# Patient Record
Sex: Male | Born: 1935 | Race: White | Hispanic: No | Marital: Married | Smoking: Never smoker
Health system: Southern US, Community
[De-identification: ages and names within clinical notes are randomized; demographics above are authoritative.]

## PROBLEM LIST (undated history)

## (undated) DIAGNOSIS — R413 Other amnesia: Secondary | ICD-10-CM

## (undated) DIAGNOSIS — F32A Depression, unspecified: Secondary | ICD-10-CM

## (undated) DIAGNOSIS — D696 Thrombocytopenia, unspecified: Secondary | ICD-10-CM

## (undated) DIAGNOSIS — E785 Hyperlipidemia, unspecified: Secondary | ICD-10-CM

## (undated) DIAGNOSIS — F0390 Unspecified dementia without behavioral disturbance: Secondary | ICD-10-CM

## (undated) DIAGNOSIS — F329 Major depressive disorder, single episode, unspecified: Secondary | ICD-10-CM

## (undated) DIAGNOSIS — Z741 Need for assistance with personal care: Secondary | ICD-10-CM

## (undated) DIAGNOSIS — E039 Hypothyroidism, unspecified: Secondary | ICD-10-CM

## (undated) HISTORY — PX: RETINAL DETACHMENT SURGERY: SHX105

## (undated) HISTORY — PX: TONSILLECTOMY: SUR1361

## (undated) HISTORY — PX: CHOLECYSTECTOMY: SHX55

---

## 2004-06-26 ENCOUNTER — Ambulatory Visit (HOSPITAL_COMMUNITY): Admission: RE | Admit: 2004-06-26 | Discharge: 2004-06-26 | Payer: Self-pay | Admitting: Family Medicine

## 2004-12-25 ENCOUNTER — Emergency Department (HOSPITAL_COMMUNITY): Admission: EM | Admit: 2004-12-25 | Discharge: 2004-12-25 | Payer: Self-pay | Admitting: Emergency Medicine

## 2009-04-03 ENCOUNTER — Encounter (INDEPENDENT_AMBULATORY_CARE_PROVIDER_SITE_OTHER): Payer: Self-pay | Admitting: *Deleted

## 2009-04-29 ENCOUNTER — Ambulatory Visit: Payer: Self-pay | Admitting: Gastroenterology

## 2009-04-29 DIAGNOSIS — K921 Melena: Secondary | ICD-10-CM

## 2009-05-27 ENCOUNTER — Ambulatory Visit: Payer: Self-pay | Admitting: Gastroenterology

## 2009-05-27 ENCOUNTER — Ambulatory Visit (HOSPITAL_COMMUNITY): Admission: RE | Admit: 2009-05-27 | Discharge: 2009-05-27 | Payer: Self-pay | Admitting: Gastroenterology

## 2009-07-30 ENCOUNTER — Ambulatory Visit: Payer: Self-pay | Admitting: Gastroenterology

## 2009-07-30 DIAGNOSIS — D6489 Other specified anemias: Secondary | ICD-10-CM

## 2009-08-15 LAB — CONVERTED CEMR LAB
Ferritin: 15 ng/mL — ABNORMAL LOW (ref 22–322)
HCT: 40 % (ref 39.0–52.0)
Hemoglobin: 12.7 g/dL — ABNORMAL LOW (ref 13.0–17.0)

## 2010-07-29 NOTE — Assessment & Plan Note (Signed)
Summary: Iron Deficiency Anemia   Visit Type:  Follow-up Visit Primary Care Provider:  Dwana Brock, M.D.  Chief Complaint:  F/U anemia.  History of Present Illness: No BRBPR, blk tarry stools, or hematemesis. No blood donation, nausea, vomiting, problems swallowing, or abd pain.  Current Medications (verified): 1)  Zoloft 100 Mg Tabs (Sertraline Hcl) .... Take 1 Tablet By Mouth Once A Day 2)  Zocor 20 Mg Tabs (Simvastatin) .... Once Daily 3)  Levothyroxine Sodium 50 Mcg Tabs (Levothyroxine Sodium) .... Once Daily  Allergies (verified): No Known Drug Allergies  Past History:  Past Medical History: TCS NOV 2010: Simple ADENOMA Hyperlipidemia Hypothyroidism Depression  Review of Systems       FEB 2011: HB 12.7 FERRITIN 15  Vital Signs:  Patient profile:   77 year old male Height:      71 inches Weight:      219 pounds BMI:     30.65 Temp:     98.3 degrees F oral Pulse rate:   72 / minute BP sitting:   120 / 62  (left arm) Cuff size:   regular  Vitals Entered By: Cloria Spring LPN (July 30, 2009 9:02 AM)  Physical Exam  General:  Well developed, well nourished, no acute distress. Head:  Normocephalic and atraumatic. Lungs:  Clear throughout to auscultation. Heart:  Regular rate and rhythm; no murmurs Abdomen:  Soft, nontender and nondistended. Midline hernia noted. Normal bowel sounds.  Impression & Recommendations:  Problem # 1:  OTHER SPECIFIED ANEMIAS (ICD-285.8) Assessment Unchanged No active GIB. Recheck labs. OPV as needed. Will call pt at home with results. If Hb low, willl encourage pt to have an EGD.  CC: PCP  Orders: T-Hemoglobin and Hematocrit (1005) T-Ferritin ([id number]) Est. Patient Level II (09811)  ADDENDUM 2311: CALLED pt Hb and iron stores remain low. Recommended EGD +/- capsule. Pt will call me back to schedule. Pt asked not to donated blood.     Appended Document: Iron Deficiency Anemia SEP 2010 CR 1.06 NL HFP HB 12 MCV 87.5  PLT 164  Appended Document: Iron Deficiency Anemia 224 LBS SEP 2010

## 2014-07-17 DIAGNOSIS — E782 Mixed hyperlipidemia: Secondary | ICD-10-CM | POA: Diagnosis not present

## 2014-07-17 DIAGNOSIS — E039 Hypothyroidism, unspecified: Secondary | ICD-10-CM | POA: Diagnosis not present

## 2014-12-06 DIAGNOSIS — E782 Mixed hyperlipidemia: Secondary | ICD-10-CM | POA: Diagnosis not present

## 2014-12-06 DIAGNOSIS — E039 Hypothyroidism, unspecified: Secondary | ICD-10-CM | POA: Diagnosis not present

## 2014-12-07 DIAGNOSIS — D696 Thrombocytopenia, unspecified: Secondary | ICD-10-CM | POA: Diagnosis not present

## 2014-12-07 DIAGNOSIS — F331 Major depressive disorder, recurrent, moderate: Secondary | ICD-10-CM | POA: Diagnosis not present

## 2014-12-07 DIAGNOSIS — G3184 Mild cognitive impairment, so stated: Secondary | ICD-10-CM | POA: Diagnosis not present

## 2014-12-07 DIAGNOSIS — E782 Mixed hyperlipidemia: Secondary | ICD-10-CM | POA: Diagnosis not present

## 2014-12-07 DIAGNOSIS — E039 Hypothyroidism, unspecified: Secondary | ICD-10-CM | POA: Diagnosis not present

## 2015-01-11 DIAGNOSIS — F331 Major depressive disorder, recurrent, moderate: Secondary | ICD-10-CM | POA: Diagnosis not present

## 2015-01-11 DIAGNOSIS — D696 Thrombocytopenia, unspecified: Secondary | ICD-10-CM | POA: Diagnosis not present

## 2015-01-11 DIAGNOSIS — G3184 Mild cognitive impairment, so stated: Secondary | ICD-10-CM | POA: Diagnosis not present

## 2015-01-11 DIAGNOSIS — E039 Hypothyroidism, unspecified: Secondary | ICD-10-CM | POA: Diagnosis not present

## 2015-02-14 DIAGNOSIS — G3184 Mild cognitive impairment, so stated: Secondary | ICD-10-CM | POA: Diagnosis not present

## 2015-02-14 DIAGNOSIS — E039 Hypothyroidism, unspecified: Secondary | ICD-10-CM | POA: Diagnosis not present

## 2015-03-13 DIAGNOSIS — Z23 Encounter for immunization: Secondary | ICD-10-CM | POA: Diagnosis not present

## 2015-04-22 DIAGNOSIS — E039 Hypothyroidism, unspecified: Secondary | ICD-10-CM | POA: Diagnosis not present

## 2015-04-22 DIAGNOSIS — G3184 Mild cognitive impairment, so stated: Secondary | ICD-10-CM | POA: Diagnosis not present

## 2015-04-22 DIAGNOSIS — E782 Mixed hyperlipidemia: Secondary | ICD-10-CM | POA: Diagnosis not present

## 2015-04-22 DIAGNOSIS — D696 Thrombocytopenia, unspecified: Secondary | ICD-10-CM | POA: Diagnosis not present

## 2015-04-22 DIAGNOSIS — E785 Hyperlipidemia, unspecified: Secondary | ICD-10-CM | POA: Diagnosis not present

## 2015-08-05 DIAGNOSIS — E039 Hypothyroidism, unspecified: Secondary | ICD-10-CM | POA: Diagnosis not present

## 2015-08-05 DIAGNOSIS — E782 Mixed hyperlipidemia: Secondary | ICD-10-CM | POA: Diagnosis not present

## 2015-08-07 DIAGNOSIS — G3184 Mild cognitive impairment, so stated: Secondary | ICD-10-CM | POA: Diagnosis not present

## 2015-08-07 DIAGNOSIS — D696 Thrombocytopenia, unspecified: Secondary | ICD-10-CM | POA: Diagnosis not present

## 2015-08-07 DIAGNOSIS — E039 Hypothyroidism, unspecified: Secondary | ICD-10-CM | POA: Diagnosis not present

## 2015-08-07 DIAGNOSIS — E782 Mixed hyperlipidemia: Secondary | ICD-10-CM | POA: Diagnosis not present

## 2015-08-07 DIAGNOSIS — R945 Abnormal results of liver function studies: Secondary | ICD-10-CM | POA: Diagnosis not present

## 2015-08-22 DIAGNOSIS — B351 Tinea unguium: Secondary | ICD-10-CM | POA: Diagnosis not present

## 2015-08-22 DIAGNOSIS — L281 Prurigo nodularis: Secondary | ICD-10-CM | POA: Diagnosis not present

## 2015-08-22 DIAGNOSIS — L603 Nail dystrophy: Secondary | ICD-10-CM | POA: Diagnosis not present

## 2015-09-10 DIAGNOSIS — E782 Mixed hyperlipidemia: Secondary | ICD-10-CM | POA: Diagnosis not present

## 2015-09-10 DIAGNOSIS — Z9181 History of falling: Secondary | ICD-10-CM | POA: Diagnosis not present

## 2015-09-10 DIAGNOSIS — D696 Thrombocytopenia, unspecified: Secondary | ICD-10-CM | POA: Diagnosis not present

## 2015-09-10 DIAGNOSIS — G3184 Mild cognitive impairment, so stated: Secondary | ICD-10-CM | POA: Diagnosis not present

## 2015-09-10 DIAGNOSIS — R945 Abnormal results of liver function studies: Secondary | ICD-10-CM | POA: Diagnosis not present

## 2015-09-10 DIAGNOSIS — E039 Hypothyroidism, unspecified: Secondary | ICD-10-CM | POA: Diagnosis not present

## 2015-09-14 DIAGNOSIS — R69 Illness, unspecified: Secondary | ICD-10-CM | POA: Diagnosis not present

## 2015-09-28 ENCOUNTER — Emergency Department (HOSPITAL_COMMUNITY): Payer: Medicare Other

## 2015-09-28 ENCOUNTER — Encounter (HOSPITAL_COMMUNITY): Payer: Self-pay | Admitting: Emergency Medicine

## 2015-09-28 ENCOUNTER — Inpatient Hospital Stay (HOSPITAL_COMMUNITY)
Admission: EM | Admit: 2015-09-28 | Discharge: 2015-10-03 | DRG: 312 | Disposition: A | Discharge status: Skilled Nursing Facility | Payer: Medicare Other | Attending: Internal Medicine | Admitting: Internal Medicine

## 2015-09-28 DIAGNOSIS — E038 Other specified hypothyroidism: Secondary | ICD-10-CM | POA: Diagnosis not present

## 2015-09-28 DIAGNOSIS — R402431 Glasgow coma scale score 3-8, in the field [EMT or ambulance]: Secondary | ICD-10-CM | POA: Diagnosis not present

## 2015-09-28 DIAGNOSIS — S299XXA Unspecified injury of thorax, initial encounter: Secondary | ICD-10-CM | POA: Diagnosis not present

## 2015-09-28 DIAGNOSIS — E785 Hyperlipidemia, unspecified: Secondary | ICD-10-CM | POA: Diagnosis not present

## 2015-09-28 DIAGNOSIS — E860 Dehydration: Secondary | ICD-10-CM | POA: Diagnosis not present

## 2015-09-28 DIAGNOSIS — F329 Major depressive disorder, single episode, unspecified: Secondary | ICD-10-CM | POA: Diagnosis present

## 2015-09-28 DIAGNOSIS — Z23 Encounter for immunization: Secondary | ICD-10-CM

## 2015-09-28 DIAGNOSIS — E039 Hypothyroidism, unspecified: Secondary | ICD-10-CM | POA: Diagnosis present

## 2015-09-28 DIAGNOSIS — F0390 Unspecified dementia without behavioral disturbance: Secondary | ICD-10-CM | POA: Diagnosis not present

## 2015-09-28 DIAGNOSIS — R29818 Other symptoms and signs involving the nervous system: Secondary | ICD-10-CM | POA: Diagnosis not present

## 2015-09-28 DIAGNOSIS — D61818 Other pancytopenia: Secondary | ICD-10-CM | POA: Diagnosis present

## 2015-09-28 DIAGNOSIS — G9340 Encephalopathy, unspecified: Secondary | ICD-10-CM | POA: Diagnosis not present

## 2015-09-28 DIAGNOSIS — D696 Thrombocytopenia, unspecified: Secondary | ICD-10-CM | POA: Diagnosis not present

## 2015-09-28 DIAGNOSIS — R220 Localized swelling, mass and lump, head: Secondary | ICD-10-CM | POA: Diagnosis not present

## 2015-09-28 DIAGNOSIS — R55 Syncope and collapse: Principal | ICD-10-CM | POA: Diagnosis present

## 2015-09-28 DIAGNOSIS — F0391 Unspecified dementia with behavioral disturbance: Secondary | ICD-10-CM | POA: Diagnosis not present

## 2015-09-28 DIAGNOSIS — R1111 Vomiting without nausea: Secondary | ICD-10-CM | POA: Diagnosis not present

## 2015-09-28 DIAGNOSIS — R569 Unspecified convulsions: Secondary | ICD-10-CM

## 2015-09-28 DIAGNOSIS — R413 Other amnesia: Secondary | ICD-10-CM | POA: Insufficient documentation

## 2015-09-28 DIAGNOSIS — E876 Hypokalemia: Secondary | ICD-10-CM | POA: Diagnosis not present

## 2015-09-28 DIAGNOSIS — R4182 Altered mental status, unspecified: Secondary | ICD-10-CM | POA: Diagnosis not present

## 2015-09-28 DIAGNOSIS — S0990XA Unspecified injury of head, initial encounter: Secondary | ICD-10-CM | POA: Diagnosis not present

## 2015-09-28 DIAGNOSIS — Z741 Need for assistance with personal care: Secondary | ICD-10-CM | POA: Insufficient documentation

## 2015-09-28 HISTORY — DX: Hyperlipidemia, unspecified: E78.5

## 2015-09-28 HISTORY — DX: Major depressive disorder, single episode, unspecified: F32.9

## 2015-09-28 HISTORY — DX: Depression, unspecified: F32.A

## 2015-09-28 HISTORY — DX: Hypothyroidism, unspecified: E03.9

## 2015-09-28 HISTORY — DX: Other amnesia: R41.3

## 2015-09-28 HISTORY — DX: Need for assistance with personal care: Z74.1

## 2015-09-28 LAB — I-STAT CHEM 8, ED
BUN: 8 mg/dL (ref 6–20)
Calcium, Ion: 1.12 mmol/L — ABNORMAL LOW (ref 1.13–1.30)
Chloride: 101 mmol/L (ref 101–111)
Creatinine, Ser: 0.8 mg/dL (ref 0.61–1.24)
GLUCOSE: 105 mg/dL — AB (ref 65–99)
HEMATOCRIT: 41 % (ref 39.0–52.0)
HEMOGLOBIN: 13.9 g/dL (ref 13.0–17.0)
Potassium: 3.4 mmol/L — ABNORMAL LOW (ref 3.5–5.1)
Sodium: 139 mmol/L (ref 135–145)
TCO2: 25 mmol/L (ref 0–100)

## 2015-09-28 LAB — CBC WITH DIFFERENTIAL/PLATELET
Basophils Absolute: 0.1 K/uL (ref 0.0–0.1)
Basophils Relative: 1 %
Eosinophils Absolute: 0.3 K/uL (ref 0.0–0.7)
Eosinophils Relative: 4 %
HCT: 39.7 % (ref 39.0–52.0)
Hemoglobin: 13 g/dL (ref 13.0–17.0)
Lymphocytes Relative: 53 %
Lymphs Abs: 3.1 K/uL (ref 0.7–4.0)
MCH: 31.3 pg (ref 26.0–34.0)
MCHC: 32.7 g/dL (ref 30.0–36.0)
MCV: 95.4 fL (ref 78.0–100.0)
Monocytes Absolute: 0.5 K/uL (ref 0.1–1.0)
Monocytes Relative: 8 %
Neutro Abs: 2 K/uL (ref 1.7–7.7)
Neutrophils Relative %: 34 %
Platelets: 67 K/uL — ABNORMAL LOW (ref 150–400)
RBC: 4.16 MIL/uL — ABNORMAL LOW (ref 4.22–5.81)
RDW: 15.8 % — ABNORMAL HIGH (ref 11.5–15.5)
WBC: 5.8 K/uL (ref 4.0–10.5)

## 2015-09-28 LAB — URINALYSIS, ROUTINE W REFLEX MICROSCOPIC
Bilirubin Urine: NEGATIVE
Glucose, UA: NEGATIVE mg/dL
Ketones, ur: NEGATIVE mg/dL
Leukocytes, UA: NEGATIVE
NITRITE: NEGATIVE
Protein, ur: NEGATIVE mg/dL
Specific Gravity, Urine: 1.015 (ref 1.005–1.030)
pH: 5 (ref 5.0–8.0)

## 2015-09-28 LAB — RAPID URINE DRUG SCREEN, HOSP PERFORMED
Amphetamines: NOT DETECTED
Barbiturates: NOT DETECTED
Benzodiazepines: NOT DETECTED
COCAINE: NOT DETECTED
Opiates: NOT DETECTED
Tetrahydrocannabinol: NOT DETECTED

## 2015-09-28 LAB — COMPREHENSIVE METABOLIC PANEL
ALK PHOS: 178 U/L — AB (ref 38–126)
ALT: 25 U/L (ref 17–63)
AST: 51 U/L — ABNORMAL HIGH (ref 15–41)
Albumin: 3.5 g/dL (ref 3.5–5.0)
Anion gap: 13 (ref 5–15)
BUN: 9 mg/dL (ref 6–20)
CO2: 20 mmol/L — ABNORMAL LOW (ref 22–32)
CREATININE: 0.85 mg/dL (ref 0.61–1.24)
Calcium: 8.4 mg/dL — ABNORMAL LOW (ref 8.9–10.3)
Chloride: 105 mmol/L (ref 101–111)
GFR calc non Af Amer: >60 mL/min (ref >60)
Glucose, Bld: 107 mg/dL — ABNORMAL HIGH (ref 65–99)
Potassium: 3.5 mmol/L (ref 3.5–5.1)
Sodium: 138 mmol/L (ref 135–145)
Total Bilirubin: 0.8 mg/dL (ref 0.3–1.2)
Total Protein: 6.4 g/dL — ABNORMAL LOW (ref 6.5–8.1)

## 2015-09-28 LAB — ETHANOL: Alcohol, Ethyl (B): <5 mg/dL (ref <5)

## 2015-09-28 LAB — CBG MONITORING, ED: Glucose-Capillary: 100 mg/dL — ABNORMAL HIGH (ref 65–99)

## 2015-09-28 LAB — PROTIME-INR
INR: 1.03 (ref 0.00–1.49)
Prothrombin Time: 13.7 seconds (ref 11.6–15.2)

## 2015-09-28 LAB — CK: Total CK: 57 U/L (ref 49–397)

## 2015-09-28 LAB — URINE MICROSCOPIC-ADD ON

## 2015-09-28 LAB — I-STAT TROPONIN, ED: Troponin i, poc: 0.01 ng/mL (ref 0.00–0.08)

## 2015-09-28 MED ORDER — ONDANSETRON HCL 4 MG/2ML IJ SOLN: 4.0000 mg | Freq: Four times a day (QID) | INTRAMUSCULAR | Status: DC | PRN

## 2015-09-28 MED ORDER — SODIUM CHLORIDE 0.9 % IV SOLN
INTRAVENOUS | Status: DC
  Administered 2015-09-28: 19:00:00 via INTRAVENOUS

## 2015-09-28 MED ORDER — LEVETIRACETAM IN NACL 500 MG/100ML IV SOLN
INTRAVENOUS | Status: AC
  Filled 2015-09-28: qty 100

## 2015-09-28 MED ORDER — TETANUS-DIPHTH-ACELL PERTUSSIS 5-2.5-18.5 LF-MCG/0.5 IM SUSP
0.5000 mL | Freq: Once | INTRAMUSCULAR | Status: AC
  Administered 2015-09-28: 0.5 mL via INTRAMUSCULAR
  Filled 2015-09-28: qty 0.5

## 2015-09-28 MED ORDER — ENOXAPARIN SODIUM 40 MG/0.4ML ~~LOC~~ SOLN
40.0000 mg | SUBCUTANEOUS | Status: DC
  Administered 2015-09-29: 40 mg via SUBCUTANEOUS
  Filled 2015-09-28: qty 0.4

## 2015-09-28 MED ORDER — SODIUM CHLORIDE 0.9 % IV SOLN
75.0000 mL/h | INTRAVENOUS | Status: DC
  Administered 2015-09-28 – 2015-10-02 (×7): 75 mL/h via INTRAVENOUS

## 2015-09-28 MED ORDER — ONDANSETRON HCL 4 MG PO TABS: 4.0000 mg | ORAL_TABLET | Freq: Four times a day (QID) | ORAL | Status: DC | PRN

## 2015-09-28 MED ORDER — ASPIRIN 325 MG PO TABS
325.0000 mg | ORAL_TABLET | Freq: Every day | ORAL | Status: DC
  Administered 2015-09-29 – 2015-10-03 (×5): 325 mg via ORAL
  Filled 2015-09-28 (×5): qty 1

## 2015-09-28 MED ORDER — SODIUM CHLORIDE 0.9 % IV SOLN
500.0000 mg | Freq: Two times a day (BID) | INTRAVENOUS | Status: DC
  Administered 2015-09-29 – 2015-10-02 (×7): 500 mg via INTRAVENOUS
  Filled 2015-09-28 (×9): qty 5

## 2015-09-28 MED ORDER — SODIUM CHLORIDE 0.9 % IV SOLN
500.0000 mg | Freq: Once | INTRAVENOUS | Status: AC
  Administered 2015-09-28: 500 mg via INTRAVENOUS
  Filled 2015-09-28: qty 5

## 2015-09-28 MED ORDER — SERTRALINE HCL 100 MG PO TABS
100.0000 mg | ORAL_TABLET | Freq: Every day | ORAL | Status: DC
  Administered 2015-09-29: 100 mg via ORAL
  Filled 2015-09-28: qty 1

## 2015-09-28 MED ORDER — LEVOTHYROXINE SODIUM 112 MCG PO TABS
112.0000 ug | ORAL_TABLET | Freq: Every day | ORAL | Status: DC
  Administered 2015-09-29 – 2015-10-03 (×5): 112 ug via ORAL
  Filled 2015-09-28 (×5): qty 1

## 2015-09-28 MED ORDER — FERROUS SULFATE 325 (65 FE) MG PO TABS
325.0000 mg | ORAL_TABLET | Freq: Every morning | ORAL | Status: DC
  Administered 2015-09-29 – 2015-10-03 (×5): 325 mg via ORAL
  Filled 2015-09-28 (×5): qty 1

## 2015-09-28 NOTE — H&P (Signed)
History and Physical  Trevor Brock ZOX:[id number] DOB: 1933/03/07 DOA: 09/28/2015  Referring physician: Dr Clarene Duke, ED physician PCP: No primary care provider on file.   Chief Complaint: Syncope  HPI: Trevor Brock is a 82 y.o. male  With baseline dementia, hypothyroidism, hyperlipidemia. The history is provided by the patient's wife due to dementia and the patient not being able to recall events. The patient had a syncopal episode approximately 4 PM.  The patient had chemotherapy home from dinner, went to the bathroom. His wife heard him fall. When she went into the bathroom he was vomiting and having "jerking motions". The patient was lying on his side with no witnessed aspirations. EMS was called and the patient was transported to the hospital. Length of episode is uncertain. When EMS arrived, the patient had right-sided gaze and left-sided weakness and a code stroke was called. Patient was brought to the hospital for evaluation and had a negative CT scan. Neuro telemetry consulted to the patient and noticed that gaze preference had resolved. No obvious facial asymmetry. Per the patient's family, the patient is not at baseline currently cognitively   Review of Systems:  Unable to assess  Past Medical History  Diagnosis Date  . Hyperlipidemia   . Depression   . Mild memory disturbance   . Assistance needed for mobility     walks with walker  . Hypothyroid    Past Surgical History  Procedure Laterality Date  . Cholecystectomy    . Tonsillectomy    . Retinal detachment surgery Right    Social History:  reports that he has never smoked. He does not have any smokeless tobacco history on file. He reports that he does not drink alcohol or use illicit drugs. Patient lives at home & is able to participate in activities of daily living  No Known Allergies  Family history of obesity  Prior to Admission medications   Medication Sig Start Date End Date Taking? Authorizing Provider   ferrous sulfate 325 (65 FE) MG tablet Take 325 mg by mouth every morning.   Yes Historical Provider, MD  levothyroxine (SYNTHROID, LEVOTHROID) 112 MCG tablet  09/15/15  Yes Historical Provider, MD  sertraline (ZOLOFT) 100 MG tablet  09/20/15  Yes Historical Provider, MD    Physical Exam: BP 146/58 mmHg  Pulse 72  Temp(Src) 97.7 F (36.5 C) (Oral)  Resp 16  Ht 6' (1.829 m)  Wt 99.338 kg (219 lb)  BMI 29.70 kg/m2  SpO2 95%  General: Elderly Caucasian male. Awake and alert and oriented to person. Patient is not oriented to place or time - which is not new. No acute cardiopulmonary distress.  Head: There is abrasions to the patient's right parietal scalp as well as left occipital scalp. Eyes: Pupils equal, round, reactive to light. Extraocular muscles are intact. Sclerae anicteric and noninjected.  ENT:  Moist mucosal membranes. No mucosal lesions.  Neck: Neck supple without lymphadenopathy. No carotid bruits. No masses palpated.  Cardiovascular: Regular rate with normal S1-S2 sounds. No murmurs, rubs, gallops auscultated. No JVD.  Respiratory: Good respiratory effort with no wheezes, rales, rhonchi. Lungs clear to auscultation bilaterally.  Abdomen: Soft, nontender, nondistended. Active bowel sounds. No masses or hepatosplenomegaly  Skin: Dry, warm to touch. 2+ dorsalis pedis and radial pulses. Musculoskeletal: No calf or leg pain. All major joints not erythematous nontender.  Psychiatric:  Unable to assess Neurologic: Patient unable to follow commands. Cranial nerves II through XII are grossly intact.  Labs on Admission:  Basic Metabolic Panel:  Recent Labs Lab 09/28/15 1700 09/28/15 1737  NA 138 139  K 3.5 3.4*  CL 105 101  CO2 20*  --   GLUCOSE 107* 105*  BUN 9 8  CREATININE 0.85 0.80  CALCIUM 8.4*  --    Liver Function Tests:  Recent Labs Lab 09/28/15 1700  AST 51*  ALT 25  ALKPHOS 178*  BILITOT 0.8  PROT 6.4*  ALBUMIN 3.5   No results for  input(s): LIPASE, AMYLASE in the last 168 hours. No results for input(s): AMMONIA in the last 168 hours. CBC:  Recent Labs Lab 09/28/15 1700 09/28/15 1737  WBC 5.8  --   NEUTROABS 2.0  --   HGB 13.0 13.9  HCT 39.7 41.0  MCV 95.4  --   PLT 67*  --    Cardiac Enzymes: No results for input(s): CKTOTAL, CKMB, CKMBINDEX, TROPONINI in the last 168 hours.  BNP (last 3 results) No results for input(s): BNP in the last 8760 hours.  ProBNP (last 3 results) No results for input(s): PROBNP in the last 8760 hours.  CBG:  Recent Labs Lab 09/28/15 1711  GLUCAP 100*    Radiological Exams on Admission: Ct Head Wo Contrast  09/28/2015  CLINICAL DATA:  Code stroke, left-sided weakness starting at 1630 hours, fall, laceration posterior head, seizure, vomiting EXAM: CT HEAD WITHOUT CONTRAST TECHNIQUE: Contiguous axial images were obtained from the base of the skull through the vertex without intravenous contrast. COMPARISON:  None. FINDINGS: Brain: There is mild generalized brain atrophy with commensurate dilatation of the ventricles and sulci. There is no mass, hemorrhage, edema or other evidence of acute parenchymal abnormality. Minimal chronic small vessel ischemic change noted within the deep periventricular white matter regions bilaterally. Vascular: No hyperdense vessel or unexpected calcification. Skull: Focal soft tissue edema/hematoma overlying the right frontal bone. No underlying fracture. Sinuses/Orbits: Visualized upper paranasal sinuses are clear. Mastoid air cells are clear. Other: None. IMPRESSION: 1. No acute intracranial abnormality. No intracranial mass, hemorrhage or edema. 2. Soft tissue edema/hematoma overlying the right frontal bone. No underlying fracture. These results were called by telephone at the time of interpretation on 09/28/2015 at 5:16 pm to Dr. Samuel Jester , who verbally acknowledged these results. Electronically Signed   By: Bary Richard M.D.   On: 09/28/2015 17:17    Dg Chest Port 1 View  09/28/2015  CLINICAL DATA:  Syncope, fall at 4 p.m. today with nausea vomiting since then, generalized weakness EXAM: PORTABLE CHEST 1 VIEW COMPARISON:  None. FINDINGS: Mild cardiac silhouette enlargement. Vascular pattern normal. Lungs clear. No consolidation effusion or pneumothorax. IMPRESSION: Mild cardiac enlargement otherwise no acute findings Electronically Signed   By: Esperanza Heir M.D.   On: 09/28/2015 17:31    EKG: Independently reviewed. Sinus rhythm with right bundle branch block and left anterior fascicular block. LVH. No acute ST elevation or depression.  Assessment/Plan Present on Admission:  . Syncope . Hypothyroid . Hyperlipidemia  This patient was discussed with the ED physician, including pertinent vitals, physical exam findings, labs, and imaging.  We also discussed care given by the ED provider.  #1 syncope  Ad[facility] to [facility] neuro telemetry  Question whether this is stroke versus seizure - certainly with the tonic-clonic motions, seizure is more evident  Consult neurology  MRI of the brain with and without contrast  We'll obtain CPK level  Continue Keppra 500 mg IV every 12 hours  Repeat CBC and metabolic panel in  the morning  We'll keep nothing by mouth for now - change to cardiac diet when passes swallow screen  Start daily aspirin #2 hypothyroidism  Continue levothyroxine #3 hyperlipidemia  DVT prophylaxis: Lovenox  Consultants: Neurology  Code Status: Full code per family  Family Communication: Wife and 3 children in the room   Disposition Plan: Admission to Trevor Brock,   Trevor Cumpian J Keevon Henney, DO [facility] Pager [phone]

## 2015-09-28 NOTE — ED Notes (Signed)
Family at bedside. 

## 2015-09-28 NOTE — ED Notes (Signed)
Pt wife states he heard him fall around 1600 today.

## 2015-09-28 NOTE — ED Notes (Signed)
Per EMS, pt has dementia and wife told ems pt had a syncopal episode at approx 4:30 pm.  Reports pt woke up with a r sided gaze, left sided weakness, and appeared to have seizurelike activity.  Reports pt's speech incomprehensible, eyes open to verbal stimuli.  Pt vomited enroute and once at home.  Dr. Thurnell Garbe met ems in hallway and sent to ct.   CBG per ems 107, 02 sat 96%, bp 192/94, NSR on monitor.

## 2015-09-28 NOTE — Progress Notes (Signed)
1716: Code stroke exam results called to EDP 1717: code stroke results finalized in EPIC.

## 2015-09-28 NOTE — ED Notes (Signed)
neurotele in progress 

## 2015-09-28 NOTE — ED Provider Notes (Signed)
CSN: [id number]     Arrival date & time 09/28/15  1653 History   First MD Initiated Contact with Patient 09/28/15 1655     Chief Complaint  Patient presents with  . Code Stroke    An emergency department physician performed an initial assessment on this suspected stroke patient at 77.  The history is provided by the EMS personnel and the spouse. The history is limited by the condition of the patient (non-verbal).  Pt was seen at 1650. Pt seen on arrival to ED. Per EMS and pt's wife: Pt and wife went out to eat, came home, and pt went into bathroom approximately 1600. Pt's wife heard pt fall. Pt's wife found pt unresponsive and "shaking." EMS states pt woke up with right sided gaze, LUE and LLE weakness, aphasia. Pt had N/V x1 en route to the ED. EMS called Code Stroke.   Past Medical History  Diagnosis Date  . Hyperlipidemia   . Hypothyroid   . Depression   . Mild memory disturbance   . Assistance needed for mobility     walks with walker   Past Surgical History  Procedure Laterality Date  . Cholecystectomy    . Tonsillectomy    . Retinal detachment surgery Right     Social History  Substance Use Topics  . Smoking status: Never Smoker   . Smokeless tobacco: None  . Alcohol Use: No    Review of Systems  Unable to perform ROS: Patient nonverbal      Allergies  Review of patient's allergies indicates no known allergies.  Home Medications   Prior to Admission medications   Not on File   BP 135/54 mmHg  Pulse 81  Temp(Src) 97.7 F (36.5 C) (Oral)  Resp 19  Ht 6' (1.829 m)  Wt 219 lb (99.338 kg)  BMI 29.70 kg/m2  SpO2 100% Physical Exam  1650: Physical examination:  Nursing notes reviewed; Vital signs and O2 SAT reviewed;  Constitutional: Well developed, Well nourished, In no acute distress; Head:  Normocephalic, +abrasion and ecchymosis posterior scalp, hemostatic..; Eyes: EOMI, PERRL, No scleral icterus; ENMT: Mouth and pharynx normal, Mucous membranes dry;  Neck: Supple, Full range of motion, No lymphadenopathy; Cardiovascular: Regular rate and rhythm, No gallop; Respiratory: Breath sounds clear & equal bilaterally, No wheezes.  Normal respiratory effort/excursion; Chest: Nontender, Movement normal; Abdomen: Soft, Nontender, Nondistended, Normal bowel sounds; Genitourinary: No CVA tenderness; Spine:  No midline CS, TS, LS tenderness.;; Extremities: Pulses normal, No deformity, Pelvis stable. No edema, No calf edema or asymmetry.; Neuro: Awake, alert. Right gaze. No facial droop. Non-verbal. Eyes open spontaneously. Pt will move RUE and RLE spontaneously, strong grip. LUE and LLE without spontaneous movement, pt will drop LUE and LLE onto stretcher when lifted by ED staff; will not perform grip with left hand.; Skin: Color normal, Warm, Dry.   ED Course  Procedures (including critical care time) Labs Review  Imaging Review  I have personally reviewed and evaluated these images and lab results as part of my medical decision-making.   EKG Interpretation   Date/Time:  Saturday September 28 2015 17:11:22 EDT Ventricular Rate:  91 PR Interval:  130 QRS Duration: 107 QT Interval:  400 QTC Calculation: 492 R Axis:   -38 Text Interpretation:  Sinus rhythm Left axis deviation Incomplete RBBB and  LAFB Abnormal R-wave progression, early transition Left ventricular  hypertrophy Borderline prolonged QT interval No old tracing to compare  Confirmed by [facility]  MD, Nicholos Johns [phone]) on 09/28/2015 5:16:59 PM  MDM  MDM Reviewed: previous chart, nursing note and vitals Interpretation: labs, ECG, x-ray and CT scan Total time providing critical care: 30-74 minutes. This excludes time spent performing separately reportable procedures and services. Consults: neurology and admitting MD    CRITICAL CARE Performed by: Laray Anger Total critical care time: 35 minutes Critical care time was exclusive of separately billable procedures and treating other  patients. Critical care was necessary to treat or prevent imminent or life-threatening deterioration. Critical care was time spent personally by me on the following activities: development of treatment plan with patient and/or surrogate as well as nursing, discussions with consultants, evaluation of patient's response to treatment, examination of patient, obtaining history from patient or surrogate, ordering and performing treatments and interventions, ordering and review of laboratory studies, ordering and review of radiographic studies, pulse oximetry and re-evaluation of patient's condition.   Results for orders placed or performed during the hospital encounter of 09/28/15  Ethanol  Result Value Ref Range   Alcohol, Ethyl (B) <5 <5 mg/dL  Protime-INR  Result Value Ref Range   Prothrombin Time 13.7 11.6 - 15.2 seconds   INR 1.03 0.00 - 1.49  Comprehensive metabolic panel  Result Value Ref Range   Sodium 138 135 - 145 mmol/L   Potassium 3.5 3.5 - 5.1 mmol/L   Chloride 105 101 - 111 mmol/L   CO2 20 (L) 22 - 32 mmol/L   Glucose, Bld 107 (H) 65 - 99 mg/dL   BUN 9 6 - 20 mg/dL   Creatinine, Ser 1.61 0.61 - 1.24 mg/dL   Calcium 8.4 (L) 8.9 - 10.3 mg/dL   Total Protein 6.4 (L) 6.5 - 8.1 g/dL   Albumin 3.5 3.5 - 5.0 g/dL   AST 51 (H) 15 - 41 U/L   ALT 25 17 - 63 U/L   Alkaline Phosphatase 178 (H) 38 - 126 U/L   Total Bilirubin 0.8 0.3 - 1.2 mg/dL   GFR calc non Af Amer >60 >60 mL/min   GFR calc Af Amer >60 >60 mL/min   Anion gap 13 5 - 15  Urine rapid drug screen (hosp performed)not at [facility]  Result Value Ref Range   Opiates NONE DETECTED NONE DETECTED   Cocaine NONE DETECTED NONE DETECTED   Benzodiazepines NONE DETECTED NONE DETECTED   Amphetamines NONE DETECTED NONE DETECTED   Tetrahydrocannabinol NONE DETECTED NONE DETECTED   Barbiturates NONE DETECTED NONE DETECTED  Urinalysis, Routine w reflex microscopic (not at [facility])  Result Value Ref Range   Color, Urine YELLOW YELLOW    APPearance CLEAR CLEAR   Specific Gravity, Urine 1.015 1.005 - 1.030   pH 5.0 5.0 - 8.0   Glucose, UA NEGATIVE NEGATIVE mg/dL   Hgb urine dipstick MODERATE (A) NEGATIVE   Bilirubin Urine NEGATIVE NEGATIVE   Ketones, ur NEGATIVE NEGATIVE mg/dL   Protein, ur NEGATIVE NEGATIVE mg/dL   Nitrite NEGATIVE NEGATIVE   Leukocytes, UA NEGATIVE NEGATIVE  CBC WITH DIFFERENTIAL  Result Value Ref Range   WBC 5.8 4.0 - 10.5 K/uL   RBC 4.16 (L) 4.22 - 5.81 MIL/uL   Hemoglobin 13.0 13.0 - 17.0 g/dL   HCT 09.6 04.5 - 40.9 %   MCV 95.4 78.0 - 100.0 fL   MCH 31.3 26.0 - 34.0 pg   MCHC 32.7 30.0 - 36.0 g/dL   RDW 81.1 (H) 91.4 - 78.2 %   Platelets 67 (L) 150 - 400 K/uL   Neutrophils Relative % 34 %   Neutro Abs 2.0 1.7 -  7.7 K/uL   Lymphocytes Relative 53 %   Lymphs Abs 3.1 0.7 - 4.0 K/uL   Monocytes Relative 8 %   Monocytes Absolute 0.5 0.1 - 1.0 K/uL   Eosinophils Relative 4 %   Eosinophils Absolute 0.3 0.0 - 0.7 K/uL   Basophils Relative 1 %   Basophils Absolute 0.1 0.0 - 0.1 K/uL  Urine microscopic-add on  Result Value Ref Range   Squamous Epithelial / LPF 0-5 (A) NONE SEEN   WBC, UA 0-5 0 - 5 WBC/hpf   RBC / HPF 6-30 0 - 5 RBC/hpf   Bacteria, UA FEW (A) NONE SEEN  I-Stat Chem 8, ED  (not at [facility], [facility])  Result Value Ref Range   Sodium 139 135 - 145 mmol/L   Potassium 3.4 (L) 3.5 - 5.1 mmol/L   Chloride 101 101 - 111 mmol/L   BUN 8 6 - 20 mg/dL   Creatinine, Ser 9.600.80 0.61 - 1.24 mg/dL   Glucose, Bld 454105 (H) 65 - 99 mg/dL   Calcium, Ion 0.981.12 (L) 1.13 - 1.30 mmol/L   TCO2 25 0 - 100 mmol/L   Hemoglobin 13.9 13.0 - 17.0 g/dL   HCT 11.941.0 14.739.0 - 82.952.0 %  I-stat troponin, ED (not at [facility], [facility])  Result Value Ref Range   Troponin i, poc 0.01 0.00 - 0.08 ng/mL   Comment 3          CBG monitoring, ED  Result Value Ref Range   Glucose-Capillary 100 (H) 65 - 99 mg/dL   Ct Head Wo Contrast 5/6/21304/06/2015  CLINICAL DATA:  Code stroke, left-sided weakness starting at 1630 hours, fall, laceration  posterior head, seizure, vomiting EXAM: CT HEAD WITHOUT CONTRAST TECHNIQUE: Contiguous axial images were obtained from the base of the skull through the vertex without intravenous contrast. COMPARISON:  None. FINDINGS: Brain: There is mild generalized brain atrophy with commensurate dilatation of the ventricles and sulci. There is no mass, hemorrhage, edema or other evidence of acute parenchymal abnormality. Minimal chronic small vessel ischemic change noted within the deep periventricular white matter regions bilaterally. Vascular: No hyperdense vessel or unexpected calcification. Skull: Focal soft tissue edema/hematoma overlying the right frontal bone. No underlying fracture. Sinuses/Orbits: Visualized upper paranasal sinuses are clear. Mastoid air cells are clear. Other: None. IMPRESSION: 1. No acute intracranial abnormality. No intracranial mass, hemorrhage or edema. 2. Soft tissue edema/hematoma overlying the right frontal bone. No underlying fracture. These results were called by telephone at the time of interpretation on 09/28/2015 at 5:16 pm to Dr. Samuel JesterKATHLEEN Terril Amaro , who verbally acknowledged these results. Electronically Signed   By: Bary RichardStan  Maynard M.D.   On: 09/28/2015 17:17   Dg Chest Port 1 View 09/28/2015  CLINICAL DATA:  Syncope, fall at 4 p.m. today with nausea vomiting since then, generalized weakness EXAM: PORTABLE CHEST 1 VIEW COMPARISON:  None. FINDINGS: Mild cardiac silhouette enlargement. Vascular pattern normal. Lungs clear. No consolidation effusion or pneumothorax. IMPRESSION: Mild cardiac enlargement otherwise no acute findings Electronically Signed   By: Esperanza Heiraymond  Rubner M.D.   On: 09/28/2015 17:31    1720:  [facility] MD: pt has been evaluated, states pt's gaze has now resolved, this episode was likely seizure (vs stroke), recommends to start IV keppra 500mg  now and q12h, admit for MRI brain and in-house Neuro consult.   1810:  Wound care to scalp abrasion, Td updated. Pt now  talking in clear voice with family at bedside, A&O. Dx and testing d/w pt and family.  Questions answered.  Verb understanding, agreeable to admit. T/C to [facility]. Adrian Blackwater, case discussed, including:  HPI, pertinent PM/SHx, VS/PE, dx testing, ED course and treatment:  Agreeable to facilitate admission to [facility], requests to write temporary orders, obtain tele bed to team [facility].   Samuel Jester, DO 09/29/15 2133

## 2015-09-28 NOTE — ED Notes (Signed)
Wife reports pt went to the bathroom at approx 4:00 and pt fell.  Wife says pt has fallen several times in the past few months.  Reports wife found pt laying in the floor and when she tried to talk to him pt started vomiting.  Wife called 911.  Prior to pt going to the restroom, pt had been "fine."  Wife says they had just gotten back from going out to eat.  Wife says pt has some confusion but has not been diagnosed with dementia or alzheimer's.  Wife says pt usually alert, speaking clearly, and ambulates with a walker.

## 2015-09-28 NOTE — ED Notes (Signed)
Pt is sitting up, answering questions appropriately and speaking clearly.

## 2015-09-28 NOTE — Progress Notes (Signed)
1645 beeper: patient coming in for code stroke 1646 call: patient 5 minutes out 1657: EMS and RN in CT room with patient 1658: lab in CT 1700: SOC called wanting images/ lab leaving CT room 1703: Start CT 1707: patient going back to ER with RN 1708: completed exam in EPIC 1709: called [facility] to make them aware of code stroke.

## 2015-09-28 NOTE — ED Notes (Signed)
Wound care performed to head

## 2015-09-29 ENCOUNTER — Observation Stay (HOSPITAL_COMMUNITY): Payer: Medicare Other

## 2015-09-29 DIAGNOSIS — F0390 Unspecified dementia without behavioral disturbance: Secondary | ICD-10-CM | POA: Diagnosis not present

## 2015-09-29 DIAGNOSIS — E785 Hyperlipidemia, unspecified: Secondary | ICD-10-CM

## 2015-09-29 DIAGNOSIS — R55 Syncope and collapse: Principal | ICD-10-CM

## 2015-09-29 DIAGNOSIS — E038 Other specified hypothyroidism: Secondary | ICD-10-CM | POA: Diagnosis not present

## 2015-09-29 DIAGNOSIS — S0990XA Unspecified injury of head, initial encounter: Secondary | ICD-10-CM | POA: Diagnosis not present

## 2015-09-29 DIAGNOSIS — R220 Localized swelling, mass and lump, head: Secondary | ICD-10-CM | POA: Diagnosis not present

## 2015-09-29 DIAGNOSIS — F0391 Unspecified dementia with behavioral disturbance: Secondary | ICD-10-CM | POA: Diagnosis not present

## 2015-09-29 DIAGNOSIS — R4182 Altered mental status, unspecified: Secondary | ICD-10-CM | POA: Diagnosis not present

## 2015-09-29 LAB — TECHNOLOGIST SMEAR REVIEW

## 2015-09-29 MED ORDER — WHITE PETROLATUM GEL
Status: AC
  Administered 2015-09-29: 0.2
  Filled 2015-09-29: qty 1

## 2015-09-29 MED ORDER — LORAZEPAM 2 MG/ML IJ SOLN
0.5000 mg | Freq: Once | INTRAMUSCULAR | Status: AC
  Administered 2015-09-29: 0.5 mg via INTRAVENOUS
  Filled 2015-09-29: qty 1

## 2015-09-29 MED ORDER — GADOBENATE DIMEGLUMINE 529 MG/ML IV SOLN
20.0000 mL | Freq: Once | INTRAVENOUS | Status: AC | PRN
  Administered 2015-09-29: 20 mL via INTRAVENOUS

## 2015-09-29 MED ORDER — SERTRALINE HCL 50 MG PO TABS
50.0000 mg | ORAL_TABLET | Freq: Every day | ORAL | Status: DC
  Administered 2015-09-30 – 2015-10-03 (×4): 50 mg via ORAL
  Filled 2015-09-29 (×5): qty 1

## 2015-09-29 NOTE — Consult Note (Signed)
Reason for Consult: AMS Referring Physician: Dr Lenise ArenaMeyers  CC: Altered mental status - possible seizures.  HPI: Trevor Brock is an 82 y.o. male with a history of dementia, hypothyroidism, and hyperlipidemia, [facility] 09/28/2015 after the patient fell in the bathroom at home and his wife found him vomiting with jerking motions. EMS was summoned. On arrival the patient was noted to have a right gaze preference with left hemiparesis. He was transported to [facility]. A code stroke was called and the initial CT scan was negative. An MRI of the brain showed - no acute intracranial process.The patient's gaze deficits and weakness resolved. The family reported ongoing cognitive changes. Neurology was asked to evaluate the patient. He has been started on Keppra.  All of the history was obtained from the patient's wife and the patient's chart. The patient was unable to give any reliable history. Initially he was unable to recognize his wife or his son and could not recall their names. His wife reports that he was placed on statin medications approximately 2 months ago. He is also on medications for his dementia. Since starting the statin medications he has become increasingly weak and lethargic. He has fallen 5 times since being on the statin medications. This culminated in the above-noted fall and seizure like activity. They also reported that his platelet count has been reported low. Several months ago he was driving and walking laps in the gym with his wife. No previous history of seizures.  Past Medical History  Diagnosis Date  . Hyperlipidemia   . Depression   . Mild memory disturbance   . Assistance needed for mobility     walks with walker  . Hypothyroid     Past Surgical History  Procedure Laterality Date  . Cholecystectomy    . Tonsillectomy    . Retinal detachment surgery Right     No family history on file.  Social History:  reports that he has never  smoked. He does not have any smokeless tobacco history on file. He reports that he does not drink alcohol or use illicit drugs.  No Known Allergies  Medications:  Scheduled: . aspirin  325 mg Oral Daily  . ferrous sulfate  325 mg Oral q morning - 10a  . levETIRAcetam  500 mg Intravenous Q12H  . levothyroxine  112 mcg Oral QAC breakfast  . sertraline  100 mg Oral Daily    ROS: History obtained from wife and son  General ROS: negative for - chills, fever, night sweats, weight gain or weight loss. Positive for fatigue, lethargy, poor appetite, and generalized weakness. Psychological ROS: negative for - behavioral disorder, hallucinations, memory difficulties, mood swings or suicidal ideation Ophthalmic ROS: negative for - blurry vision, double vision, eye pain or loss of vision ENT ROS: negative for - epistaxis, nasal discharge, oral lesions, sore throat, tinnitus or vertigo Allergy and Immunology ROS: negative for - hives or itchy/watery eyes Hematological and Lymphatic ROS: negative for - bleeding problems, bruising or swollen lymph nodes Endocrine ROS: negative for - galactorrhea, hair pattern changes, polydipsia/polyuria or temperature intolerance Respiratory ROS: negative for - cough, hemoptysis, shortness of breath or wheezing Cardiovascular ROS: negative for - chest pain, edema or irregular heartbeat. Positive for recent dyspnea on exertion. Gastrointestinal ROS: negative for - abdominal pain, diarrhea, hematemesis, nausea/vomiting or stool incontinence Genito-Urinary ROS: negative for - dysuria, hematuria, incontinence or urinary frequency/urgency Musculoskeletal ROS: negative for - joint swelling or muscular weakness Neurological ROS: as noted  in HPI Dermatological ROS: negative for rash and skin lesion changes   Physical Examination: Blood pressure 133/53, pulse 63, temperature 98.5 F (36.9 C), temperature source Oral, resp. rate 18, height 6' (1.829 m), weight 99.338 kg  (219 lb), SpO2 100 %.  Neurologic Examination (very difficult exam due to the patient's ability to cooperate)  Mental Status: Alert, at times but falls asleep easily. He is disoriented to person, time and place Speech fluent without evidence of aphasia.  Able to follow one-step commands but inconsistently Cranial Nerves: II: Discs not visualized; Visual fields could not be tested, pupils equal, round and reactive to light. III,IV, VI: ptosis not present, extra-ocular motions intact bilaterally V,VII: smile symmetric, facial light touch sensation normal bilaterally VIII: HOH IX,X: Gag reflex not tested XI: Patient unable to follow commands XII: midline tongue extension Motor: Right : Upper extremity   5/5    Left:     Upper extremity   5/5  Lower extremity   5/5     Lower extremity   5/5 Tone and bulk:normal tone throughout; no atrophy noted Sensory: light touch intact throughout, bilaterally Deep Tendon Reflexes: Able to test Plantars: Could not be assessed secondary to very sensitive feet Cerebellar: Patient unable to follow commands Gait: Not attempted secondary to weakness CV: pulses palpable throughout      Laboratory Studies:   Basic Metabolic Panel:  Recent Labs Lab 09/28/15 1700 09/28/15 1737  NA 138 139  K 3.5 3.4*  CL 105 101  CO2 20*  --   GLUCOSE 107* 105*  BUN 9 8  CREATININE 0.85 0.80  CALCIUM 8.4*  --     Liver Function Tests:  Recent Labs Lab 09/28/15 1700  AST 51*  ALT 25  ALKPHOS 178*  BILITOT 0.8  PROT 6.4*  ALBUMIN 3.5   No results for input(s): LIPASE, AMYLASE in the last 168 hours. No results for input(s): AMMONIA in the last 168 hours.  CBC:  Recent Labs Lab 09/28/15 1700 09/28/15 1737  WBC 5.8  --   NEUTROABS 2.0  --   HGB 13.0 13.9  HCT 39.7 41.0  MCV 95.4  --   PLT 67*  --     Cardiac Enzymes:  Recent Labs Lab 09/28/15 1701  CKTOTAL 57    BNP: Invalid input(s): POCBNP  CBG:  Recent Labs Lab  09/28/15 1711  GLUCAP 100*    Microbiology: No results found for this or any previous visit.  Coagulation Studies:  Recent Labs  09/28/15 1700  LABPROT 13.7  INR 1.03    Urinalysis:  Recent Labs Lab 09/28/15 1755  COLORURINE YELLOW  LABSPEC 1.015  PHURINE 5.0  GLUCOSEU NEGATIVE  HGBUR MODERATE*  BILIRUBINUR NEGATIVE  KETONESUR NEGATIVE  PROTEINUR NEGATIVE  NITRITE NEGATIVE  LEUKOCYTESUR NEGATIVE    Lipid Panel:  No results found for: CHOL, TRIG, HDL, CHOLHDL, VLDL, LDLCALC  HgbA1C: No results found for: HGBA1C  Urine Drug Screen:     Component Value Date/Time   LABOPIA NONE DETECTED 09/28/2015 1755   COCAINSCRNUR NONE DETECTED 09/28/2015 1755   LABBENZ NONE DETECTED 09/28/2015 1755   AMPHETMU NONE DETECTED 09/28/2015 1755   THCU NONE DETECTED 09/28/2015 1755   LABBARB NONE DETECTED 09/28/2015 1755    Alcohol Level:  Recent Labs Lab 09/28/15 1700  ETH <5    Other results: EKG: - Sinus rhythm rate 91 bpm. No acute ischemic changes. Please see the formal cardiology interpretation for full details.  Imaging:  Ct Head Wo Contrast 09/28/2015  1. No acute intracranial abnormality. No intracranial mass, hemorrhage or edema.  2. Soft tissue edema/hematoma overlying the right frontal bone. No underlying fracture.    [name] 09/29/2015   No acute intracranial process. Mild chronic small vessel ischemic disease and old small RIGHT cerebellar infarcts. Scalp soft tissue swelling.    Dg Chest Port 1 View 09/28/2015   Mild cardiac enlargement otherwise no acute findings     Assessment/Plan:  82 year old male with baseline dementia but previously active. He was recently started on statin medications several months ago and since that time he has had increased lethargy and increasing weakness. Today he fell in the bathroom striking his head with subsequent seizure-like activity. Head CT and MRI unremarkable. He is now on Keppra. Statin medications  and medications for dementia have been discontinued. Platelet count 67,000 possibly related to medications.. Per Dr. Lavon Paganini taper and discontinue Zoloft. Await EEG     Hassel Neth [facility] Pager [phone] 09/29/2015, 4:28 PM

## 2015-09-29 NOTE — Evaluation (Signed)
Clinical/Bedside Swallow Evaluation Patient Details  Name: Trevor Brock MRN: [id number] Date of Birth: Sep 12, 1932  Today's Date: 09/29/2015 Time: SLP Start Time (ACUTE ONLY): 1250 SLP Stop Time (ACUTE ONLY): 1315 SLP Time Calculation (min) (ACUTE ONLY): 25 min  Past Medical History:  Past Medical History  Diagnosis Date  . Hyperlipidemia   . Depression   . Mild memory disturbance   . Assistance needed for mobility     walks with walker  . Hypothyroid    Past Surgical History:  Past Surgical History  Procedure Laterality Date  . Cholecystectomy    . Tonsillectomy    . Retinal detachment surgery Right    HPI:  Trevor Brock is a 82 y.o. male with baseline dementia, hypothyroidism, hyperlipidemia. The history is provided by the patient's wife due to dementia and the patient not being able to recall events. The patient had a syncopal episode approximately 4 PM. The patient had chemotherapy home from dinner, went to the bathroom. His wife heard him fall. When she went into the bathroom he was vomiting and having "jerking motions". The patient was lying on his side with no witnessed aspirations. EMS was called and the patient was transported to the hospital. Length of episode is uncertain. When EMS arrived, the patient had right-sided gaze and left-sided weakness and a code stroke was called. Patient was brought to the hospital for evaluation and had a negative CT scan. Neuro telemetry consulted to the patient and noticed that gaze preference had resolved. No obvious facial asymmetry. Per the patient's family, the patient is not at baseline currently cognitively. Current MRI and chest x-ray are showing no acute findings.     Assessment / Plan / Recommendation Clinical Impression  Clinical swallowing evaluation was completed.  Limited oral mechanism exam completed due to difficulty of the patient to follow commands.  The patient was sleepy when ST entered the room but roused easily.  He  did require cues to remain awake throughout the assessment.  He presented with mild oropharyngeal dysphagia characterized by delayed oral transit for dry solids and mildly delayed swallow trigger.  Overt s/s of aspiration were not observed.  Recommend begin a dysphagia 3 diet with thin liquids.  ST to follow for therapeutic diet tolerance and diet advancement as alertness level improves.     Aspiration Risk  Mild aspiration risk    Diet Recommendation   Dysphagia 3 with thin liquids.  Medication Administration: Crushed with puree    Other  Recommendations Oral Care Recommendations: Oral care BID   Follow up Recommendations   (TBD)    Frequency and Duration min 2x/week  2 weeks       Prognosis Prognosis for Safe Diet Advancement: Good      Swallow Study   General Date of Onset: 09/28/15 HPI: Trevor Brock is a 82 y.o. male with baseline dementia, hypothyroidism, hyperlipidemia. The history is provided by the patient's wife due to dementia and the patient not being able to recall events. The patient had a syncopal episode approximately 4 PM. The patient had chemotherapy home from dinner, went to the bathroom. His wife heard him fall. When she went into the bathroom he was vomiting and having "jerking motions". The patient was lying on his side with no witnessed aspirations. EMS was called and the patient was transported to the hospital. Length of episode is uncertain. When EMS arrived, the patient had right-sided gaze and left-sided weakness and a code stroke was called. Patient was brought  to the hospital for evaluation and had a negative CT scan. Neuro telemetry consulted to the patient and noticed that gaze preference had resolved. No obvious facial asymmetry. Per the patient's family, the patient is not at baseline currently cognitively. Current MRI and chest x-ray are showing no acute findings.   Previous Swallow Assessment: None noted Diet Prior to this Study: NPO Temperature  Spikes Noted: No Respiratory Status: Room air History of Recent Intubation: No Behavior/Cognition: Alert;Cooperative;Requires cueing Oral Cavity Assessment: Dry Oral Care Completed by SLP: Yes Oral Cavity - Dentition: Dentures, not available;Missing dentition Vision: Functional for self-feeding Self-Feeding Abilities: Needs assist Patient Positioning: Upright in bed Baseline Vocal Quality: Normal Volitional Cough: Weak Volitional Swallow: Able to elicit    Oral/Motor/Sensory Function Overall Oral Motor/Sensory Function: Other (comment) (Limited assessment due to difficulty following all commands.)   Ice Chips Ice chips: Not tested   Thin Liquid Thin Liquid: Impaired Presentation: Cup;Spoon;Straw Pharyngeal  Phase Impairments: Suspected delayed Swallow;Multiple swallows    Nectar Thick Nectar Thick Liquid: Not tested   Honey Thick Honey Thick Liquid: Not tested   Puree Puree: Impaired Presentation: Spoon Oral Phase Impairments: Impaired mastication Oral Phase Functional Implications: Prolonged oral transit Pharyngeal Phase Impairments: Suspected delayed Swallow   Solid   GO   Solid: Impaired Presentation: Spoon Oral Phase Impairments: Impaired mastication Oral Phase Functional Implications: Prolonged oral transit Pharyngeal Phase Impairments: Suspected delayed Swallow    Functional Assessment Tool Used: ASHA NOMS and clinical judgment.   Functional Limitations: Swallowing Swallow Current Status [phone]): At least 20 percent but less than 40 percent impaired, limited or restricted Swallow Goal Status [phone]): At least 1 percent but less than 20 percent impaired, limited or restricted   Dimas Aguas, MA, CCC-SLP Acute Rehab SLP [phone] Dimas Aguas N 09/29/2015,1:29 PM

## 2015-09-29 NOTE — Progress Notes (Signed)
Patient ID: Trevor Brock Mac, male   DOB: 1933/01/01, 82 y.o.   MRN: [id number] [facility] PROGRESS NOTE  Trevor Brock Witherell NWG:[id number] DOB: 1933/01/01 DOA: 09/28/2015  PCP: Dr. Shelva MajesticZachary Hall   Brief narrative:    82 y.o. male with baseline dementia, hypothyroidism, hyperlipidemia, recently taken off statins due to lower extremity weakness (per son), [facility] for evaluation of ? Syncopal event. Pt apparently did well prior to the event and after dinner went to bathroom and wife heard him fall. When she came to bathroom pt was vomiting and was having "jerking like movements in lower extremities". Upon EMS arrival, the patient had right-sided gaze and left-sided weakness and a code stroke was called. Patient was brought to the hospital for evaluation and had a negative CT scan. TRH asked to for further evaluation   Assessment/Plan:    Active Problems:   Syncope - unclear etiology at this time, ? Dehydration, ? Orthostatics  - UA and CXR with no clear acute abnormality to explain the event - not clear if truly seizure episode, neurologist consulted for assistance, appreciate assistance  - ? Need for EEG - pt on keppra but family concerned with addition of new medication     Hyperlipidemia - statins have been recently d/c due to weakness    Hypothyroid - continue synthroid     Hypokalemia - mild, supplement and repeat BMP in AM     Dementia - unclear severity, to be determined once pt more clinically stable    Thrombocytopenia - unclear etiology - will Dr. Margo AyeHall to fax records to figure out what the baseline is and if this is ? Chronic vs acute - stop Lovenox   DVT prophylaxis - stop Lovenox SQ, start SCD  Code Status: Full.  Family Communication:  plan of care discussed with wife and son over the phone  Disposition Plan: Home by 4/4  IV access:  Peripheral IV  Procedures and diagnostic studies:    Ct Head Wo Contrast 09/28/2015 No acute intracranial  abnormality. No intracranial mass, hemorrhage or edema. 2. Soft tissue edema/hematoma overlying the right frontal bone. No underlying fracture. These results were called by telephone at the time of interpretation on 09/28/2015 at 5:16 pm to Dr. Samuel JesterKATHLEEN MCMANUS , who verbally acknowledged these results.   Mr Laqueta JeanBrain W Wo Contrast 09/29/2015  No acute intracranial process. Mild chronic small vessel ischemic disease and old small RIGHT cerebellar infarcts. Scalp soft tissue swelling.   Dg Chest Port 1 View 09/28/2015 Mild cardiac enlargement otherwise no acute findings  Medical Consultants:  Neurology   Other Consultants:  PT  IAnti-Infectives:   None  Debbora PrestoMAGICK-Salih Williamson, MD  [facility] Pager [phone]  If 7PM-7AM, please contact night-coverage [email or web address] Password [email or web address] 09/29/2015, 2:51 PM  HPI/Subjective: No events overnight.   Objective: Filed Vitals:   09/28/15 2202 09/28/15 2315 09/29/15 0634 09/29/15 1335  BP: 135/54 141/61 143/50 133/53  Pulse: 70 76 93 63  Temp:  98.8 F (37.1 C) 98.2 F (36.8 C) 98.5 F (36.9 C)  TempSrc:    Oral  Resp: 18 18 18 18   Height:      Weight:      SpO2: 99% 100% 97% 100%    Intake/Output Summary (Last 24 hours) at 09/29/15 1451 Last data filed at 09/29/15 1300  Gross per 24 hour  Intake  527.5 ml  Output    800 ml  Net -272.5 ml    Exam:   General:  Pt is somnolent,  confused this AM, in restraints   Cardiovascular: Regular rate and rhythm, no rubs, no gallops  Respiratory: Clear to auscultation bilaterally, no wheezing, diminished breath sounds at bases   Abdomen: Soft, non tender, non distended, bowel sounds present, no guarding  Extremities: pulses DP and PT palpable bilaterally   Data Reviewed: Basic Metabolic Panel:  Recent Labs Lab 09/28/15 1700 09/28/15 1737  NA 138 139  K 3.5 3.4*  CL 105 101  CO2 20*  --   GLUCOSE 107* 105*  BUN 9 8  CREATININE 0.85 0.80  CALCIUM 8.4*  --    Liver Function Tests:  Recent  Labs Lab 09/28/15 1700  AST 51*  ALT 25  ALKPHOS 178*  BILITOT 0.8  PROT 6.4*  ALBUMIN 3.5   CBC:  Recent Labs Lab 09/28/15 1700 09/28/15 1737  WBC 5.8  --   NEUTROABS 2.0  --   HGB 13.0 13.9  HCT 39.7 41.0  MCV 95.4  --   PLT 67*  --    Cardiac Enzymes:  Recent Labs Lab 09/28/15 1701  CKTOTAL 57   CBG:  Recent Labs Lab 09/28/15 1711  GLUCAP 100*   Scheduled Meds: . aspirin  325 mg Oral Daily  . ferrous sulfate  325 mg Oral q morning - 10a  . levETIRAcetam  500 mg Intravenous Q12H  . levothyroxine  112 mcg Oral QAC breakfast  . sertraline  100 mg Oral Daily   Continuous Infusions: . sodium chloride 75 mL/hr (09/29/15 9604)

## 2015-09-30 ENCOUNTER — Observation Stay (HOSPITAL_COMMUNITY): Payer: Medicare Other

## 2015-09-30 DIAGNOSIS — F0391 Unspecified dementia with behavioral disturbance: Secondary | ICD-10-CM

## 2015-09-30 DIAGNOSIS — E038 Other specified hypothyroidism: Secondary | ICD-10-CM

## 2015-09-30 DIAGNOSIS — R4182 Altered mental status, unspecified: Secondary | ICD-10-CM

## 2015-09-30 LAB — BASIC METABOLIC PANEL
ANION GAP: 10 (ref 5–15)
BUN: 5 mg/dL — ABNORMAL LOW (ref 6–20)
CHLORIDE: 105 mmol/L (ref 101–111)
CO2: 23 mmol/L (ref 22–32)
Calcium: 8.3 mg/dL — ABNORMAL LOW (ref 8.9–10.3)
Creatinine, Ser: 0.7 mg/dL (ref 0.61–1.24)
GFR calc non Af Amer: >60 mL/min (ref >60)
Glucose, Bld: 91 mg/dL (ref 65–99)
POTASSIUM: 3.8 mmol/L (ref 3.5–5.1)
SODIUM: 138 mmol/L (ref 135–145)

## 2015-09-30 LAB — CBC
HCT: 36.2 % — ABNORMAL LOW (ref 39.0–52.0)
HEMOGLOBIN: 12 g/dL — AB (ref 13.0–17.0)
MCH: 31.5 pg (ref 26.0–34.0)
MCHC: 33.1 g/dL (ref 30.0–36.0)
MCV: 95 fL (ref 78.0–100.0)
Platelets: 50 10*3/uL — ABNORMAL LOW (ref 150–400)
RBC: 3.81 MIL/uL — AB (ref 4.22–5.81)
RDW: 15.8 % — ABNORMAL HIGH (ref 11.5–15.5)
WBC: 3.2 10*3/uL — ABNORMAL LOW (ref 4.0–10.5)

## 2015-09-30 MED ORDER — LORAZEPAM 2 MG/ML IJ SOLN
0.5000 mg | Freq: Once | INTRAMUSCULAR | Status: AC
Start: 2015-09-30 — End: 2015-09-30
  Administered 2015-09-30: 0.5 mg via INTRAVENOUS
  Filled 2015-09-30 (×2): qty 1

## 2015-09-30 NOTE — Procedures (Signed)
History: Trevor Brock is an 82 y.o. male patient with altered mental status. Routine inpatient EEG was performed for further evaluation.   Patient Active Problem List   Diagnosis Date Noted  . Syncope 09/28/2015  . Dementia 09/28/2015  . Assistance needed for mobility   . Hyperlipidemia   . Mild memory disturbance   . Hypothyroid   . OTHER SPECIFIED ANEMIAS 07/30/2009  . HEMOCCULT POSITIVE STOOL 04/29/2009     Current facility-administered medications:  .  0.9 %  sodium chloride infusion, 75 mL/hr, Intravenous, Continuous, Rhona RaiderJacob J Stinson, DO, Last Rate: 75 mL/hr at 09/30/15 1114, 75 mL/hr at 09/30/15 1114 .  aspirin tablet 325 mg, 325 mg, Oral, Daily, Rhona RaiderJacob J Stinson, DO, 325 mg at 09/30/15 0809 .  ferrous sulfate tablet 325 mg, 325 mg, Oral, q morning - 10a, Rhona RaiderJacob J Stinson, DO, 325 mg at 09/30/15 0809 .  levETIRAcetam (KEPPRA) 500 mg in sodium chloride 0.9 % 100 mL IVPB, 500 mg, Intravenous, Q12H, Rhona RaiderJacob J Stinson, DO, 500 mg at 09/30/15 2056 .  levothyroxine (SYNTHROID, LEVOTHROID) tablet 112 mcg, 112 mcg, Oral, QAC breakfast, Levie HeritageJacob J Stinson, DO, 112 mcg at 09/30/15 0809 .  ondansetron (ZOFRAN) tablet 4 mg, 4 mg, Oral, Q6H PRN **OR** ondansetron (ZOFRAN) injection 4 mg, 4 mg, Intravenous, Q6H PRN, Rhona RaiderJacob J Stinson, DO .  sertraline (ZOLOFT) tablet 50 mg, 50 mg, Oral, Daily, David L Rinehuls, PA-C, 50 mg at 09/30/15 [id number]   Introduction:  This is a 19 channel routine scalp EEG performed at the bedside with bipolar and monopolar montages arranged in accordance to the international 10/20 system of electrode placement. One channel was dedicated to EKG recording.   Findings:  The best background Activity is in the range of 6-7 Hz . No definite evidence of abnormal epileptiform discharges or electrographic seizures were noted during this recording.   Impression:  Abnormal routine inpatient EEG suggestive of mild to moderate encephalopathy as described. Clinical correlation is  recommended .

## 2015-09-30 NOTE — Progress Notes (Signed)
Nutrition Brief Note  Patient identified on the Malnutrition Screening Tool (MST) Report  Wt Readings from Last 15 Encounters:  09/28/15 219 lb (99.338 kg)  07/30/09 219 lb (99.338 kg)  04/29/09 221 lb (100.245 kg)   82 y.o. male with baseline dementia, hypothyroidism, hyperlipidemia, recently taken off statins due to lower extremity weakness (per son), [facility] of ? Syncopal event. Pt apparently did well prior to the event and after dinner went to bathroom and wife heard him fall. When she came to bathroom pt was vomiting and was having "jerking like movements in lower extremities". Upon EMS arrival, the patient had right-sided gaze and left-sided weakness and a code stroke was called. Patient was brought to the hospital for evaluation and had a negative CT scan. TRH asked to for further evaluation    Body mass index is 29.7 kg/(m^2). Patient meets criteria for overweight based on current BMI.   Current diet order is Dysphagia 3, patient is consuming approximately 100% of meals at this time. Labs and medications reviewed.   No nutrition interventions warranted at this time. If nutrition issues arise, please consult RD.   Chyler Creely A. Mayford KnifeWilliams, RD, LDN, CDE Pager: [phone] After hours Pager: [phone]

## 2015-09-30 NOTE — Progress Notes (Signed)
EEG completed; results pending.    

## 2015-09-30 NOTE — Progress Notes (Signed)
Pt had a 2 sec. pause on telemetry and a couple of bradycardic episodes. Strips have been saved. MD notified.

## 2015-09-30 NOTE — Evaluation (Signed)
Occupational Therapy Evaluation Patient Details Name: Trevor Brock MRN: [id number] DOB: [birth year] Today's Date: 09/30/2015    History of Present Illness 82 y.o. male with h/o dementia admitted with fall, vomiting and "jerking motions". Family reports 2 months of weakness/difficulty walking and that pt's cognitive status is worse than baseline.   Clinical Impression   Patient presenting with decreased ADL and functional mobility independence secondary to above. Patient mod I to min assist for some self-care tasks PTA. Patient currently functioning at an overall mod to max assist level. Patient will benefit from acute OT to increase overall independence in the areas of ADLs, functional mobility, and overall safety in order to safely discharge to venue listed below.   During interview, wife and son present to answer most questions as pt unable at this time. Wife and son both state they have a "bad back". Son reported that patient's wife "passed out" yesterday in the hallway. Wife reports within the last 2 months, pt with at least 5 falls.     Follow Up Recommendations  SNF;Supervision/Assistance - 24 hour    Equipment Recommendations  Other (comment) (TBD next venue of care)    Recommendations for Other Services  None at this time   Precautions / Restrictions Precautions Precautions: Fall Precaution Comments: 5 falls in past 2 months Restrictions Weight Bearing Restrictions: No    Mobility Bed Mobility Overal bed mobility: Needs Assistance Bed Mobility: Supine to Sit     Supine to sit: Mod assist;+2 for safety/equipment     General bed mobility comments: Assist to raise trunk and to pivot hips to EOB. Pt pulling up on therapist and sons hands.   Transfers Overall transfer level: Needs assistance Equipment used: Rolling walker (2 wheeled) Transfers: Sit to/from Stand Sit to Stand: +2 physical assistance;Mod assist         General transfer comment: Assist to rise.  Multimodal cueing for safety and sequencing, however pt with difficulty following these commands.     Balance Overall balance assessment: Needs assistance;History of Falls Sitting-balance support: No upper extremity supported;Feet supported Sitting balance-Leahy Scale: Fair     Standing balance support: Bilateral upper extremity supported;During functional activity Standing balance-Leahy Scale: Poor Standing balance comment: anterior tilt, pt seemed fearful of falling backwards    ADL Overall ADL's : Needs assistance/impaired Eating/Feeding: Minimal assistance;Bed level   Grooming: Moderate assistance;Sitting   Upper Body Bathing: Moderate assistance;Sitting   Lower Body Bathing: Maximal assistance;Sit to/from stand   Upper Body Dressing : Sitting;Moderate assistance   Lower Body Dressing: Maximal assistance;Sit to/from stand   Toilet Transfer: Maximal assistance;Stand-pivot;RW Toilet Transfer Details (indicate cue type and reason): simulated EOB to recliner   Toileting - Clothing Manipulation Details (indicate cue type and reason): did not occur   Tub/Shower Transfer Details (indicate cue type and reason): did not occur, safet concern    General ADL Comments: Decreased independence secondary to decreased cognition and generalized weakness. Pt not able to follow simple one step commands consistently.     Vision Additional Comments: Unable to asses secondary to decreased cognition          Pertinent Vitals/Pain Pain Assessment: Faces Pain Score: 0-No pain Faces Pain Scale: No hurt     Hand Dominance Right   Extremity/Trunk Assessment Upper Extremity Assessment Upper Extremity Assessment: Difficult to assess due to impaired cognition   Lower Extremity Assessment Lower Extremity Assessment: Difficult to assess due to impaired cognition   Cervical / Trunk Assessment Cervical / Trunk Assessment: Kyphotic  Communication Communication Communication: No  difficulties   Cognition Arousal/Alertness: Awake/alert Behavior During Therapy: WFL for tasks assessed/performed Overall Cognitive Status: Impaired/Different from baseline Area of Impairment: Following commands;Safety/judgement;Awareness;Problem solving;Memory       Following Commands: Follows one step commands inconsistently Safety/Judgement: Decreased awareness of safety;Decreased awareness of deficits   Problem Solving: Slow processing;Decreased initiation;Difficulty sequencing;Requires verbal cues;Requires tactile cues General Comments: Difficult to fully asses secondary to pt very HOH. Patient's son reports, "he's different since falling and hitting his head".               Home Living Family/patient expects to be discharged to:: Private residence Living Arrangements: Spouse/significant other Available Help at Discharge: Family;Available 24 hours/day Type of Home: House Home Access: Stairs to enter [employer] of Steps: 4 Entrance Stairs-Rails: Left Home Layout: One level     Bathroom Shower/Tub: Tub/shower unit;Door   [employer] Toilet: Standard     Home Equipment: Environmental consultant - 2 wheels;Shower seat;Hand held shower head   Prior Functioning/Environment Level of Independence: Independent with assistive device(s);Needs assistance  Gait / Transfers Assistance Needed: mod I using RW, has used RW for 2 months, prior to that he was independent. 5 falls in 2 months.  ADL's / Homemaking Assistance Needed: wife reports she assists secondary to patient doesn't always want to perform hygiene Communication / Swallowing Assistance Needed: independent Comments: h/o falls, wife reports 5 falls within the last 2 months    OT Diagnosis: Generalized weakness;Altered mental status   OT Problem List: Decreased strength;Decreased range of motion;Decreased activity tolerance;Impaired balance (sitting and/or standing);Decreased coordination;Decreased cognition;Decreased safety  awareness;Decreased knowledge of use of DME or AE;Decreased knowledge of precautions;Impaired sensation   OT Treatment/Interventions: Self-care/ADL training;Therapeutic exercise;Energy conservation;DME and/or AE instruction;Therapeutic activities;Patient/family education;Balance training;Cognitive remediation/compensation    OT Goals(Current goals can be found in the care plan section) Acute Rehab OT Goals Patient Stated Goal: improve strength/walking per family OT Goal Formulation: With family (pt unable) Time For Goal Achievement: 10/14/15 Potential to Achieve Goals: Good ADL Goals Pt Will Perform Grooming: with set-up;sitting (unsupported) Pt Will Perform Lower Body Bathing: with min assist;sit to/from stand Pt Will Perform Lower Body Dressing: with min assist;sit to/from stand Pt Will Transfer to Toilet: with min assist;stand pivot transfer;bedside commode Additional ADL Goal #1: Pt will be supervision for bed mobility as a precursor for ADLs and to decrease burden of care Additional ADL Goal #2: Pt will be supervision for basic sit to/from stands as a precursor for ADLs and to decrease burden of care  OT Frequency: Min 2X/week   Barriers to D/C: Decreased caregiver support  Wife states she has a bad back, son reports he does as well. Son reported that wife "passed out in the hallway yesterday".        Co-evaluation PT/OT/SLP Co-Evaluation/Treatment: Yes Reason for Co-Treatment: Complexity of the patient's impairments (multi-system involvement);For patient/therapist safety;Necessary to address cognition/behavior during functional activity PT goals addressed during session: Mobility/safety with mobility;Balance;Proper use of DME;Strengthening/ROM OT goals addressed during session: ADL's and self-care;Strengthening/ROM      End of Session Equipment Utilized During Treatment: Gait belt;Rolling walker  Activity Tolerance: Patient tolerated treatment well Patient left: in chair;with  call bell/phone within reach;with family/visitor present;with chair alarm set   Time: 1610-9604 OT Time Calculation (min): 27 min Charges:  OT General Charges $OT Visit: 1 Procedure OT Evaluation $OT Eval Moderate Complexity: 1 Procedure G-Codes: OT G-codes **NOT FOR INPATIENT CLASS** Functional Assessment Tool Used: clinical judgement  Functional Limitation: Self care Self Care Current Status (  [id number]): At least 60 percent but less than 80 percent impaired, limited or restricted Self Care Goal Status ([id number](G8988): At least 20 percent but less than 40 percent impaired, limited or restricted  Edwin CapPatricia Damonie Furney , MS, OTR/L, CLT Pager: [phone]  09/30/2015, 11:35 AM

## 2015-09-30 NOTE — Progress Notes (Signed)
Patient ID: Trevor Brock, male   DOB: 1932/07/23, 82 y.o.   MRN: [id number] [facility] PROGRESS NOTE  Trevor Brock WUJ:[id number] DOB: 1933/02/11 DOA: 09/28/2015  PCP: Dr. Shelva Majestic   Brief narrative:    82 y.o. male with baseline dementia, hypothyroidism, hyperlipidemia, recently taken off statins due to lower extremity weakness (per son), presented to [facility] ED for evaluation of ? Syncopal event. Pt apparently did well prior to the event and after dinner went to bathroom and wife heard him fall. When she came to bathroom pt was vomiting and was having "jerking like movements in lower extremities". Upon EMS arrival, the patient had right-sided gaze and left-sided weakness and a code stroke was called. Patient was brought to the hospital for evaluation and had a negative CT scan. [facility] asked to for further evaluation   Assessment/Plan:    Active Problems:   Syncope - unclear etiology at this time, ? Dehydration, ? Orthostatics  - UA and CXR with no clear acute abnormality to explain the event - not clear if truly seizure episode, neurologist consulted for assistance, appreciate assistance  - EEG to be done today - pt on keppra but family concerned with addition of new medication     Hyperlipidemia - statins have been recently d/c due to weakness    Hypothyroid - continue synthroid     Hypokalemia - mild, supplement and repeat BMP in AM     Dementia - unclear severity, to be determined once pt more clinically stable    Pancytopenia  - blood counts low in all three cell lines  - awaiting records from Dr. Margo Aye  - CBC in AM  DVT prophylaxis - SCD's  Code Status: Full.  Family Communication:  plan of care discussed with wife and son over the phone  Disposition Plan: Home by 4/4  IV access:  Peripheral IV  Procedures and diagnostic studies:    Ct Head Wo Contrast 09/28/2015 No acute intracranial abnormality. No intracranial mass, hemorrhage or edema. 2. Soft tissue  edema/hematoma overlying the right frontal bone. No underlying fracture. These results were called by telephone at the time of interpretation on 09/28/2015 at 5:16 pm to Dr. Samuel Jester , who verbally acknowledged these results.   Trevor Brock Wo Contrast 09/29/2015  No acute intracranial process. Mild chronic small vessel ischemic disease and old small RIGHT cerebellar infarcts. Scalp soft tissue swelling.   Dg Chest Port 1 View 09/28/2015 Mild cardiac enlargement otherwise no acute findings  Medical Consultants:  Neurology   Other Consultants:  PT  IAnti-Infectives:   None  Debbora Presto, MD  [facility] Pager [phone]  If 7PM-7AM, please contact night-coverage [email or web address] Password [email or web address] 09/30/2015, 5:16 PM  HPI/Subjective: No events overnight.   Objective: Filed Vitals:   09/29/15 2227 09/30/15 0537 09/30/15 0643 09/30/15 1427  BP: 127/53 133/54 140/56 140/52  Pulse: 58 62 53 56  Temp: 98.5 F (36.9 C) 98.1 F (36.7 C) 98.1 F (36.7 C) 98.4 F (36.9 C)  TempSrc: Oral Oral Oral Oral  Resp: Height:      Weight:      SpO2: 99% 96% 94% 99%    Intake/Output Summary (Last 24 hours) at 09/30/15 1716 Last data filed at 09/30/15 1513  Gross per 24 hour  Intake 1328.75 ml  Output   1375 ml  Net -46.25 ml    Exam:   General:  Pt is awake but confused, very slow to respond   Cardiovascular:  Regular rate and rhythm, no rubs, no gallops  Respiratory: Clear to auscultation bilaterally, no wheezing, diminished breath sounds at bases   Abdomen: Soft, non tender, non distended, bowel sounds present, no guarding  Extremities: pulses DP and PT palpable bilaterally   Data Reviewed: Basic Metabolic Panel:  Recent Labs Lab 09/28/15 1700 09/28/15 1737 09/30/15 0640  NA 138 139 138  K 3.5 3.4* 3.8  CL 105 101 105  CO2 20*  --  23  GLUCOSE 107* 105* 91  BUN 9 8 5*  CREATININE 0.85 0.80 0.70  CALCIUM 8.4*  --  8.3*   Liver Function Tests:  Recent  Labs Lab 09/28/15 1700  AST 51*  ALT 25  ALKPHOS 178*  BILITOT 0.8  PROT 6.4*  ALBUMIN 3.5   CBC:  Recent Labs Lab 09/28/15 1700 09/28/15 1737 09/30/15 0640  WBC 5.8  --  3.2*  NEUTROABS 2.0  --   --   HGB 13.0 13.9 12.0*  HCT 39.7 41.0 36.2*  MCV 95.4  --  95.0  PLT 67*  --  50*   Cardiac Enzymes:  Recent Labs Lab 09/28/15 1701  CKTOTAL 57   CBG:  Recent Labs Lab 09/28/15 1711  GLUCAP 100*   Scheduled Meds: . aspirin  325 mg Oral Daily  . ferrous sulfate  325 mg Oral q morning - 10a  . levETIRAcetam  500 mg Intravenous Q12H  . levothyroxine  112 mcg Oral QAC breakfast  . sertraline  50 mg Oral Daily   Continuous Infusions: . sodium chloride 75 mL/hr (09/30/15 1114)

## 2015-09-30 NOTE — Progress Notes (Signed)
Pt was getting agitated when his wife left. MD was paged for an anti anxiety. Pt later slept. Ativan was later gotten for the patient when he was showing some signs of agitation at mid night. Upon arrival to the room for the administration of the medication, pt was asleep. Early in the morning around 3:30am, pt was agitated and attempted getting out of bed. The prescribed 0.5mg  Ativan was given. Family is at bedside. Will continue to monitor.

## 2015-09-30 NOTE — Evaluation (Signed)
Physical Therapy Evaluation Patient Details Name: Trevor Brock MRN: [id number] DOB: [birth year] Today's Date: 09/30/2015   History of Present Illness  82 y.o. male with h/o dementia admitted with fall, vomiting and "jerking motions". Family reports 2 months of weakness/difficulty walking and that pt's cognitive status is worse than baseline.  Clinical Impression  Pt admitted with above diagnosis. Pt currently with functional limitations due to the deficits listed below (see PT Problem List). +2 assist for sit to stand and to ambulate 10' with RW. Pt is quite unsteady with ambulation and is a high fall risk. ST-SNF recommended, family agreeable.  Pt will benefit from skilled PT to increase their independence and safety with mobility to allow discharge to the venue listed below.       Follow Up Recommendations SNF;Supervision/Assistance - 24 hour    Equipment Recommendations  Wheelchair (measurements PT)    Recommendations for Other Services OT consult     Precautions / Restrictions Precautions Precautions: Fall Precaution Comments: 5 falls in past 2 months Restrictions Weight Bearing Restrictions: No      Mobility  Bed Mobility Overal bed mobility: Needs Assistance Bed Mobility: Supine to Sit     Supine to sit: Mod assist     General bed mobility comments: assist to raise trunk and to pivot hips to EOB  Transfers Overall transfer level: Needs assistance Equipment used: Rolling walker (2 wheeled) Transfers: Sit to/from Stand Sit to Stand: +2 physical assistance;Mod assist         General transfer comment: assist to rise  Ambulation/Gait Ambulation/Gait assistance: Mod assist;+2 physical assistance;+2 safety/equipment Ambulation Distance (Feet): 10 Feet Assistive device: Rolling walker (2 wheeled) Gait Pattern/deviations: Wide base of support;Decreased step length - right;Decreased step length - left;Trunk flexed   Gait velocity interpretation: Below normal  speed for age/gender General Gait Details: unsteady gait, mod A for balance, tends to lean R, verbal/manual cues for positioning in RW, he pushes walker too far forward; distance limited by PT due to unsteadiness/fall risk  Stairs            Wheelchair Mobility    Modified Rankin (Stroke Patients Only)       Balance Overall balance assessment: Needs assistance;History of Falls   Sitting balance-Leahy Scale: Fair     Standing balance support: Bilateral upper extremity supported Standing balance-Leahy Scale: Poor                               Pertinent Vitals/Pain Pain Assessment: Faces Faces Pain Scale: No hurt    Home Living Family/patient expects to be discharged to:: Private residence Living Arrangements: Spouse/significant other Available Help at Discharge: Family;Available 24 hours/day Type of Home: House Home Access: Stairs to enter Entrance Stairs-Rails: Left Entrance Stairs-Number of Steps: 4 Home Layout: One level Home Equipment: Walker - 2 wheels;Shower seat;Hand held shower head      Prior Function Level of Independence: Independent with assistive device(s);Needs assistance   Gait / Transfers Assistance Needed: mod I using RW, has used RW for 2 months, prior to that he was independent. 5 falls in 2 months.   ADL's / Homemaking Assistance Needed: wife reports she assists secondary to patient doesn't always want to perform hygiene  Comments: h/o falls, wife reports 5 falls within the last 2 months     Hand Dominance   Dominant Hand: Right    Extremity/Trunk Assessment  Lower Extremity Assessment: Difficult to assess due to impaired cognition      Cervical / Trunk Assessment: Kyphotic  Communication   Communication: No difficulties  Cognition Arousal/Alertness: Awake/alert Behavior During Therapy: WFL for tasks assessed/performed Overall Cognitive Status: Impaired/Different from baseline Area of Impairment:  Following commands;Safety/judgement;Awareness;Problem solving;Memory       Following Commands: Follows one step commands inconsistently Safety/Judgement: Decreased awareness of safety;Decreased awareness of deficits   Problem Solving: Slow processing;Decreased initiation;Difficulty sequencing;Requires verbal cues;Requires tactile cues      General Comments      Exercises        Assessment/Plan    PT Assessment Patient needs continued PT services  PT Diagnosis Difficulty walking;Generalized weakness;Altered mental status   PT Problem List Decreased strength;Decreased activity tolerance;Decreased balance;Decreased cognition;Decreased mobility;Decreased safety awareness  PT Treatment Interventions DME instruction;Gait training;Functional mobility training;Therapeutic activities;Patient/family education;Balance training;Therapeutic exercise   PT Goals (Current goals can be found in the Care Plan section) Acute Rehab PT Goals Patient Stated Goal: improve strength/walking PT Goal Formulation: With family Time For Goal Achievement: 10/14/15 Potential to Achieve Goals: Fair    Frequency Min 3X/week   Barriers to discharge Other (comment) pt's wife not able to manage pt at home in present condition    Co-evaluation PT/OT/SLP Co-Evaluation/Treatment: Yes Reason for Co-Treatment: Complexity of the patient's impairments (multi-system involvement);Necessary to address cognition/behavior during functional activity;For patient/therapist safety PT goals addressed during session: Mobility/safety with mobility;Balance;Proper use of DME;Strengthening/ROM         End of Session Equipment Utilized During Treatment: Gait belt Activity Tolerance: Patient tolerated treatment well Patient left: in chair;with call bell/phone within reach;with chair alarm set;with family/visitor present Nurse Communication: Mobility status    Functional Assessment Tool Used: clinical judgement Functional  Limitation: Mobility: Walking and moving around Mobility: Walking and Moving Around Current Status ([id number](G8978): At least 60 percent but less than 80 percent impaired, limited or restricted Mobility: Walking and Moving Around Goal Status [phone]([id number]): At least 20 percent but less than 40 percent impaired, limited or restricted    Time: 1003-1026 PT Time Calculation (min) (ACUTE ONLY): 23 min   Charges:   PT Evaluation $PT Eval Moderate Complexity: 1 Procedure PT Treatments $Gait Training: 8-22 mins   PT G Codes:   PT G-Codes **NOT FOR INPATIENT CLASS** Functional Assessment Tool Used: clinical judgement Functional Limitation: Mobility: Walking and moving around Mobility: Walking and Moving Around Current Status ([id number](G8978): At least 60 percent but less than 80 percent impaired, limited or restricted Mobility: Walking and Moving Around Goal Status [phone](G8979): At least 20 percent but less than 40 percent impaired, limited or restricted    Tamala SerUhlenberg, Seaton Hofmann Kistler 09/30/2015, 10:40 AM [phone]

## 2015-10-01 DIAGNOSIS — F0390 Unspecified dementia without behavioral disturbance: Secondary | ICD-10-CM | POA: Diagnosis not present

## 2015-10-01 DIAGNOSIS — R55 Syncope and collapse: Secondary | ICD-10-CM | POA: Diagnosis not present

## 2015-10-01 LAB — CBC
HCT: 38 % — ABNORMAL LOW (ref 39.0–52.0)
Hemoglobin: 12.5 g/dL — ABNORMAL LOW (ref 13.0–17.0)
MCH: 31.3 pg (ref 26.0–34.0)
MCHC: 32.9 g/dL (ref 30.0–36.0)
MCV: 95 fL (ref 78.0–100.0)
PLATELETS: 50 10*3/uL — AB (ref 150–400)
RBC: 4 MIL/uL — ABNORMAL LOW (ref 4.22–5.81)
RDW: 15.7 % — ABNORMAL HIGH (ref 11.5–15.5)
WBC: 3.8 10*3/uL — ABNORMAL LOW (ref 4.0–10.5)

## 2015-10-01 LAB — BASIC METABOLIC PANEL
Anion gap: 11 (ref 5–15)
BUN: 6 mg/dL (ref 6–20)
CALCIUM: 8.4 mg/dL — AB (ref 8.9–10.3)
CO2: 22 mmol/L (ref 22–32)
Chloride: 102 mmol/L (ref 101–111)
Creatinine, Ser: 0.75 mg/dL (ref 0.61–1.24)
GFR calc Af Amer: >60 mL/min (ref >60)
GLUCOSE: 89 mg/dL (ref 65–99)
Potassium: 3.6 mmol/L (ref 3.5–5.1)
SODIUM: 135 mmol/L (ref 135–145)

## 2015-10-01 NOTE — Progress Notes (Signed)
Patient ID: Trevor Brock, male   DOB: Nov 04, 1932, 82 y.o.   MRN: [id number] [facility] PROGRESS NOTE  Trevor Brock QMV:[id number] DOB: Nov 04, 1932 DOA: 09/28/2015  PCP: Dr. Shelva MajesticZachary Hall   Brief narrative:    82 y.o. male with baseline dementia, hypothyroidism, hyperlipidemia, recently taken off statins due to lower extremity weakness (per son), [facility] ED for evaluation of ? Syncopal event. Pt apparently did well prior to the event and after dinner went to bathroom and wife heard him fall. When she came to bathroom pt was vomiting and was having "jerking like movements in lower extremities". Upon EMS arrival, the patient had right-sided gaze and left-sided weakness and a code stroke was called. Patient was brought to the hospital for evaluation and had a negative CT scan. TR[facility] asked to for further evaluation   Assessment/Plan:    Active Problems:   Syncope - unclear etiology at this time, ? Dehydration, ? Orthostatics  - UA and CXR with no clear acute abnormality to explain the event - not clear if truly seizure episode, neurologist consulted for assistance, appreciate assistance  - EEG pending  - pt on keppra but family concerned with addition of new medication, further discussion with neurology     Hyperlipidemia - statins have been recently d/c due to weakness    Hypothyroid - continue synthroid     Hypokalemia - mild, supplement and repeat BMP in AM     Dementia - unclear severity, to be determined once pt more clinically stable    Pancytopenia  - blood counts low in all three cell lines, plt lower in the past 24 hours  - awaiting records from Dr. Margo AyeHall to further decide on management - oncology paged, awaiting response  - CBC in AM  DVT prophylaxis - SCD's  Code Status: Full.  Family Communication:  plan of care discussed with wife and son over the phone  Disposition Plan: likely SNF by end of the week   IV access:  Peripheral IV  Procedures and  diagnostic studies:    Ct Head Wo Contrast 09/28/2015 No acute intracranial abnormality. No intracranial mass, hemorrhage or edema. 2. Soft tissue edema/hematoma overlying the right frontal bone. No underlying fracture. These results were called by telephone at the time of interpretation on 09/28/2015 at 5:16 pm to Dr. Samuel JesterKATHLEEN MCMANUS , who verbally acknowledged these results.   Mr Laqueta JeanBrain W Wo Contrast 09/29/2015  No acute intracranial process. Mild chronic small vessel ischemic disease and old small RIGHT cerebellar infarcts. Scalp soft tissue swelling.   Dg Chest Port 1 View 09/28/2015 Mild cardiac enlargement otherwise no acute findings  Medical Consultants:  Neurology   Other Consultants:  PT  IAnti-Infectives:   None  Debbora PrestoMAGICK-Tamari Redwine, MD  [facility] Pager [phone]  If 7PM-7AM, please contact night-coverage [email or web address] Password [email or web address] 10/01/2015, 2:42 PM  HPI/Subjective: No events overnight.   Objective: Filed Vitals:   09/30/15 1427 09/30/15 2224 10/01/15 0539 10/01/15 1415  BP: 140/52 138/57 141/39 149/63  Pulse: 56 55 68 66  Temp: 98.4 F (36.9 C) 98.2 F (36.8 C) 97.7 F (36.5 C) 97.3 F (36.3 C)  TempSrc: Oral   Oral  Resp: 17 18 18 18   Height:      Weight:      SpO2: 99% 100% 100% 97%    Intake/Output Summary (Last 24 hours) at 10/01/15 1442 Last data filed at 10/01/15 1305  Gross per 24 hour  Intake 2176.25 ml  Output   2100 ml  Net  76.25 ml    Exam:   General:  Pt is awake but confused, very slow to respond   Cardiovascular: Regular rate and rhythm, no rubs, no gallops  Respiratory: Clear to auscultation bilaterally, no wheezing, diminished breath sounds at bases   Abdomen: Soft, non tender, non distended, bowel sounds present, no guarding  Extremities: pulses DP and PT palpable bilaterally   Data Reviewed: Basic Metabolic Panel:  Recent Labs Lab 09/28/15 1700 09/28/15 1737 09/30/15 0640 10/01/15 0442  NA 138 139 138 135  K 3.5 3.4* 3.8  3.6  CL 105 101 105 102  CO2 20*  --  23 22  GLUCOSE 107* 105* 91 89  BUN 9 8 5* 6  CREATININE 0.85 0.80 0.70 0.75  CALCIUM 8.4*  --  8.3* 8.4*   Liver Function Tests:  Recent Labs Lab 09/28/15 1700  AST 51*  ALT 25  ALKPHOS 178*  BILITOT 0.8  PROT 6.4*  ALBUMIN 3.5   CBC:  Recent Labs Lab 09/28/15 1700 09/28/15 1737 09/30/15 0640 10/01/15 0442  WBC 5.8  --  3.2* 3.8*  NEUTROABS 2.0  --   --   --   HGB 13.0 13.9 12.0* 12.5*  HCT 39.7 41.0 36.2* 38.0*  MCV 95.4  --  95.0 95.0  PLT 67*  --  50* 50*   Cardiac Enzymes:  Recent Labs Lab 09/28/15 1701  CKTOTAL 57   CBG:  Recent Labs Lab 09/28/15 1711  GLUCAP 100*   Scheduled Meds: . aspirin  325 mg Oral Daily  . ferrous sulfate  325 mg Oral q morning - 10a  . levETIRAcetam  500 mg Intravenous Q12H  . levothyroxine  112 mcg Oral QAC breakfast  . sertraline  50 mg Oral Daily   Continuous Infusions: . sodium chloride 75 mL/hr (10/01/15 0836)

## 2015-10-01 NOTE — NC FL2 (Signed)
[place] MEDICAID FL2 LEVEL OF CARE SCREENING TOOL     IDENTIFICATION  Patient Name: Trevor Brock Birthdate: [birth year] Sex: male Admission Date (Current Location): 09/28/2015  [facility] [place]   Facility and Address:  [facility] [place], [address], [place], [place] [address]      Provider Number: [id number]  Attending Physician Name and Address:  Dorothea OgleIskra M Myers, MD  Relative Name and Phone Number:   Talbert Forest(Shirley Filley)    Current Level of Care: Hospital Recommended Level of Care: Skilled Nursing Facility Prior Approval Number:    Date Approved/Denied:   PASRR Number:    Discharge Plan: SNF    Current Diagnoses: Patient Active Problem List   Diagnosis Date Noted  . Syncope 09/28/2015  . Dementia 09/28/2015  . Assistance needed for mobility   . Hyperlipidemia   . Mild memory disturbance   . Hypothyroid   . OTHER SPECIFIED ANEMIAS 07/30/2009  . HEMOCCULT POSITIVE STOOL 04/29/2009    Orientation RESPIRATION BLADDER Height & Weight     Self  Normal External catheter Weight: 219 lb (99.338 kg) Height:  6' (182.9 cm)  BEHAVIORAL SYMPTOMS/MOOD NEUROLOGICAL BOWEL NUTRITION STATUS     (    Patient unable to follow commands. Cranial nerves II through XII are grossly intact.) Continent Diet (regular)  AMBULATORY STATUS COMMUNICATION OF NEEDS Skin   Limited Assist Verbally Normal                       Personal Care Assistance Level of Assistance  Bathing, Dressing, Feeding Bathing Assistance: Limited assistance Feeding assistance: Limited assistance Dressing Assistance: Limited assistance     Functional Limitations Info  Sight, Hearing, Speech Sight Info: Adequate Hearing Info: Adequate Speech Info: Adequate    SPECIAL CARE FACTORS FREQUENCY  PT (By licensed PT), OT (By licensed OT)     PT Frequency: Min 3X/week OT Frequency: Min 2X/week            Contractures      Additional Factors Info  Code  Status, Allergies Code Status Info: full Allergies Info: statins           Current Medications (10/01/2015):  This is the current hospital active medication list Current Facility-Administered Medications  Medication Dose Route Frequency Provider Last Rate Last Dose  . 0.9 %  sodium chloride infusion  75 mL/hr Intravenous Continuous Levie HeritageJacob J Stinson, DO 75 mL/hr at 10/01/15 0836 75 mL/hr at 10/01/15 0836  . aspirin tablet 325 mg  325 mg Oral Daily Rhona RaiderJacob J Stinson, DO   325 mg at 10/01/15 1047  . ferrous sulfate tablet 325 mg  325 mg Oral q morning - 10a Rhona RaiderJacob J Stinson, DO   325 mg at 10/01/15 1047  . levETIRAcetam (KEPPRA) 500 mg in sodium chloride 0.9 % 100 mL IVPB  500 mg Intravenous Q12H Rhona RaiderJacob J Stinson, DO   500 mg at 10/01/15 0844  . levothyroxine (SYNTHROID, LEVOTHROID) tablet 112 mcg  112 mcg Oral QAC breakfast Levie HeritageJacob J Stinson, DO   112 mcg at 10/01/15 0820  . ondansetron (ZOFRAN) tablet 4 mg  4 mg Oral Q6H PRN Rhona RaiderJacob J Stinson, DO       Or  . ondansetron [facility](ZOFRAN) injection 4 mg  4 mg Intravenous Q6H PRN Rhona RaiderJacob J Stinson, DO      . sertraline (ZOLOFT) tablet 50 mg  50 mg Oral Daily David L Rinehuls, PA-C   50 mg at 10/01/15  1047     Discharge Medications: Please see discharge summary for a list of discharge medications.  Relevant Imaging Results:  Relevant Lab Results:   Additional Information SS# [id number]  Catheryn Bacon  BSW intern  [phone]

## 2015-10-01 NOTE — Clinical Social Work Note (Cosign Needed)
Clinical Social Work Assessment  Patient Details  Name: Trevor Brock MRN: [id number] Date of Birth: 1933/05/08  Date of referral:  10/01/15               Reason for consult:  Facility Placement                Permission sought to share information with:  Facility Medical sales representativeContact Representative, Family Supports, Case Manager Permission granted to share information::  Yes, Release of Information Signed  Name::      Trevor Brock(Shirley Sharps[place]ampon [phone]))  Agency::     Relationship::   (spouse)  Contact Information:     Housing/Transportation Living arrangements for the past 2 months:  Single Family Home Source of Information:  Spouse Patient Interpreter Needed:  None Criminal Activity/Legal Involvement Pertinent to Current Situation/Hospitalization:  No - Comment as needed Significant Relationships:  Spouse Lives with:  Spouse Do you feel safe going back to the place where you live?  Yes Need for family participation in patient care:  Yes (Comment)  Care giving concerns: Patient fell at home in the bathroom.   Social Worker assessment / plan: BSW intern enters patient room, patient was sleeping is only alert and oriented to person only. Patient wife and son was in the room so BSW intern completed assessment with wife (shirley champson) . BSW intern explained PT recommendation and referral process. Patient wife states that she would like for the patient to go to a SNF in or near [place] . Patient wife does not want to do inpatient rehab because she is older and can not drive back and [place] to [place] to visit her husband. BSW intern will follow up with patient wife and son today about bed offers. Patient wife stated the patient also had a mini stroke and is high risk fall alert. Patient also has a external cath because, he is not able to get out of bed. Patient wife first choice is [facility] and her second choice is [facility].   Employment status:  Retired Product/process development scientistnsurance information:  Managed  Medicare PT Recommendations:  Inpatient Rehab Consult (patient wife wants SNF) Information / Referral to community resources:  Acute Rehab  Patient/Family's Response to care:  Patient wife has good response to care.  Patient/Family's Understanding of and Emotional Response to Diagnosis, Current Treatment, and Prognosis:  Patient has good insight to current condition and treatment plans.   Emotional Assessment Appearance:  Appears stated age Attitude/Demeanor/Rapport:  Unable to Assess Affect (typically observed):  Unable to Assess Orientation:  Oriented to Self Alcohol / Substance use:  Never Used Psych involvement (Current and /or in the community):  No (Comment)  Discharge Needs  Concerns to be addressed:  No discharge needs identified Readmission within the last 30 days:  No Current discharge risk:  None Barriers to Discharge:  No Barriers Identified   Catheryn Baconharlean Kayli Beal  BSW intern  [phone]

## 2015-10-01 NOTE — NC FL2 (Signed)
[place] MEDICAID FL2 LEVEL OF CARE SCREENING TOOL     IDENTIFICATION  Patient Name: Trevor Brock Birthdate: 1932-12-15 Sex: male Admission Date (Current Location): 09/28/2015  [facility] and [place] Number:  [place] television[place] and Address:  [facility] [place]. [facility], [address] [place], [place] [address]      Provider Number: [id number]  Attending Physician Name and Address:  Dorothea Ogle, MD  Relative Name and Phone Number:   Talbert Forest Cronce)    Current Level of Care: Hospital Recommended Level of Care: Skilled Nursing Facility Prior Approval Number:    Date Approved/Denied:   PASRR Number: [id number]  Discharge Plan: SNF    Current Diagnoses: Patient Active Problem List   Diagnosis Date Noted  . Syncope 09/28/2015  . Dementia 09/28/2015  . Assistance needed for mobility   . Hyperlipidemia   . Mild memory disturbance   . Hypothyroid   . OTHER SPECIFIED ANEMIAS 07/30/2009  . HEMOCCULT POSITIVE STOOL 04/29/2009    Orientation RESPIRATION BLADDER Height & Weight     Self  Normal External catheter Weight: 219 lb (99.338 kg) Height:  6' (182.9 cm)  BEHAVIORAL SYMPTOMS/MOOD NEUROLOGICAL BOWEL NUTRITION STATUS     (    Patient unable to follow commands. Cranial nerves II through XII are grossly intact.) Continent Diet (regular)  AMBULATORY STATUS COMMUNICATION OF NEEDS Skin   Limited Assist Verbally Normal                       Personal Care Assistance Level of Assistance  Bathing, Dressing, Feeding Bathing Assistance: Limited assistance Feeding assistance: Limited assistance Dressing Assistance: Limited assistance     Functional Limitations Info  Sight, Hearing, Speech Sight Info: Adequate Hearing Info: Adequate Speech Info: Adequate    SPECIAL CARE FACTORS FREQUENCY  PT (By licensed PT), OT (By licensed OT)     PT Frequency: Min 3X/week OT Frequency: Min 2X/week            Contractures      Additional Factors  Info  Code Status, Allergies Code Status Info: full Allergies Info: statins           Current Medications (10/01/2015):  This is the current hospital active medication list Current Facility-Administered Medications  Medication Dose Route Frequency Provider Last Rate Last Dose  . 0.9 %  sodium chloride infusion  75 mL/hr Intravenous Continuous Levie Heritage, DO 75 mL/hr at 10/01/15 0836 75 mL/hr at 10/01/15 0836  . aspirin tablet 325 mg  325 mg Oral Daily Rhona Raider Stinson, DO   325 mg at 10/01/15 1047  . ferrous sulfate tablet 325 mg  325 mg Oral q morning - 10a Rhona Raider Stinson, DO   325 mg at 10/01/15 1047  . levETIRAcetam (KEPPRA) 500 mg in sodium chloride 0.9 % 100 mL IVPB  500 mg Intravenous Q12H Rhona Raider Stinson, DO   500 mg at 10/01/15 0844  . levothyroxine (SYNTHROID, LEVOTHROID) tablet 112 mcg  112 mcg Oral QAC breakfast Levie Heritage, DO   112 mcg at 10/01/15 0820  . ondansetron (ZOFRAN) tablet 4 mg  4 mg Oral Q6H PRN Rhona Raider Stinson, DO       Or  . ondansetron [facility]) injection 4 mg  4 mg Intravenous Q6H PRN Rhona Raider Stinson, DO      . sertraline (ZOLOFT) tablet 50 mg  50 mg Oral Daily David L Rinehuls, PA-C   50 mg at 10/01/15 1047  Discharge Medications: Please see discharge summary for a list of discharge medications.  Relevant Imaging Results:  Relevant Lab Results:   Additional Information SS# [id number]  Mearl LatinNadia S Shereta Crothers, LCSWA

## 2015-10-01 NOTE — Progress Notes (Signed)
Speech Language Pathology Treatment: Dysphagia  Patient Details Name: Trevor Brock MRN: [id number] DOB: Apr 21, 1933 Today's Date: 10/01/2015 Time: 7494-4967 SLP Time Calculation (min) (ACUTE ONLY): 26 min  Assessment / Plan / Recommendation Clinical Impression  Pt demonstrates normal swallow function during am meal. SLP provided appropriate positioning and verbal cues to initiate meal. Provided supervision assist otherwise as pts mitts were removed during meal. Pt masticating without significant delay, but he is missing dentition, so recommend continuing dys 3 (mechanical soft) diet into the future. No further SLP intervention needed. Will sign off.    HPI HPI: Trevor Brock is a 82 y.o. male with baseline dementia, hypothyroidism, hyperlipidemia. The history is provided by the patient's wife due to dementia and the patient not being able to recall events. The patient had a syncopal episode approximately 4 PM. The patient had chemotherapy home from dinner, went to the bathroom. His wife heard him fall. When she went into the bathroom he was vomiting and having "jerking motions". The patient was lying on his side with no witnessed aspirations. EMS was called and the patient was transported to the hospital. Length of episode is uncertain. When EMS arrived, the patient had right-sided gaze and left-sided weakness and a code stroke was called. Patient was brought to the hospital for evaluation and had a negative CT scan. Neuro telemetry consulted to the patient and noticed that gaze preference had resolved. No obvious facial asymmetry. Per the patient's family, the patient is not at baseline currently cognitively. Current MRI and chest x-ray are showing no acute findings.        SLP Plan  Discharge SLP treatment due to (comment);All goals met     Recommendations  Diet recommendations: Dysphagia 3 (mechanical soft);Thin liquid Liquids provided via: Cup;Straw Medication Administration: Whole meds  with puree Supervision: Patient able to self feed Postural Changes and/or Swallow Maneuvers: Seated upright 90 degrees             Oral Care Recommendations: Oral care BID Follow up Recommendations: Skilled Nursing facility Plan: Discharge SLP treatment due to (comment);All goals met     GO               Trevor Baltimore, MA CCC-SLP [phone]  Trevor Brock 10/01/2015, 9:59 AM

## 2015-10-02 DIAGNOSIS — F0390 Unspecified dementia without behavioral disturbance: Secondary | ICD-10-CM | POA: Diagnosis not present

## 2015-10-02 DIAGNOSIS — E876 Hypokalemia: Secondary | ICD-10-CM | POA: Diagnosis not present

## 2015-10-02 DIAGNOSIS — R278 Other lack of coordination: Secondary | ICD-10-CM | POA: Diagnosis not present

## 2015-10-02 DIAGNOSIS — R262 Difficulty in walking, not elsewhere classified: Secondary | ICD-10-CM | POA: Diagnosis not present

## 2015-10-02 DIAGNOSIS — F329 Major depressive disorder, single episode, unspecified: Secondary | ICD-10-CM | POA: Diagnosis present

## 2015-10-02 DIAGNOSIS — Z9181 History of falling: Secondary | ICD-10-CM | POA: Diagnosis not present

## 2015-10-02 DIAGNOSIS — F0391 Unspecified dementia with behavioral disturbance: Secondary | ICD-10-CM | POA: Diagnosis not present

## 2015-10-02 DIAGNOSIS — D61818 Other pancytopenia: Secondary | ICD-10-CM | POA: Diagnosis not present

## 2015-10-02 DIAGNOSIS — Z23 Encounter for immunization: Secondary | ICD-10-CM | POA: Diagnosis not present

## 2015-10-02 DIAGNOSIS — E860 Dehydration: Secondary | ICD-10-CM | POA: Diagnosis not present

## 2015-10-02 DIAGNOSIS — E038 Other specified hypothyroidism: Secondary | ICD-10-CM | POA: Diagnosis not present

## 2015-10-02 DIAGNOSIS — E785 Hyperlipidemia, unspecified: Secondary | ICD-10-CM | POA: Diagnosis not present

## 2015-10-02 DIAGNOSIS — M6281 Muscle weakness (generalized): Secondary | ICD-10-CM | POA: Diagnosis not present

## 2015-10-02 DIAGNOSIS — G9340 Encephalopathy, unspecified: Secondary | ICD-10-CM | POA: Diagnosis not present

## 2015-10-02 DIAGNOSIS — E039 Hypothyroidism, unspecified: Secondary | ICD-10-CM | POA: Diagnosis not present

## 2015-10-02 DIAGNOSIS — R55 Syncope and collapse: Secondary | ICD-10-CM | POA: Diagnosis not present

## 2015-10-02 LAB — CBC
HCT: 39.8 % (ref 39.0–52.0)
HEMOGLOBIN: 13.1 g/dL (ref 13.0–17.0)
MCH: 31 pg (ref 26.0–34.0)
MCHC: 32.9 g/dL (ref 30.0–36.0)
MCV: 94.1 fL (ref 78.0–100.0)
PLATELETS: 52 10*3/uL — AB (ref 150–400)
RBC: 4.23 MIL/uL (ref 4.22–5.81)
RDW: 15.6 % — AB (ref 11.5–15.5)
WBC: 4.1 10*3/uL (ref 4.0–10.5)

## 2015-10-02 LAB — BASIC METABOLIC PANEL
Anion gap: 12 (ref 5–15)
BUN: 5 mg/dL — AB (ref 6–20)
CALCIUM: 8.6 mg/dL — AB (ref 8.9–10.3)
CO2: 25 mmol/L (ref 22–32)
CREATININE: 0.75 mg/dL (ref 0.61–1.24)
Chloride: 102 mmol/L (ref 101–111)
GFR calc non Af Amer: >60 mL/min (ref >60)
Glucose, Bld: 85 mg/dL (ref 65–99)
Potassium: 3.9 mmol/L (ref 3.5–5.1)
SODIUM: 139 mmol/L (ref 135–145)

## 2015-10-02 MED ORDER — LEVETIRACETAM 500 MG PO TABS
500.0000 mg | ORAL_TABLET | Freq: Two times a day (BID) | ORAL | Status: DC
  Administered 2015-10-02 – 2015-10-03 (×3): 500 mg via ORAL
  Filled 2015-10-02 (×3): qty 1

## 2015-10-02 MED ORDER — PNEUMOCOCCAL VAC POLYVALENT 25 MCG/0.5ML IJ INJ
0.5000 mL | INJECTION | INTRAMUSCULAR | Status: AC
  Administered 2015-10-03: 0.5 mL via INTRAMUSCULAR
  Filled 2015-10-02: qty 0.5

## 2015-10-02 NOTE — Progress Notes (Signed)
Latest CBC from Dr Marcelo BaldyHalls office to be faxed to 5W nurses station. Per RN his platelets were 79 on March 14th.

## 2015-10-02 NOTE — Progress Notes (Signed)
Physical Therapy Treatment Patient Details Name: Trevor Brock G [place] MRN: [id number] DOB: 01/14/1933 Today's Date: 10/02/2015    History of Present Illness 82 y.o. male with h/o dementia admitted with fall, vomiting and "jerking motions". Family reports 2 months of weakness/difficulty walking and that pt's cognitive status is worse than baseline.    PT Comments    Mr. Suezanne JacquetChampmon made progress today, ambulating 50 ft. He continues to require +2 mod assist for safe ambulation and inconsistently follows cues.  Pt will benefit from continued skilled PT services to increase functional independence and safety.   Follow Up Recommendations  SNF;Supervision/Assistance - 24 hour     Equipment Recommendations  Wheelchair (measurements PT);Wheelchair cushion (measurements PT)    Recommendations for Other Services       Precautions / Restrictions Precautions Precautions: Fall Precaution Comments: 5 falls in past 2 months Restrictions Weight Bearing Restrictions: No    Mobility  Bed Mobility Overal bed mobility: Needs Assistance Bed Mobility: Supine to Sit     Supine to sit: Min assist;HOB elevated     General bed mobility comments: Increased time and effort and heavy use of bed rail w/ HOB elevated.  Close min guard assist.   Transfers Overall transfer level: Needs assistance Equipment used: Rolling walker (2 wheeled) Transfers: Sit to/from Stand Sit to Stand: +2 physical assistance;Min assist         General transfer comment: Assist to boost up to standing and multimodal cues to sit.  Pt not following commands to sit but begins to sit once hands are placed on armrests.    Ambulation/Gait Ambulation/Gait assistance: Mod assist;+2 safety/equipment;+2 physical assistance Ambulation Distance (Feet): 50 Feet Assistive device: Rolling walker (2 wheeled) Gait Pattern/deviations: Decreased stride length;Shuffle;Antalgic;Trunk flexed   Gait velocity interpretation: <1.8 ft/sec,  indicative of risk for recurrent falls General Gait Details: Unsteady gait and mod assist for RW management as pt initially running into it and then pushing it too far ahead.     Stairs            Wheelchair Mobility    Modified Rankin (Stroke Patients Only)       Balance Overall balance assessment: Needs assistance;History of Falls Sitting-balance support: Bilateral upper extremity supported;Feet supported Sitting balance-Leahy Scale: Fair Sitting balance - Comments: Close min guard assist, pt fatigues sitting EOB x5 minutes   Standing balance support: Bilateral upper extremity supported;During functional activity Standing balance-Leahy Scale: Poor Standing balance comment: Bil UE support on RW and outside assist to steady                    Cognition Arousal/Alertness: Awake/alert Behavior During Therapy: WFL for tasks assessed/performed Overall Cognitive Status: Impaired/Different from baseline Area of Impairment: Following commands;Safety/judgement;Awareness;Problem solving;Memory       Following Commands: Follows one step commands inconsistently Safety/Judgement: Decreased awareness of safety;Decreased awareness of deficits   Problem Solving: Slow processing;Decreased initiation;Difficulty sequencing;Requires verbal cues;Requires tactile cues General Comments: Difficult to fully asses secondary to pt very HOH.    Exercises      General Comments General comments (skin integrity, edema, etc.): Educated family on importance of having nursing staff for OOB or chair to decrease fall risk, family verbalized understanding and agreement.      Pertinent Vitals/Pain Pain Assessment: Faces Faces Pain Scale: Hurts even more Pain Location: penis w/ removal of condom cath by RN Pain Descriptors / Indicators: Grimacing;Moaning Pain Intervention(s): Limited activity within patient's tolerance;Monitored during session;Repositioned    Home Living  Prior Function            PT Goals (current goals can now be found in the care plan section) Acute Rehab PT Goals Patient Stated Goal: improve strength/walking per family PT Goal Formulation: With family Time For Goal Achievement: 10/14/15 Potential to Achieve Goals: Fair Progress towards PT goals: Progressing toward goals    Frequency  Min 3X/week    PT Plan Current plan remains appropriate    Co-evaluation             End of Session Equipment Utilized During Treatment: Gait belt Activity Tolerance: Patient limited by fatigue;Patient tolerated treatment well Patient left: in chair;with call bell/phone within reach;with chair alarm set;with family/visitor present     Time: 1200-1230 PT Time Calculation (min) (ACUTE ONLY): 30 min  Charges:  $Gait Training: 8-22 mins $Therapeutic Activity: 8-22 mins                    G Codes:      Encarnacion Chu PT, DPT  Pager: [phone] Phone: [phone] 10/02/2015, 1:40 PM

## 2015-10-02 NOTE — Progress Notes (Addendum)
Patient ID: Trevor Brock, male   DOB: 04-16-1933, 82 y.o.   MRN: [id number] [facility] PROGRESS NOTE  Trevor Brock [id number] DOB: 04-16-1933 DOA: 09/28/2015  PCP: Dr. Shelva MajesticZachary Hall   Brief narrative:    82 y.o. male with baseline dementia, hypothyroidism, hyperlipidemia, recently taken off statins due to lower extremity weakness (per son), [facility] ED for evaluation of ? Syncopal event. Pt apparently did well prior to the event and after dinner went to bathroom and wife heard him fall. When she came to bathroom pt was vomiting and was having "jerking like movements in lower extremities". Upon EMS arrival, the patient had right-sided gaze and left-sided weakness and a code stroke was called. Patient was brought to the hospital for evaluation and had a negative CT scan. [facility] asked to for further evaluation   Assessment/Plan:    Active Problems:   Syncope - unclear etiology at this time, ? Dehydration, ? Orthostatics  - UA and CXR with no clear acute abnormality to explain the event - not clear if truly seizure episode, neurologist consulted for assistance, appreciate assistance  - EEG with mild to moderate encephalopathic changes  - pt more alert this AM, follows most of the commands - convert to PO meds and if pt doing well, can be d/c to SNF in AM    Poor oral intake - spoke with son and wife at bedside, pt has not been eating and mostly due to persistent confusion - needs lots of enforcement, encouragement, re direction to chew and swallow - today pt is more alert and I feel more confident that we can transition to oral Keppra and stop IVF - if pt continues to improve, I believe he will be ready for d/c in AM     Hyperlipidemia - statins have been recently d/c due to weakness - would not restart     Hypothyroid - continue synthroid     Hypokalemia - mild, supplemented and WNL     Dementia - more clear and alert this AM, son says close to baseline    Pancytopenia  - blood counts low in all three cell lines, plt improving  - oncology recommends observation for now - CBC in AM  DVT prophylaxis - SCD's  Code Status: Full.  Family Communication:  plan of care discussed with wife and son over the phone  Disposition Plan: likely SNF in AM 10/03/2015  IV access:  Peripheral IV  Procedures and diagnostic studies:    Ct Head Wo Contrast 09/28/2015 No acute intracranial abnormality. No intracranial mass, hemorrhage or edema. 2. Soft tissue edema/hematoma overlying the right frontal bone. No underlying fracture. These results were called by telephone at the time of interpretation on 09/28/2015 at 5:16 pm to Dr. Samuel JesterKATHLEEN MCMANUS , who verbally acknowledged these results.   Mr Laqueta JeanBrain W Wo Contrast 09/29/2015  No acute intracranial process. Mild chronic small vessel ischemic disease and old small RIGHT cerebellar infarcts. Scalp soft tissue swelling.   Dg Chest Port 1 View 09/28/2015 Mild cardiac enlargement otherwise no acute findings  Medical Consultants:  Neurology   Other Consultants:  PT  IAnti-Infectives:   None  Debbora PrestoMAGICK-Yalanda Soderman, MD  [facility] Pager [phone]  If 7PM-7AM, please contact night-coverage [email or web address] Password [email or web address] 10/02/2015, 1:06 PM  HPI/Subjective: No events overnight.   Objective: Filed Vitals:   10/01/15 0539 10/01/15 1415 10/01/15 2128 10/02/15 0512  BP: 141/39 149/63 151/62 150/60  Pulse: 68 66 62 60  Temp: 97.7 F (36.5 C)  97.3 F (36.3 C) 99.2 F (37.3 C) 98.5 F (36.9 C)  TempSrc:  Oral Oral   Resp: Height:      Weight:      SpO2: 100% 97%  98%    Intake/Output Summary (Last 24 hours) at 10/02/15 1306 Last data filed at 10/02/15 7829  Gross per 24 hour  Intake      0 ml  Output   1800 ml  Net  -1800 ml    Exam:   General:  Pt is awake, alert, follows commands   Cardiovascular: Regular rate and rhythm, no rubs, no gallops  Respiratory: Clear to auscultation bilaterally, no  wheezing, diminished breath sounds at bases   Abdomen: Soft, non tender, non distended, bowel sounds present, no guarding  Extremities: pulses DP and PT palpable bilaterally   Data Reviewed: Basic Metabolic Panel:  Recent Labs Lab 09/28/15 1700 09/28/15 1737 09/30/15 0640 10/01/15 0442 10/02/15 0602  NA 138 139 138 135 139  K 3.5 3.4* 3.8 3.6 3.9  CL 105 101 105 102 102  CO2 20*  --  GLUCOSE 107* 105* 91 89 85  BUN 9 8 5* 6 5*  CREATININE 0.85 0.80 0.70 0.75 0.75  CALCIUM 8.4*  --  8.3* 8.4* 8.6*   Liver Function Tests:  Recent Labs Lab 09/28/15 1700  AST 51*  ALT 25  ALKPHOS 178*  BILITOT 0.8  PROT 6.4*  ALBUMIN 3.5   CBC:  Recent Labs Lab 09/28/15 1700 09/28/15 1737 09/30/15 0640 10/01/15 0442 10/02/15 0602  WBC 5.8  --  3.2* 3.8* 4.1  NEUTROABS 2.0  --   --   --   --   HGB 13.0 13.9 12.0* 12.5* 13.1  HCT 39.7 41.0 36.2* 38.0* 39.8  MCV 95.4  --  95.0 95.0 94.1  PLT 67*  --  50* 50* 52*   Cardiac Enzymes:  Recent Labs Lab 09/28/15 1701  CKTOTAL 57   CBG:  Recent Labs Lab 09/28/15 1711  GLUCAP 100*   Scheduled Meds: . aspirin  325 mg Oral Daily  . ferrous sulfate  325 mg Oral q morning - 10a  . levETIRAcetam  500 mg Intravenous Q12H  . levothyroxine  112 mcg Oral QAC breakfast  . sertraline  50 mg Oral Daily   Continuous Infusions: . sodium chloride 75 mL/hr (10/02/15 0035)

## 2015-10-02 NOTE — Progress Notes (Signed)
Called office of Dr Meridee ScoreZach Hal [phone] to request CBC. Office closed till 2:00 for lunch.

## 2015-10-03 ENCOUNTER — Inpatient Hospital Stay
Admission: RE | Admit: 2015-10-03 | Discharge: 2015-10-16 | Disposition: A | Discharge status: Other Acute Inpatient Hospital | Payer: Medicare Other | Source: Ambulatory Visit | Attending: Internal Medicine | Admitting: Internal Medicine

## 2015-10-03 DIAGNOSIS — G9341 Metabolic encephalopathy: Secondary | ICD-10-CM | POA: Diagnosis not present

## 2015-10-03 DIAGNOSIS — G4089 Other seizures: Secondary | ICD-10-CM | POA: Diagnosis present

## 2015-10-03 DIAGNOSIS — E785 Hyperlipidemia, unspecified: Secondary | ICD-10-CM | POA: Diagnosis not present

## 2015-10-03 DIAGNOSIS — E440 Moderate protein-calorie malnutrition: Secondary | ICD-10-CM | POA: Diagnosis not present

## 2015-10-03 DIAGNOSIS — L0390 Cellulitis, unspecified: Secondary | ICD-10-CM | POA: Diagnosis not present

## 2015-10-03 DIAGNOSIS — S0003XA Contusion of scalp, initial encounter: Secondary | ICD-10-CM | POA: Diagnosis not present

## 2015-10-03 DIAGNOSIS — F0391 Unspecified dementia with behavioral disturbance: Secondary | ICD-10-CM | POA: Diagnosis not present

## 2015-10-03 DIAGNOSIS — R569 Unspecified convulsions: Secondary | ICD-10-CM | POA: Diagnosis not present

## 2015-10-03 DIAGNOSIS — G9340 Encephalopathy, unspecified: Secondary | ICD-10-CM | POA: Diagnosis not present

## 2015-10-03 DIAGNOSIS — E038 Other specified hypothyroidism: Secondary | ICD-10-CM | POA: Diagnosis not present

## 2015-10-03 DIAGNOSIS — E860 Dehydration: Secondary | ICD-10-CM | POA: Diagnosis not present

## 2015-10-03 DIAGNOSIS — H5702 Anisocoria: Secondary | ICD-10-CM | POA: Diagnosis not present

## 2015-10-03 DIAGNOSIS — E876 Hypokalemia: Secondary | ICD-10-CM | POA: Diagnosis not present

## 2015-10-03 DIAGNOSIS — Z9181 History of falling: Secondary | ICD-10-CM | POA: Diagnosis not present

## 2015-10-03 DIAGNOSIS — Z23 Encounter for immunization: Secondary | ICD-10-CM | POA: Diagnosis not present

## 2015-10-03 DIAGNOSIS — E872 Acidosis: Secondary | ICD-10-CM | POA: Diagnosis not present

## 2015-10-03 DIAGNOSIS — R278 Other lack of coordination: Secondary | ICD-10-CM | POA: Diagnosis not present

## 2015-10-03 DIAGNOSIS — R4182 Altered mental status, unspecified: Secondary | ICD-10-CM | POA: Diagnosis present

## 2015-10-03 DIAGNOSIS — R627 Adult failure to thrive: Secondary | ICD-10-CM | POA: Diagnosis not present

## 2015-10-03 DIAGNOSIS — I10 Essential (primary) hypertension: Secondary | ICD-10-CM | POA: Diagnosis not present

## 2015-10-03 DIAGNOSIS — D61818 Other pancytopenia: Secondary | ICD-10-CM | POA: Diagnosis not present

## 2015-10-03 DIAGNOSIS — R296 Repeated falls: Secondary | ICD-10-CM | POA: Diagnosis not present

## 2015-10-03 DIAGNOSIS — R262 Difficulty in walking, not elsewhere classified: Secondary | ICD-10-CM | POA: Diagnosis not present

## 2015-10-03 DIAGNOSIS — R41841 Cognitive communication deficit: Secondary | ICD-10-CM | POA: Diagnosis present

## 2015-10-03 DIAGNOSIS — L03317 Cellulitis of buttock: Secondary | ICD-10-CM | POA: Diagnosis not present

## 2015-10-03 DIAGNOSIS — E039 Hypothyroidism, unspecified: Secondary | ICD-10-CM | POA: Diagnosis not present

## 2015-10-03 DIAGNOSIS — Z6827 Body mass index (BMI) 27.0-27.9, adult: Secondary | ICD-10-CM | POA: Diagnosis not present

## 2015-10-03 DIAGNOSIS — S0990XA Unspecified injury of head, initial encounter: Secondary | ICD-10-CM | POA: Diagnosis not present

## 2015-10-03 DIAGNOSIS — F329 Major depressive disorder, single episode, unspecified: Secondary | ICD-10-CM | POA: Diagnosis present

## 2015-10-03 DIAGNOSIS — L0231 Cutaneous abscess of buttock: Secondary | ICD-10-CM | POA: Diagnosis not present

## 2015-10-03 DIAGNOSIS — R413 Other amnesia: Secondary | ICD-10-CM | POA: Diagnosis not present

## 2015-10-03 DIAGNOSIS — F0390 Unspecified dementia without behavioral disturbance: Secondary | ICD-10-CM | POA: Diagnosis not present

## 2015-10-03 DIAGNOSIS — D696 Thrombocytopenia, unspecified: Secondary | ICD-10-CM | POA: Diagnosis not present

## 2015-10-03 DIAGNOSIS — M6281 Muscle weakness (generalized): Secondary | ICD-10-CM | POA: Diagnosis not present

## 2015-10-03 DIAGNOSIS — A419 Sepsis, unspecified organism: Secondary | ICD-10-CM | POA: Diagnosis not present

## 2015-10-03 DIAGNOSIS — R55 Syncope and collapse: Secondary | ICD-10-CM | POA: Diagnosis not present

## 2015-10-03 LAB — BASIC METABOLIC PANEL
Anion gap: 13 (ref 5–15)
BUN: 6 mg/dL (ref 6–20)
CHLORIDE: 101 mmol/L (ref 101–111)
CO2: 23 mmol/L (ref 22–32)
Calcium: 8.5 mg/dL — ABNORMAL LOW (ref 8.9–10.3)
Creatinine, Ser: 0.8 mg/dL (ref 0.61–1.24)
GFR calc Af Amer: >60 mL/min (ref >60)
GFR calc non Af Amer: >60 mL/min (ref >60)
GLUCOSE: 101 mg/dL — AB (ref 65–99)
POTASSIUM: 3.4 mmol/L — AB (ref 3.5–5.1)
Sodium: 137 mmol/L (ref 135–145)

## 2015-10-03 LAB — CBC WITH DIFFERENTIAL/PLATELET
Basophils Absolute: 0.1 10*3/uL (ref 0.0–0.1)
Basophils Relative: 1 %
Eosinophils Absolute: 0.3 10*3/uL (ref 0.0–0.7)
Eosinophils Relative: 7 %
HCT: 38 % — ABNORMAL LOW (ref 39.0–52.0)
HEMOGLOBIN: 12.8 g/dL — AB (ref 13.0–17.0)
LYMPHS ABS: 1.5 10*3/uL (ref 0.7–4.0)
LYMPHS PCT: 32 %
MCH: 31.5 pg (ref 26.0–34.0)
MCHC: 33.7 g/dL (ref 30.0–36.0)
MCV: 93.6 fL (ref 78.0–100.0)
Monocytes Absolute: 0.4 10*3/uL (ref 0.1–1.0)
Monocytes Relative: 9 %
NEUTROS PCT: 51 %
Neutro Abs: 2.4 10*3/uL (ref 1.7–7.7)
Platelets: 59 10*3/uL — ABNORMAL LOW (ref 150–400)
RBC: 4.06 MIL/uL — AB (ref 4.22–5.81)
RDW: 15.7 % — ABNORMAL HIGH (ref 11.5–15.5)
WBC: 4.7 10*3/uL (ref 4.0–10.5)

## 2015-10-03 LAB — HEPATIC FUNCTION PANEL: BILIRUBIN, TOTAL: 0.8 mg/dL

## 2015-10-03 MED ORDER — LEVETIRACETAM 500 MG PO TABS: 500.0000 mg | ORAL_TABLET | Freq: Two times a day (BID) | ORAL | Status: DC

## 2015-10-03 MED ORDER — POTASSIUM CHLORIDE CRYS ER 20 MEQ PO TBCR
40.0000 meq | EXTENDED_RELEASE_TABLET | Freq: Once | ORAL | Status: AC
  Administered 2015-10-03: 40 meq via ORAL
  Filled 2015-10-03: qty 2

## 2015-10-03 MED ORDER — SERTRALINE HCL 50 MG PO TABS: 50.0000 mg | ORAL_TABLET | Freq: Every day | ORAL | Status: DC

## 2015-10-03 NOTE — Progress Notes (Signed)
[facility]. No apparent distress noted. Family will transport. Report given using SBAR to R.R. DonnelleyKisha Corvett-LPN of [facility]. All questions were answered. Original prescription given to the patient.

## 2015-10-03 NOTE — Discharge Instructions (Signed)
Seizure, Adult °A seizure means there is unusual activity in the brain. A seizure can cause changes in attention or behavior. Seizures often cause shaking (convulsions). Seizures often last from 30 seconds to 2 minutes. °HOME CARE  °· If you are given medicines, take them exactly as told by your doctor. °· Keep all doctor visits as told. °· Do not swim or drive until your doctor says it is okay. °· Teach others what to do if you have a seizure. They should: °¨ Lay you on the ground. °¨ Put a cushion under your head. °¨ Loosen any tight clothing around your neck. °¨ Turn you on your side. °¨ Stay with you until you get better. °GET HELP RIGHT AWAY IF:  °· The seizure lasts longer than 2 to 5 minutes. °· The seizure is very bad. °· The person does not wake up after the seizure. °· The person's attention or behavior changes. °Drive the person to the emergency room or call your local emergency services (911 in [place] °MAKE SURE YOU:  °· Understand these instructions. °· Will watch your condition. °· Will get help right away if you are not doing well or get worse. °  °This information is not intended to replace advice given to you by your health care provider. Make sure you discuss any questions you have with your health care provider. °  °Document Released: 12/02/2007 Document Revised: 09/07/2011 Document Reviewed: 01/25/2013 °Elsevier Interactive Patient Education ©2016 Elsevier Inc. ° °

## 2015-10-03 NOTE — Progress Notes (Addendum)
Occupational Therapy Treatment Patient Details Name: TRAMMELL BOWDEN MRN: [id number] DOB: [birth year] Today's Date: 10/03/2015    History of present illness 82 y.o. male with h/o dementia admitted with fall, vomiting and "jerking motions". Family reports 2 months of weakness/difficulty walking and that pt's cognitive status is worse than baseline.   OT comments  This 82 yo male admitted for above presents to acute OT with making progress with grooming and transfers. He will continue to benefit from OT at SNF to get to a more independent level with hopeful return home with family.  Follow Up Recommendations  SNF;Supervision/Assistance - 24 hour    Equipment Recommendations   (TBD at next venue)       Precautions / Restrictions Precautions Precautions: Fall Precaution Comments: 5 falls in past 2 months Restrictions Weight Bearing Restrictions: No       Mobility Bed Mobility Overal bed mobility: Needs Assistance Bed Mobility: Supine to Sit     Supine to sit: Mod assist;HOB elevated     General bed mobility comments: Increased time and effort due to pt not really wanting to get OOB even though it was 12 noon  Transfers Overall transfer level: Needs assistance Equipment used: Rolling walker (2 wheeled) Transfers: Sit to/from Stand Sit to Stand: +2 physical assistance;Min assist         General transfer comment: Pt ambulated with +2 min A 100 feet with RW with A to keep RW closer to him (pt's son-in-law reports that pt had just started using a RW and thus was not very familar with one)    Balance Overall balance assessment: Needs assistance;History of Falls Sitting-balance support: No upper extremity supported;Feet supported Sitting balance-Leahy Scale: Fair Sitting balance - Comments:  sitting EOB x3 minutes   Standing balance support: Bilateral upper extremity supported;During functional activity Standing balance-Leahy Scale: Poor Standing balance comment: Reliant on  RW or Bil HHA                   ADL Overall ADL's : Needs assistance/impaired     Grooming: Wash/dry face;Sitting (in Medical illustrator)                   Toilet Transfer: Minimal assistance;+2 for physical assistance;Ambulation;RW Toilet Transfer Details (indicate cue type and reason): Bed>ambulated 100 feet with RW>sit in recliner and return to room via recliner                            Cognition   Behavior During Therapy: Agitated (did not want to get up) Overall Cognitive Status: Impaired/Different from baseline Area of Impairment: Following commands;Safety/judgement        Following Commands: Follows one step commands with increased time;Follows one step commands inconsistently Safety/Judgement: Decreased awareness of safety;Decreased awareness of deficits (with RW use (keeps too far ahead of him))                       Pertinent Vitals/ Pain       Pain Assessment: No/denies pain         Frequency Min 2X/week     Progress Toward Goals  OT Goals(current goals can now be found in the care plan section)  Progress towards OT goals: Progressing toward goals     Plan Discharge plan remains appropriate       End of Session Equipment Utilized During Treatment: Gait belt;Rolling walker   Activity Tolerance Patient tolerated treatment well  Patient Left in chair;with call bell/phone within reach;with chair alarm set;with family/visitor present   Nurse Communication          Time: 1610-96041147-1204 OT Time Calculation (min): 17 min  Charges: OT General Charges $OT Visit: 1 Procedure OT Treatments $Self Care/Home Management : 8-22 mins  Evette GeorgesLeonard, Sabastion Hrdlicka Eva [phone] 10/03/2015, 12:18 PM

## 2015-10-03 NOTE — Progress Notes (Signed)
[facility] Anticipated discharge date:4/6 Family notified: at bedside Transportation by family- RN to release when ready  CSW signing off.  Merlyn LotJenna Holoman, LCSWA Clinical Social Worker [phone]

## 2015-10-03 NOTE — Discharge Summary (Addendum)
Physician Discharge Summary  Trevor Brock ZOX:[id number] DOB: 12-31-1932 DOA: 09/28/2015  PCP: Dr. Shelva Brock  Admit date: 09/28/2015 Discharge date: 10/03/2015  Recommendations for Outpatient Follow-up:  1. Pt will need to follow up with PCP in 2-3 weeks post discharge 2. Please obtain BMP to evaluate electrolytes and kidney function 3. Please also check CBC to evaluate Hg and Hct levels, Plt levels   Discharge Diagnoses:  Active Problems:   Syncope   Hyperlipidemia   Hypothyroid   Dementia  Discharge Condition: Stable  Diet recommendation: Heart healthy diet discussed in details    Brief narrative:    82 y.o. male with baseline dementia, hypothyroidism, hyperlipidemia, recently taken off statins due to lower extremity weakness (per son), presented to [facility] ED for evaluation of ? Syncopal event. Pt apparently did well prior to the event and after dinner went to bathroom and wife heard him fall. When she came to bathroom pt was vomiting and was having "jerking like movements in lower extremities". Upon EMS arrival, the patient had right-sided gaze and left-sided weakness and a code stroke was called. Patient was brought to the hospital for evaluation and had a negative CT scan. TRH asked to for further evaluation   Assessment/Plan:    Active Problems:  Syncope - unclear etiology at this time, ? Dehydration, ? Orthostatics  - UA and CXR with no clear acute abnormality to explain the event - not clear if truly seizure episode, neurologist consulted for assistance, appreciate assistance  - EEG with mild to moderate encephalopathic changes  - pt more alert this AM, follows most of the commands - tolerating PO   Poor oral intake - better this AM, tolerating PO   Hyperlipidemia - statins have been recently d/c due to weakness - would not restart    Hypothyroid - continue synthroid    Hypokalemia - mild, supplemented prior to discharge    Dementia - more  clear and alert this AM, son says close to baseline    Pancytopenia  - improving, no plan for further intervention at this time   Code Status: Full.  Family Communication: plan of care discussed with wife and son over the phone  Disposition Plan: SNF   IV access:  Peripheral IV  Procedures and diagnostic studies:   Ct Head Wo Contrast 09/28/2015 No acute intracranial abnormality. No intracranial mass, hemorrhage or edema. 2. Soft tissue edema/hematoma overlying the right frontal bone. No underlying fracture. These results were called by telephone at the time of interpretation on 09/28/2015 at 5:16 pm to Dr. Samuel Jester , who verbally acknowledged these results.   [name] 09/29/2015 No acute intracranial process. Mild chronic small vessel ischemic disease and old small RIGHT cerebellar infarcts. Scalp soft tissue swelling.   Dg Chest Port 1 View 09/28/2015 Mild cardiac enlargement otherwise no acute findings  Medical Consultants:  Neurology   Other Consultants:  PT  IAnti-Infectives:   None  Debbora Presto, MD [facility] Pager [phone]      Discharge Exam: Filed Vitals:   10/02/15 2157 10/03/15 0453  BP: 137/58 115/45  Pulse: 70 74  Temp: 97.3 F (36.3 C) 98.1 F (36.7 C)  Resp: 20 18   Filed Vitals:   10/02/15 0512 10/02/15 1549 10/02/15 2157 10/03/15 0453  BP: 150/60 144/58 137/58 115/45  Pulse: 60 70 70 74  Temp: 98.5 F (36.9 C) 97.9 F (36.6 C) 97.3 F (36.3 C) 98.1 F (36.7 C)  TempSrc:  Oral Oral Oral  Resp: 18 18 20 18   Height:      Weight:      SpO2: 98% 97% 96% 94%    General: Pt is alert, not in acute distress, still intermittently confused  Cardiovascular: Regular rate and rhythm, no rubs, no gallops Respiratory: Clear to auscultation bilaterally, no wheezing, no crackles, no rhonchi Abdominal: Soft, non tender, non distended, bowel sounds +, no guarding   Discharge Instructions  Discharge  Instructions    Diet - low sodium heart healthy    Complete by:  As directed      Increase activity slowly    Complete by:  As directed             Medication List    TAKE these medications        ferrous sulfate 325 (65 FE) MG tablet  Take 325 mg by mouth every morning.     levETIRAcetam 500 MG tablet  Commonly known as:  KEPPRA  Take 1 tablet (500 mg total) by mouth 2 (two) times daily.     levothyroxine 112 MCG tablet  Commonly known as:  SYNTHROID, LEVOTHROID     sertraline 50 MG tablet  Commonly known as:  ZOLOFT  Take 1 tablet (50 mg total) by mouth daily.          The results of significant diagnostics from this hospitalization (including imaging, microbiology, ancillary and laboratory) are listed below for reference.     Microbiology: No results found for this or any previous visit (from the past 240 hour(s)).   Labs: Basic Metabolic Panel:  Recent Labs Lab 09/28/15 1700 09/28/15 1737 09/30/15 0640 10/01/15 0442 10/02/15 0602 10/03/15 0601  NA 138 139 138 135 139 137  K 3.5 3.4* 3.8 3.6 3.9 3.4*  CL 105 101 105 102 102 101  CO2 20*  --  23 22 25 23   GLUCOSE 107* 105* 91 89 85 101*  BUN 9 8 5* 6 5* 6  CREATININE 0.85 0.80 0.70 0.75 0.75 0.80  CALCIUM 8.4*  --  8.3* 8.4* 8.6* 8.5*   Liver Function Tests:  Recent Labs Lab 09/28/15 1700  AST 51*  ALT 25  ALKPHOS 178*  BILITOT 0.8  PROT 6.4*  ALBUMIN 3.5   CBC:  Recent Labs Lab 09/28/15 1700 09/28/15 1737 09/30/15 0640 10/01/15 0442 10/02/15 0602 10/03/15 0601  WBC 5.8  --  3.2* 3.8* 4.1 4.7  NEUTROABS 2.0  --   --   --   --  2.4  HGB 13.0 13.9 12.0* 12.5* 13.1 12.8*  HCT 39.7 41.0 36.2* 38.0* 39.8 38.0*  MCV 95.4  --  95.0 95.0 94.1 93.6  PLT 67*  --  50* 50* 52* 59*   Cardiac Enzymes:  Recent Labs Lab 09/28/15 1701  CKTOTAL 57    CBG:  Recent Labs Lab 09/28/15 1711  GLUCAP 100*     SIGNED: Time coordinating discharge:  30 minutes  MAGICK-Markia Kyer,  MD  [facility] 10/03/2015, 10:46 AM Pager [phone]  If 7PM-7AM, please contact night-coverage [email or web address] Password [email or web address]

## 2015-10-03 NOTE — Clinical Social Work Placement (Signed)
   CLINICAL SOCIAL WORK PLACEMENT  NOTE  Date:  10/03/2015  Patient Details  Name: Trevor Brock G Calixto MRN: [id number] Date of Birth: 1932-09-12  Clinical Social Work is seeking post-discharge placement for this patient at the Skilled  Nursing Facility level of care (*CSW will initial, date and re-position this form in  chart as items are completed):  Yes   Patient/family provided with [facility] Clinical Social Work Department's list of facilities offering this level of care within the geographic area requested by the patient (or if unable, by the patient's family).  Yes   Patient/family informed of their freedom to choose among providers that offer the needed level of care, that participate in Medicare, Medicaid or managed care program needed by the patient, have an available bed and are willing to accept the patient.  Yes   Patient/family informed of [facility] ownership interest [facility] and [facility], as well as of the fact that they are under no obligation to receive care at these facilities.  PASRR submitted to EDS on 10/01/15     PASRR number received on 10/01/15     Existing PASRR number confirmed on       FL2 transmitted to all facilities in geographic area requested by pt/family on 10/01/15     FL2 transmitted to all facilities within larger geographic area on       Patient informed that his/her managed care company has contracts with or will negotiate with certain facilities, including the following:        Yes   Patient/family informed of bed offers received.  [facility]     Physician recommends and patient chooses bed at      [facility] 10[facility]03/15.  Patient to be transferred to facility by Family     Patient family notified on 10/03/15 of transfer.  Name of family member notified:  Talbert ForestShirley     PHYSICIAN Please sign FL2     Additional Comment:     _______________________________________________ Izora RibasHoloman, Dahl Higinbotham M, LCSW 10/03/2015, 1:23 PM

## 2015-10-03 NOTE — Care Management Note (Signed)
Case Management Note  Patient Details  Name: Trevor Brock MRN: [id number] Date of Birth: 10-Feb-1933  Subjective/Objective:                 Admitted with syncope.   Action/Plan: Plan to d/c to SNF today. CM with no further needs identified.  Expected Discharge Date:     10/03/2015             Expected Discharge Plan:  Skilled Nursing Facility  In-House Referral:  Clinical Social Work  Discharge planning Services  CM Consult  Post Acute Care Choice:    Choice offered to:     DME Arranged:    DME Agency:     HH Arranged:    HH Agency:     Status of Service:  Completed, signed off   Gae GallopCole, Nekeya Briski DavisHudson, ArizonaRN,BSN,CM [phone] 10/03/2015, 11:04 AM

## 2015-10-04 ENCOUNTER — Encounter: Payer: Self-pay | Admitting: Internal Medicine

## 2015-10-04 ENCOUNTER — Non-Acute Institutional Stay (SKILLED_NURSING_FACILITY): Payer: Medicare Other | Admitting: Internal Medicine

## 2015-10-04 ENCOUNTER — Encounter (HOSPITAL_COMMUNITY)
Admission: RE | Admit: 2015-10-04 | Discharge: 2015-10-04 | Disposition: A | Discharge status: Home / Self Care | Payer: Medicare Other | Source: Skilled Nursing Facility | Attending: Internal Medicine | Admitting: Internal Medicine

## 2015-10-04 DIAGNOSIS — R569 Unspecified convulsions: Secondary | ICD-10-CM

## 2015-10-04 DIAGNOSIS — R55 Syncope and collapse: Secondary | ICD-10-CM

## 2015-10-04 DIAGNOSIS — M6281 Muscle weakness (generalized): Secondary | ICD-10-CM | POA: Insufficient documentation

## 2015-10-04 DIAGNOSIS — F0390 Unspecified dementia without behavioral disturbance: Secondary | ICD-10-CM | POA: Diagnosis not present

## 2015-10-04 DIAGNOSIS — E038 Other specified hypothyroidism: Secondary | ICD-10-CM | POA: Diagnosis not present

## 2015-10-04 DIAGNOSIS — E785 Hyperlipidemia, unspecified: Secondary | ICD-10-CM

## 2015-10-04 DIAGNOSIS — E031 Congenital hypothyroidism without goiter: Secondary | ICD-10-CM | POA: Insufficient documentation

## 2015-10-04 LAB — CBC WITH DIFFERENTIAL/PLATELET
Basophils Absolute: 0 10*3/uL (ref 0.0–0.1)
Basophils Relative: 1 %
EOS ABS: 0.4 10*3/uL (ref 0.0–0.7)
EOS PCT: 7 %
HCT: 41.5 % (ref 39.0–52.0)
Hemoglobin: 13.7 g/dL (ref 13.0–17.0)
LYMPHS ABS: 1.5 10*3/uL (ref 0.7–4.0)
LYMPHS PCT: 27 %
MCH: 31.3 pg (ref 26.0–34.0)
MCHC: 33 g/dL (ref 30.0–36.0)
MCV: 94.7 fL (ref 78.0–100.0)
MONOS PCT: 6 %
Monocytes Absolute: 0.3 10*3/uL (ref 0.1–1.0)
Neutro Abs: 3.4 10*3/uL (ref 1.7–7.7)
Neutrophils Relative %: 60 %
Platelets: 73 10*3/uL — ABNORMAL LOW (ref 150–400)
RBC: 4.38 MIL/uL (ref 4.22–5.81)
RDW: 15.8 % — ABNORMAL HIGH (ref 11.5–15.5)
WBC: 5.7 10*3/uL (ref 4.0–10.5)

## 2015-10-04 LAB — BASIC METABOLIC PANEL
Anion gap: 9 (ref 5–15)
BUN: 9 mg/dL (ref 4–21)
BUN: 9 mg/dL (ref 6–20)
CHLORIDE: 102 mmol/L (ref 101–111)
CO2: 25 mmol/L (ref 22–32)
Calcium: 8.4 mg/dL — ABNORMAL LOW (ref 8.9–10.3)
Creatinine, Ser: 0.72 mg/dL (ref 0.61–1.24)
Creatinine: 0.7 mg/dL (ref <=1.3)
GFR calc Af Amer: >60 mL/min (ref >60)
GFR calc non Af Amer: >60 mL/min (ref >60)
GLUCOSE: 105 mg/dL
Glucose, Bld: 105 mg/dL — ABNORMAL HIGH (ref 65–99)
POTASSIUM: 3.9 mmol/L (ref 3.5–5.1)
SODIUM: 136 mmol/L (ref 135–145)
Sodium: 136 mmol/L — AB (ref 137–147)

## 2015-10-04 NOTE — Progress Notes (Signed)
Patient ID: Trevor Brock, male   DOB: 1932/12/11, 82 y.o.   MRN: [id number]  Provider:   Location:  [facility]   Place of Service:  SNF (31)  PCP: Marga Melnick, MD Patient Care Team: Pecola Lawless, MD as PCP - General (Internal Medicine)  Extended Emergency Contact Information Primary Emergency Contact: Debellis,Shirley Address: [phone]          [place] [address] [place] Home Phone: [phone] Mobile Phone: [phone] Relation: None Goals of Care: Advanced Directive information Advanced Directives 10/04/2015  Does patient have an advance directive? Yes  Type of Advance Directive Living will  Does patient want to make changes to advanced directive? No - Patient declined  Copy of advanced directive(s) in chart? Yes      Chief Complaint  Patient presents with  . New Admit To SNF  With history of syncope status post hospitalization   HPI: Patient is a 82 y.o. male seen today for admission toSkilled nursing after hospitalization for syncope.  Presented to the emergency department after up question postictal episode-apparently was found by his wife in the bathroom after a fall-she was vomiting and having jerking-like movements.  Patient initially had right-sided gaze and left-sided weakness with stroke was called.  He was brought to the hospital for evaluation and had a negative CT scan.  This is thought to be a syncopal episode of unclear etiology possible dehydration versus orthostatics.--Possibly seizure  Urinalysis and chest x-ray no acute abnormality.  EEG showed mild-to-moderate encephalopathic changes--and he has been started on Keppra.  Apparently mental status improved.  Patient did have significant weakness in his statins were discontinued with recommendation not to restart.  He also has a history of hypothyroidism is on Synthroid.  He had some hypokalemia in the hospital which was mild and replenished.  Patient also  has a history of pancytopenia apparently this was improving with no plan for further intervention at this time-platelets of 73,000 on lab done today show some improvement.  Hematoma is stable at 13.7 white count is 5.7.--I do note he is on iron  Currently patient has no complaints he is here essentially for strengthening vital signs appear to be stable systolic blood pressure is mildly elevated at 154 will await more readings before making any medication adjustments  Past Medical History  Diagnosis Date  . Hyperlipidemia   . Depression   . Mild memory disturbance   . Assistance needed for mobility     walks with walker  . Hypothyroid    Past Surgical History  Procedure Laterality Date  . Cholecystectomy    . Tonsillectomy    . Retinal detachment surgery Right     reports that he has never smoked. He does not have any smokeless tobacco history on file. He reports that he does not drink alcohol or use illicit drugs. Social History   Social History  . Marital Status: Married    Spouse Name: N/A  . Number of Children: N/A  . Years of Education: N/A   Occupational History  . Not on file.   Social History Main Topics  . Smoking status: Never Smoker   . Smokeless tobacco: Not on file  . Alcohol Use: No  . Drug Use: No  . Sexual Activity: Not on file   Other Topics Concern  . Not on file   Social History Narrative    Functional Status Survey-apparently his wife did all the financial management in the household secondary to  apparently some progressive dementia    Medications.  Ferrous sulfate 325 mg every morning.  Keppra 500 mg twice a day.  Synthroid 112 g daily.  Zoloft 50 mg daily.      History reviewed. No pertinent family history.  There are no preventive care reminders to display for this patient.  Allergies  Allergen Reactions  . Statins     Possible cause of weakness      Review of Systems  This is limited secondary to patient being a poor  historian provided by wife nursing staff.  In general no complaints of fever or chills.  Skin no rashes or itching noted.  Head ears eyes nose mouth and throat does not clear visual changes or sore throat.  Respiratory no shortness breath or cough.  Cardiac does not complaining of any chest pain or significant lower extremity edema.  GI no complaints of nausea vomiting abdominal pain constipation or diarrhea.  GU no complaints of dysuria.  Muscle skeletal has weakness but does not currently complain of joint pain did have significant leg weakness and apparently this is why statin was discontinued apparently there was concern for elevated liver function tests as well.  Neurologic does not complain of dizziness headache or syncopal-type feelings since hospital discharge.  Questionable history of seizures.  Psych again he appears to have some progressive dementia per speaking with nursing staff as well as with his wife he does not appear anxious or overtly depressed.    Filed Vitals:   10/04/15 0814  BP: 154/71  Pulse: 73  Temp: 97.7 F (36.5 C)  TempSrc: Oral  Resp: 20  Height: 6' (1.829 m)  SpO2: 91%   There is no weight on file to calculate BMI. Physical Exam   In general this is a frail elderly male in no distress sitting comfortably in his chair.  He does communicate but appears to be quite confused.  His skin is warm and dry he does have some chronic bruising most possible on his hands arms.  Eyes pupils appear reactive light sclerae are clear visual acuity appears grossly intact.  Oropharynx clear mucous membranes moist.  Chest is clear to auscultation there is no labored breathing.  Heart is regular rate and rhythm without murmur gallop or rub he does not appear to have significant lower extremity edema pedal pulses are intact bilaterally.  Abdomen is soft nontender with positive bowel sounds.  Muscle skeletal he does move all extremities 4 with what  appears to be lower extremity weakness apparently he does walk when he has some assistance appears to have a web type Toprol she takes between the second and third toes on his right foot.  Neurologic is grossly intact speech is clear although he does not speak much appears to have findings compatible with a dementia cranial nerves appear grossly intact.  Psyche is oriented to self only does follow simple verbal commands.    Labs reviewed: Basic Metabolic Panel:  Recent Labs  16/03/9603/05/17 0602 10/03/15 0601 10/04/15 0720  NA 139 137 136  K 3.9 3.4* 3.9  CL 102 101 102  CO2 25 23 25   GLUCOSE 85 101* 105*  BUN 5* 6 9  CREATININE 0.75 0.80 0.72  CALCIUM 8.6* 8.5* 8.4*   Liver Function Tests:  Recent Labs  09/28/15 1700  AST 51*  ALT 25  ALKPHOS 178*  BILITOT 0.8  PROT 6.4*  ALBUMIN 3.5   No results for input(s): LIPASE, AMYLASE in the last 8760 hours. No  results for input(s): AMMONIA in the last 8760 hours. CBC: Updated CBC done on 10/04/2015 shows a WBC of 5.7 hemoglobin 13.7 platelets of 73,000  Recent Labs  09/28/15 1700  10/01/15 0442 10/02/15 0602 10/03/15 0601  WBC 5.8  < > 3.8* 4.1 4.7  NEUTROABS 2.0  --   --   --  2.4  HGB 13.0  < > 12.5* 13.1 12.8*  HCT 39.7  < > 38.0* 39.8 38.0*  MCV 95.4  < > 95.0 94.1 93.6  PLT 67*  < > 50* 52* 59*  < > = values in this interval not displayed. Cardiac Enzymes:  Recent Labs  09/28/15 1701  CKTOTAL 57   BNP: Invalid input(s): POCBNP No results found for: HGBA1C No results found for: TSH No results found for: VITAMINB12 No results found for: FOLATE Lab Results  Component Value Date   FERRITIN 15* 07/30/2009    Imaging and Procedures obtained prior to SNF admission: No results found.  Assessment/Plan  #1-syncope-again discussed with radiology whether dehydration orthostatic-apparently is been no reoccurrence-urine and chest x-ray were negative in the hospital.  Questionable possible seizure neurology was  consulted-.  Mental status apparently is now essentially at baseline-at this point will monitor he will need PT and OT-this may be complicated somewhat with what appears to be fairly progressive dementia.  o dementia-as noted above he will need supportive care apparently his wife is been quite involved with managing this at home.  #3 hyperlipidemia stents been discontinued secondary concerns about weakness-liver function tests showed mildly elevated alkaline phosphatase we will update this next week clinically apparently he is getting somewhat stronger.  #4-history hypothyroidism he does continue on Synthroid--he will need a TSH checked.  #5-hypokalemia this was supplemented the hospital thought to be mild this appears to have normalized with a potassium of 3.9 a most recent lab will update this next week as well.  #6 pancytopenia-apparently this mainly involves his platelets which appear to be increasing somewhat will recheck this next week as well.  #7 history of anemia he is on iron hemoglobin appears to be stable at 13.7 this will be updated next week as well.  #8 depression he is on Zoloft at this point appears to be stable I suspect coexistent dementia as well  [id number]-of note greater than 40 minutes spent assessing patient-reviewing his chart-discussing his status with staff-as well as with his wife at bedside-and coordinating and formulating a plan of care for numerous diagnoses-of note greater than 50% of time spent coordinating plan of care

## 2015-10-06 NOTE — Progress Notes (Signed)
Patient ID: Trevor Brock, male   DOB: [birth year], 82 y.o.   MRN: [id number]

## 2015-10-07 ENCOUNTER — Encounter: Payer: Self-pay | Admitting: Internal Medicine

## 2015-10-07 ENCOUNTER — Encounter (HOSPITAL_COMMUNITY)
Admission: RE | Admit: 2015-10-07 | Discharge: 2015-10-07 | Disposition: A | Discharge status: Home / Self Care | Payer: Medicare Other | Source: Skilled Nursing Facility | Attending: Internal Medicine | Admitting: Internal Medicine

## 2015-10-07 DIAGNOSIS — D61818 Other pancytopenia: Secondary | ICD-10-CM | POA: Insufficient documentation

## 2015-10-07 LAB — COMPREHENSIVE METABOLIC PANEL
ALBUMIN: 3.2 g/dL — AB (ref 3.5–5.0)
ALT: 27 U/L (ref 17–63)
AST: 45 U/L — AB (ref 15–41)
Alkaline Phosphatase: 183 U/L — ABNORMAL HIGH (ref 38–126)
Anion gap: 7 (ref 5–15)
BUN: 10 mg/dL (ref 6–20)
CHLORIDE: 104 mmol/L (ref 101–111)
CO2: 26 mmol/L (ref 22–32)
CREATININE: 0.7 mg/dL (ref 0.61–1.24)
Calcium: 8.9 mg/dL (ref 8.9–10.3)
GFR calc Af Amer: >60 mL/min (ref >60)
GFR calc non Af Amer: >60 mL/min (ref >60)
GLUCOSE: 99 mg/dL (ref 65–99)
POTASSIUM: 4 mmol/L (ref 3.5–5.1)
Sodium: 137 mmol/L (ref 135–145)
Total Bilirubin: 0.9 mg/dL (ref 0.3–1.2)
Total Protein: 6 g/dL — ABNORMAL LOW (ref 6.5–8.1)

## 2015-10-07 LAB — CBC WITH DIFFERENTIAL/PLATELET
BASOS ABS: 0 10*3/uL (ref 0.0–0.1)
BASOS PCT: 1 %
EOS PCT: 10 %
Eosinophils Absolute: 0.4 10*3/uL (ref 0.0–0.7)
HCT: 40.4 % (ref 39.0–52.0)
Hemoglobin: 13.3 g/dL (ref 13.0–17.0)
LYMPHS PCT: 36 %
Lymphs Abs: 1.6 10*3/uL (ref 0.7–4.0)
MCH: 31.1 pg (ref 26.0–34.0)
MCHC: 32.9 g/dL (ref 30.0–36.0)
MCV: 94.6 fL (ref 78.0–100.0)
MONO ABS: 0.4 10*3/uL (ref 0.1–1.0)
Monocytes Relative: 8 %
NEUTROS ABS: 2 10*3/uL (ref 1.7–7.7)
Neutrophils Relative %: 45 %
PLATELETS: 70 10*3/uL — AB (ref 150–400)
RBC: 4.27 MIL/uL (ref 4.22–5.81)
RDW: 15.7 % — AB (ref 11.5–15.5)
WBC: 4.5 10*3/uL (ref 4.0–10.5)

## 2015-10-07 LAB — TSH: TSH: 2.401 u[IU]/mL (ref 0.350–4.500)

## 2015-10-08 ENCOUNTER — Encounter: Payer: Self-pay | Admitting: Internal Medicine

## 2015-10-08 ENCOUNTER — Non-Acute Institutional Stay (SKILLED_NURSING_FACILITY): Payer: Medicare Other | Admitting: Internal Medicine

## 2015-10-08 DIAGNOSIS — F0390 Unspecified dementia without behavioral disturbance: Secondary | ICD-10-CM

## 2015-10-08 DIAGNOSIS — D61818 Other pancytopenia: Secondary | ICD-10-CM

## 2015-10-08 DIAGNOSIS — R55 Syncope and collapse: Secondary | ICD-10-CM | POA: Diagnosis not present

## 2015-10-08 DIAGNOSIS — E038 Other specified hypothyroidism: Secondary | ICD-10-CM | POA: Diagnosis not present

## 2015-10-08 NOTE — Progress Notes (Signed)
Patient ID: Trevor Brock, male   DOB: 07-23-1932, 82 y.o.   MRN: [id number]     This is a comprehensive admission note to [facility] personally performed by Marga MelnickWilliam Urbano Milhouse MD on this date less than 30 days from date of admission. Included are preadmission medical/surgical history;reconciled medication list; family history; social history and comprehensive review of systems.  Corrections and additions to the records were documented . Comprehensive physical exam was also performed. Additionally a clinical summary was entered for each active diagnosis pertinent to this admission in the Problem List to enhance continuity of care.  PCP: Dr. Shelva MajesticZachary Hall  HPI: 82 year old male with baseline dementia who had an unobserved syncopal event. His wife heard him fall in the bathroom. He was found vomiting and having "jerking like movements in the lower extremities".  EMS documented right-sided gaze and left-sided weakness. A code stroke was called. CT scan in the ER revealed no acute process other than hematoma over the right frontal area.  MRI revealed mild chronic small vessel ischemic disease and old small right cerebellar infarcts. EEG suggested mild to moderate encephalopathic changes. Keppra initiated as per neurology consultant.   Past medical and surgical history: There is a history of hyperlipidemia with statin intolerance manifested as lower extremity weakness.  He is on thyroid supplementation for nonspecific hypothyroidism.  He has had persistent thrombocytopenia; is also exhibited anemia as well as leukopenia. There is been no sustained pancytopenia however.  Social history: never smoked; nondrinker  Family history: No history recorded; this was updated after review with his wife  Comprehensive review of systems: According to his wife he had been a blood donor up until his 82th birthday. She states that remotely he had epistaxis but no other bleeding dyscrasias. As of  February he's had recurrent falls and exertional dyspnea. It was for this reason the statin was discontinued Constitutional: No fever,significant weight change Eyes: No redness, discharge, pain, vision change ENT/mouth: No nasal congestion,  purulent discharge, earache,change in hearing ,sore throat  Cardiovascular: No chest pain, palpitations,paroxysmal nocturnal dyspnea, claudication, edema  Respiratory: No cough, sputum production,hemoptysis, significant snoring,apnea   Gastrointestinal: No heartburn,dysphagia,abdominal pain, nausea / vomiting,rectal bleeding, melena,change in bowels Genitourinary: No dysuria,hematuria, pyuria,  incontinence, nocturia Musculoskeletal: No joint stiffness, joint swelling, pain Dermatologic: No rash, pruritus, change in appearance of skin Neurologic: No dizziness,headache, numbness , tingling Psychiatric: No significant anxiety , depression, insomnia, anorexia Endocrine: No change in hair/skin/ nails, excessive thirst, excessive hunger, excessive urination  Hematologic/lymphatic: No significant bruising, lymphadenopathy,abnormal bleeding Allergy/immunology: No itchy/ watery eyes, significant sneezing, urticaria, angioedema  Physical exam:  Pertinent or positive findings: The patient just returned from physical therapy and is dyspneic. Pattern alopecia is present He is unable to give me the date; he answers "the date is today". He has a constant tremor of the mandible. He has a few remaining teeth, especially of the maxilla. Dental repair is fair-poor. Ventral hernia is present. This is not reducible. She'll tibial pulses are decreased. He has trace edema of the right leg. Scattered ecchymoses mainly over the right neck and forearms is present. To lesser extent ecchymosis is present over the lower extremities as well.  General appearance:Adequately nourished; no acute distress , increased work of breathing is present.   Lymphatic: No lymphadenopathy about the  head, neck, axilla . Eyes: No conjunctival inflammation or lid edema is present. There is no scleral icterus. Ears:  External ear exam shows no significant lesions or deformities.   Nose:  External  nasal examination shows no deformity or inflammation. Nasal mucosa are pink and moist without lesions ,exudates Oral exam: lips and gums are healthy appearing.There is no oropharyngeal erythema or exudate . Neck:  No thyromegaly, masses, tenderness noted.    Heart:  Normal rate and regular rhythm. S1 and S2 normal without gallop, murmur, click, rub .  Lungs:Chest clear to auscultation without wheezes, rhonchi,rales , rubs. Abdomen:Bowel sounds are normal. Abdomen is soft and nontender with no organomegaly or masses. GU: deferred as previously addressed. Extremities:  No cyanosis, clubbing  Neurologic exam : Strength equal but decreased in upper & lower extremities Balance,Rhomberg,finger to nose testing could not be completed due to clinical state Deep tendon reflexes are equal Skin: Warm & dry w/o tenting. No significant lesions or rash.  See clinical summary under each active problem in the Problem List with associated updated therapeutic plan

## 2015-10-08 NOTE — Assessment & Plan Note (Signed)
10/07/15 platelet count essentially stable at 70,000 White count and hemoglobin normal Avoid excess aspirin , ibuprofen, naproxen etc .Repeat platelet count if  any abnormal bruising or bleeding occur.

## 2015-10-08 NOTE — Assessment & Plan Note (Signed)
Monitor for recurrence on Keppra

## 2015-10-08 NOTE — Assessment & Plan Note (Signed)
TSH therapeutic 10/07/15; no change in thyroid dose

## 2015-10-16 ENCOUNTER — Encounter: Payer: Self-pay | Admitting: Internal Medicine

## 2015-10-16 ENCOUNTER — Inpatient Hospital Stay (HOSPITAL_COMMUNITY)
Admission: EM | Admit: 2015-10-16 | Discharge: 2015-10-20 | DRG: 871 | Disposition: A | Discharge status: Skilled Nursing Facility | Payer: Medicare Other | Attending: Internal Medicine | Admitting: Internal Medicine

## 2015-10-16 ENCOUNTER — Non-Acute Institutional Stay (SKILLED_NURSING_FACILITY): Payer: Medicare Other | Admitting: Internal Medicine

## 2015-10-16 ENCOUNTER — Emergency Department (HOSPITAL_COMMUNITY): Payer: Medicare Other

## 2015-10-16 ENCOUNTER — Ambulatory Visit (HOSPITAL_COMMUNITY)
Admit: 2015-10-16 | Discharge: 2015-10-16 | Disposition: A | Discharge status: Home / Self Care | Payer: Medicare Other | Attending: Internal Medicine | Admitting: Internal Medicine

## 2015-10-16 ENCOUNTER — Encounter (HOSPITAL_COMMUNITY): Payer: Self-pay

## 2015-10-16 ENCOUNTER — Other Ambulatory Visit (HOSPITAL_COMMUNITY)
Admission: AD | Admit: 2015-10-16 | Discharge: 2015-10-16 | Disposition: A | Discharge status: Home / Self Care | Payer: Medicare Other | Source: Skilled Nursing Facility | Attending: Internal Medicine | Admitting: Internal Medicine

## 2015-10-16 DIAGNOSIS — E038 Other specified hypothyroidism: Secondary | ICD-10-CM | POA: Diagnosis not present

## 2015-10-16 DIAGNOSIS — A419 Sepsis, unspecified organism: Principal | ICD-10-CM | POA: Diagnosis present

## 2015-10-16 DIAGNOSIS — E872 Acidosis: Secondary | ICD-10-CM | POA: Diagnosis present

## 2015-10-16 DIAGNOSIS — R41841 Cognitive communication deficit: Secondary | ICD-10-CM | POA: Diagnosis present

## 2015-10-16 DIAGNOSIS — E039 Hypothyroidism, unspecified: Secondary | ICD-10-CM | POA: Diagnosis not present

## 2015-10-16 DIAGNOSIS — R262 Difficulty in walking, not elsewhere classified: Secondary | ICD-10-CM | POA: Insufficient documentation

## 2015-10-16 DIAGNOSIS — Z6827 Body mass index (BMI) 27.0-27.9, adult: Secondary | ICD-10-CM

## 2015-10-16 DIAGNOSIS — R278 Other lack of coordination: Secondary | ICD-10-CM | POA: Diagnosis not present

## 2015-10-16 DIAGNOSIS — E440 Moderate protein-calorie malnutrition: Secondary | ICD-10-CM | POA: Diagnosis present

## 2015-10-16 DIAGNOSIS — F32A Depression, unspecified: Secondary | ICD-10-CM | POA: Diagnosis present

## 2015-10-16 DIAGNOSIS — G4089 Other seizures: Secondary | ICD-10-CM | POA: Diagnosis present

## 2015-10-16 DIAGNOSIS — S0003XA Contusion of scalp, initial encounter: Secondary | ICD-10-CM

## 2015-10-16 DIAGNOSIS — D696 Thrombocytopenia, unspecified: Secondary | ICD-10-CM | POA: Diagnosis not present

## 2015-10-16 DIAGNOSIS — M6281 Muscle weakness (generalized): Secondary | ICD-10-CM | POA: Insufficient documentation

## 2015-10-16 DIAGNOSIS — L0231 Cutaneous abscess of buttock: Secondary | ICD-10-CM | POA: Diagnosis present

## 2015-10-16 DIAGNOSIS — H5702 Anisocoria: Secondary | ICD-10-CM | POA: Insufficient documentation

## 2015-10-16 DIAGNOSIS — W06XXXA Fall from bed, initial encounter: Secondary | ICD-10-CM

## 2015-10-16 DIAGNOSIS — R4182 Altered mental status, unspecified: Secondary | ICD-10-CM | POA: Diagnosis not present

## 2015-10-16 DIAGNOSIS — I10 Essential (primary) hypertension: Secondary | ICD-10-CM | POA: Insufficient documentation

## 2015-10-16 DIAGNOSIS — Z9181 History of falling: Secondary | ICD-10-CM | POA: Insufficient documentation

## 2015-10-16 DIAGNOSIS — F0390 Unspecified dementia without behavioral disturbance: Secondary | ICD-10-CM | POA: Diagnosis present

## 2015-10-16 DIAGNOSIS — R296 Repeated falls: Secondary | ICD-10-CM | POA: Diagnosis present

## 2015-10-16 DIAGNOSIS — F329 Major depressive disorder, single episode, unspecified: Secondary | ICD-10-CM | POA: Diagnosis present

## 2015-10-16 DIAGNOSIS — E785 Hyperlipidemia, unspecified: Secondary | ICD-10-CM | POA: Diagnosis present

## 2015-10-16 DIAGNOSIS — L03317 Cellulitis of buttock: Secondary | ICD-10-CM | POA: Diagnosis not present

## 2015-10-16 DIAGNOSIS — R413 Other amnesia: Secondary | ICD-10-CM | POA: Diagnosis not present

## 2015-10-16 DIAGNOSIS — R55 Syncope and collapse: Secondary | ICD-10-CM | POA: Insufficient documentation

## 2015-10-16 DIAGNOSIS — D61818 Other pancytopenia: Secondary | ICD-10-CM

## 2015-10-16 DIAGNOSIS — R17 Unspecified jaundice: Secondary | ICD-10-CM

## 2015-10-16 DIAGNOSIS — G9341 Metabolic encephalopathy: Secondary | ICD-10-CM | POA: Diagnosis not present

## 2015-10-16 DIAGNOSIS — L0390 Cellulitis, unspecified: Secondary | ICD-10-CM | POA: Diagnosis not present

## 2015-10-16 DIAGNOSIS — R627 Adult failure to thrive: Secondary | ICD-10-CM | POA: Diagnosis present

## 2015-10-16 DIAGNOSIS — S0990XA Unspecified injury of head, initial encounter: Secondary | ICD-10-CM | POA: Diagnosis not present

## 2015-10-16 DIAGNOSIS — L0291 Cutaneous abscess, unspecified: Secondary | ICD-10-CM

## 2015-10-16 DIAGNOSIS — Z01812 Encounter for preprocedural laboratory examination: Secondary | ICD-10-CM | POA: Diagnosis not present

## 2015-10-16 LAB — COMPREHENSIVE METABOLIC PANEL
ALT: 33 U/L (ref 17–63)
ANION GAP: 10 (ref 5–15)
AST: 51 U/L — ABNORMAL HIGH (ref 15–41)
Albumin: 4 g/dL (ref 3.5–5.0)
Alkaline Phosphatase: 216 U/L — ABNORMAL HIGH (ref 38–126)
BUN: 11 mg/dL (ref 6–20)
CHLORIDE: 103 mmol/L (ref 101–111)
CO2: 22 mmol/L (ref 22–32)
CREATININE: 0.83 mg/dL (ref 0.61–1.24)
Calcium: 9.4 mg/dL (ref 8.9–10.3)
GFR calc non Af Amer: >60 mL/min (ref >60)
Glucose, Bld: 107 mg/dL — ABNORMAL HIGH (ref 65–99)
POTASSIUM: 3.9 mmol/L (ref 3.5–5.1)
SODIUM: 135 mmol/L (ref 135–145)
Total Bilirubin: 2 mg/dL — ABNORMAL HIGH (ref 0.3–1.2)
Total Protein: 7.4 g/dL (ref 6.5–8.1)

## 2015-10-16 LAB — CBC WITH DIFFERENTIAL/PLATELET
Basophils Absolute: 0 10*3/uL (ref 0.0–0.1)
Basophils Absolute: 0.1 10*3/uL (ref 0.0–0.1)
Basophils Relative: 0 %
Basophils Relative: 1 %
EOS ABS: 0.2 10*3/uL (ref 0.0–0.7)
EOS PCT: 2 %
Eosinophils Absolute: 0.1 10*3/uL (ref 0.0–0.7)
Eosinophils Relative: 1 %
HCT: 47.6 % (ref 39.0–52.0)
HEMATOCRIT: 43.7 % (ref 39.0–52.0)
Hemoglobin: 15.5 g/dL (ref 13.0–17.0)
Hemoglobin: 14.1 g/dL (ref 13.0–17.0)
LYMPHS ABS: 2 10*3/uL (ref 0.7–4.0)
LYMPHS ABS: 1.6 10*3/uL (ref 0.7–4.0)
LYMPHS PCT: 16 %
Lymphocytes Relative: 22 %
MCH: 31.1 pg (ref 26.0–34.0)
MCH: 30.7 pg (ref 26.0–34.0)
MCHC: 32.6 g/dL (ref 30.0–36.0)
MCHC: 32.3 g/dL (ref 30.0–36.0)
MCV: 95.4 fL (ref 78.0–100.0)
MCV: 95 fL (ref 78.0–100.0)
MONOS PCT: 8 %
Monocytes Absolute: 0.7 10*3/uL (ref 0.1–1.0)
Monocytes Absolute: 0.9 10*3/uL (ref 0.1–1.0)
Monocytes Relative: 8 %
NEUTROS ABS: 7.8 10*3/uL — AB (ref 1.7–7.7)
NEUTROS PCT: 74 %
Neutro Abs: 6.1 10*3/uL (ref 1.7–7.7)
Neutrophils Relative %: 68 %
PLATELETS: 70 10*3/uL — AB (ref 150–400)
Platelets: 68 10*3/uL — ABNORMAL LOW (ref 150–400)
RBC: 4.99 MIL/uL (ref 4.22–5.81)
RBC: 4.6 MIL/uL (ref 4.22–5.81)
RDW: 15.5 % (ref 11.5–15.5)
RDW: 15.4 % (ref 11.5–15.5)
SMEAR REVIEW: DECREASED
WBC: 9.1 10*3/uL (ref 4.0–10.5)
WBC: 10.4 10*3/uL (ref 4.0–10.5)

## 2015-10-16 LAB — BASIC METABOLIC PANEL
Anion gap: 12 (ref 5–15)
BUN: 11 mg/dL (ref 6–20)
CHLORIDE: 104 mmol/L (ref 101–111)
CO2: 22 mmol/L (ref 22–32)
CREATININE: 0.82 mg/dL (ref 0.61–1.24)
Calcium: 9.7 mg/dL (ref 8.9–10.3)
GFR calc Af Amer: >60 mL/min (ref >60)
GFR calc non Af Amer: >60 mL/min (ref >60)
Glucose, Bld: 102 mg/dL — ABNORMAL HIGH (ref 65–99)
Potassium: 4.5 mmol/L (ref 3.5–5.1)
SODIUM: 138 mmol/L (ref 135–145)

## 2015-10-16 LAB — URINALYSIS, ROUTINE W REFLEX MICROSCOPIC
Bilirubin Urine: NEGATIVE
GLUCOSE, UA: NEGATIVE mg/dL
LEUKOCYTES UA: NEGATIVE
Nitrite: NEGATIVE
Protein, ur: NEGATIVE mg/dL
SPECIFIC GRAVITY, URINE: 1.015 (ref 1.005–1.030)
pH: 7.5 (ref 5.0–8.0)

## 2015-10-16 LAB — I-STAT CG4 LACTIC ACID, ED: Lactic Acid, Venous: 2.84 mmol/L (ref 0.5–2.0)

## 2015-10-16 LAB — URINE MICROSCOPIC-ADD ON

## 2015-10-16 MED ORDER — LIDOCAINE-EPINEPHRINE (PF) 2 %-1:200000 IJ SOLN
INTRAMUSCULAR | Status: AC
  Filled 2015-10-16: qty 20

## 2015-10-16 MED ORDER — ACETAMINOPHEN 650 MG RE SUPP
RECTAL | Status: AC
  Filled 2015-10-16: qty 1

## 2015-10-16 MED ORDER — PIPERACILLIN-TAZOBACTAM 3.375 G IVPB
3.3750 g | Freq: Once | INTRAVENOUS | Status: AC
  Administered 2015-10-16: 3.375 g via INTRAVENOUS
  Filled 2015-10-16: qty 50

## 2015-10-16 MED ORDER — ACETAMINOPHEN 650 MG RE SUPP
650.0000 mg | Freq: Once | RECTAL | Status: AC
  Administered 2015-10-16: 650 mg via RECTAL

## 2015-10-16 MED ORDER — VANCOMYCIN HCL IN DEXTROSE 1-5 GM/200ML-% IV SOLN
1000.0000 mg | Freq: Once | INTRAVENOUS | Status: AC
  Administered 2015-10-17: 1000 mg via INTRAVENOUS
  Filled 2015-10-16: qty 200

## 2015-10-16 MED ORDER — IOPAMIDOL (ISOVUE-300) INJECTION 61%
75.0000 mL | Freq: Once | INTRAVENOUS | Status: AC | PRN
  Administered 2015-10-16: 75 mL via INTRAVENOUS

## 2015-10-16 MED ORDER — SODIUM CHLORIDE 0.9 % IV BOLUS (SEPSIS)
3000.0000 mL | Freq: Once | INTRAVENOUS | Status: AC
  Administered 2015-10-16: 3000 mL via INTRAVENOUS

## 2015-10-16 NOTE — ED Provider Notes (Signed)
CSN: [id number]     Arrival date & time 10/16/15  2017 History   By signing my name below, I, Linus Galas, attest that this documentation has been prepared under the direction and in the presence of Mancel Bale, MD. Electronically Signed: Linus Galas, ED Scribe. 10/16/2015. 9:29 PM.   Chief Complaint  Patient presents with  . Altered Mental Status   Level 5 Caveat for an altered mental status  The history is provided by the nursing home. The history is limited by the condition of the patient. No language interpreter was used.   HPI Comments: Trevor Brock is a 82 y.o. male who presents to the Emergency Department from [facility] for an evaluation of an altered mental status noted today. As per NH staff, pts family had visited the pt today and noted that he was "more confused" than his baseline and wanted the pt to be evaluated.   Past Medical History  Diagnosis Date  . Hyperlipidemia     Statin intolerance as leg weakness  . Depression   . Mild memory disturbance   . Assistance needed for mobility     walks with walker  . Hypothyroid    Past Surgical History  Procedure Laterality Date  . Cholecystectomy    . Tonsillectomy    . Retinal detachment surgery Right    Family History  Problem Relation Age of Onset  . Cancer Mother     Diffuse metastases of unknown primary  . Cancer Sister     Breast  . Cancer Brother     Gastrointestinal  . Stroke Neg Hx   . Diabetes Neg Hx   . Heart disease Neg Hx    Social History  Substance Use Topics  . Smoking status: Never Smoker   . Smokeless tobacco: None  . Alcohol Use: No    Review of Systems  Unable to perform ROS: Mental status change   Allergies  Statins  Home Medications   Prior to Admission medications   Medication Sig Start Date End Date Taking? Authorizing Provider  ferrous sulfate 325 (65 FE) MG tablet Take 325 mg by mouth every morning.    Historical Provider, MD  levETIRAcetam (KEPPRA) 500  MG tablet Take 1 tablet (500 mg total) by mouth 2 (two) times daily. 10/03/15   Dorothea Ogle, MD  levothyroxine (SYNTHROID, LEVOTHROID) 112 MCG tablet Take one tablet by mouth 30 minutes before breakfast once daily for thyroid 09/15/15   Historical Provider, MD  sertraline (ZOLOFT) 50 MG tablet Take 1 tablet (50 mg total) by mouth daily. 10/03/15   Dorothea Ogle, MD  UNABLE TO FIND Med Name: Magic Cup twice daily    Historical Provider, MD  zinc oxide (BALMEX) 11.3 % CREA cream Apply every shift and as needed    Historical Provider, MD   BP 132/65 mmHg  Pulse 92  Temp(Src) 97.7 F (36.5 C) (Oral)  Resp 20  SpO2 100%   Physical Exam  Constitutional: He appears well-developed and well-nourished.  HENT:  Head: Normocephalic and atraumatic.  Right Ear: External ear normal.  Left Ear: External ear normal.  Bruise to the left parietal region  Eyes: Conjunctivae and EOM are normal. Pupils are equal, round, and reactive to light.  Neck: Normal range of motion and phonation normal. Neck supple.  Cardiovascular: Normal rate, regular rhythm and normal heart sounds.   Pulmonary/Chest: Effort normal and breath sounds normal. He exhibits no bony tenderness.  Abdominal: Soft. There is no  tenderness.  Reducible upper abdomen wall hernia  Musculoskeletal: Normal range of motion.  3.5 cm fluctuance mass to the left buttock draining purulent material  Neurological: He is alert. No cranial nerve deficit or sensory deficit. He exhibits normal muscle tone. Coordination normal.  Skin: Skin is warm, dry and intact.  Psychiatric: His behavior is normal.  Nursing note and vitals reviewed.   ED Course  Procedures   INCISION AND DRAINAGE PROCEDURE NOTE: Patient identification was confirmed. Unable to give consent due to altered mental status. This procedure was performed by Mancel BaleElliott Keylan Costabile, MD at 9:20 PM. Site: left lateral buttock Sterile procedures observed Needle size: 25 Anesthetic used (type and amt):  5 cc of 2% xylocaine with epi Blade size: 11 Drainage: purulent Complexity: Complex 15 inches of .25 inch packing used.  Site anesthetized, incision made over site, wound drained and explored loculations, rinsed with copious amounts of normal saline, wound packed with sterile gauze, covered with dry, sterile dressing.  Pt tolerated procedure well without complications.  Instructions for care discussed verbally and pt provided with additional written instructions for homecare and f/u.  DIAGNOSTIC STUDIES: Oxygen Saturation is 100% on room air, normal by my interpretation.    COORDINATION OF CARE: 9:21 PM Will give fluids and Tylenol. Will order urinalysis, CXR, and blood work.   Medications  lidocaine-EPINEPHrine (XYLOCAINE W/EPI) 2 %-1:200000 (PF) injection (not administered)  acetaminophen (TYLENOL) 650 MG suppository (not administered)  piperacillin-tazobactam (ZOSYN) IVPB 3.375 g (not administered)  acetaminophen (TYLENOL) suppository 650 mg (650 mg Rectal Given 10/16/15 2125)  sodium chloride 0.9 % bolus 3,000 mL (3,000 mLs Intravenous New Bag/Given 10/16/15 2251)    Patient Vitals for the past 24 hrs:  BP Temp Temp src Pulse Resp SpO2  10/16/15 2330 (!) 124/53 mmHg - - 89 - 98 %  10/16/15 2200 137/65 mmHg - - 92 18 99 %  10/16/15 2019 132/65 mmHg 97.7 F (36.5 C) Oral 92 20 100 %    11:26 PM Reevaluation with update and discussion. After initial assessment and treatment, an updated evaluation reveals patient's son updated on the findings.Mancel Bale. Casimiro Lienhard L    11:40 PM-Consult complete with Hospitalist. Patient case explained and discussed. He agrees to admit patient for further evaluation and treatment. Call ended at [phone]   CRITICAL CARE Performed by: Flint MelterWENTZ,Taylr Meuth L Total critical care time: 45 minutes Critical care time was exclusive of separately billable procedures and treating other patients. Critical care was necessary to treat or prevent imminent or  life-threatening deterioration. Critical care was time spent personally by me on the following activities: development of treatment plan with patient and/or surrogate as well as nursing, discussions with consultants, evaluation of patient's response to treatment, examination of patient, obtaining history from patient or surrogate, ordering and performing treatments and interventions, ordering and review of laboratory studies, ordering and review of radiographic studies, pulse oximetry and re-evaluation of patient's condition.  Labs Review Labs Reviewed  COMPREHENSIVE METABOLIC PANEL - Abnormal; Notable for the following:    Glucose, Bld 107 (*)    AST 51 (*)    Alkaline Phosphatase 216 (*)    Total Bilirubin 2.0 (*)    All other components within normal limits  CBC WITH DIFFERENTIAL/PLATELET - Abnormal; Notable for the following:    Platelets 68 (*)    Neutro Abs 7.8 (*)    All other components within normal limits  CULTURE, BLOOD (ROUTINE X 2)  CULTURE, BLOOD (ROUTINE X 2)  URINE CULTURE  URINALYSIS, ROUTINE W REFLEX  MICROSCOPIC (NOT AT [facility])  I-STAT CG4 LACTIC ACID, ED    Imaging Review Ct Head Wo Contrast  10/16/2015  CLINICAL DATA:  Altered mental status today, with increasing confusion since the prior CT scan from earlier today. EXAM: CT HEAD WITHOUT CONTRAST TECHNIQUE: Contiguous axial images were obtained from the base of the skull through the vertex without intravenous contrast. COMPARISON:  10/16/2015 at 11:18 a.m. FINDINGS: The brainstem, cerebellum, cerebral peduncles, thalami, basal ganglia, basilar cisterns, and ventricular system appear within normal limits. Periventricular white matter and corona radiata hypodensities favor chronic ischemic microvascular white matter disease. No intracranial hemorrhage, mass lesion, or acute CVA. Mild scalp hematoma above the right ear. No underlying calvarial fracture. Mild chronic ethmoid sinusitis. IMPRESSION: 1. Right scalp hematoma,  stable.  No acute intracranial findings. 2. Periventricular white matter and corona radiata hypodensities favor chronic ischemic microvascular white matter disease. 3. Mild chronic ethmoid sinusitis. Electronically Signed   By: Gaylyn Rong M.D.   On: 10/16/2015 21:36   Ct Head W Wo Contrast  10/16/2015  CLINICAL DATA:  Larey Seat from bed today.  Scalp hematoma.  Anisocoria. EXAM: CT HEAD WITHOUT AND WITH CONTRAST TECHNIQUE: Contiguous axial images were obtained from the base of the skull through the vertex without and with intravenous contrast CONTRAST:  75mL ISOVUE-300 IOPAMIDOL (ISOVUE-300) INJECTION 61% COMPARISON:  MRI 09/29/2015 FINDINGS: Mild atrophy.  Negative for hydrocephalus. Negative for acute infarct. Mild chronic microvascular ischemic change in the white matter. Negative for hemorrhage or mass. Postcontrast imaging demonstrates normal enhancement. No enhancing mass lesion. Vascular enhancement normal. Right parietal scalp hematoma. Negative for skull fracture. Paranasal sinuses clear. IMPRESSION: Atrophy and mild chronic microvascular ischemic change. No acute intracranial abnormality. Right parietal scalp hematoma. Electronically Signed   By: Marlan Palau M.D.   On: 10/16/2015 11:52   I have personally reviewed and evaluated these images and lab results as part of my medical decision-making.   EKG Interpretation None      MDM   Final diagnoses:  Sepsis, due to unspecified organism (HCC)  Abscess    Acute mental status with high fever, and elevated lactate. Source of infection appears to be right buttocks, abscess. Patient improved with initial treatment, and was given antibiotics to cover skin and soft tissue infection. Urinalysis, and chest x-ray are reassuring. Initial vital signs trending is normal. He'll require admission for further treatment.   Nursing Notes Reviewed/ Care Coordinated, and agree without changes. Applicable Imaging Reviewed.  Interpretation of  Laboratory Data incorporated into ED treatment  Plan: Admit  I personally performed the services described in this documentation, which was scribed in my presence. The recorded information has been reviewed and is accurate.    Mancel Bale, MD 10/16/15 [phone]

## 2015-10-16 NOTE — ED Notes (Signed)
Pt sent [facility] for further evaluation of altered mental status, staff report that pt had outpatient ct performed earlier today that was negative, upon [facility] family was concerned that pt seemed "more confused", pt will speak with spoken to, confused,

## 2015-10-16 NOTE — Progress Notes (Signed)
Patient ID: Trevor Brock, male   DOB: Nov 19, 1932, 82 y.o.   MRN: [id number]    This is a nursing facility follow up for specific acute issue of  fall with temporal hematoma.  Interim medical record and care since last [facility] visit was updated with review of diagnostic studies and change in clinical status since last visit were documented.  He was admitted to [facility] after hospital stay 4/1-10/03/15 for syncope .CT imaging and MRI revealed small vessel ischemic changes with a right cerebellar infarct. EEG suggested encephalopathic changes. Because of possible seizure activity when found at the time syncope ; Keppra 500 mg twice a day was initiated. During the hospitalization he was found to have persistent thrombocytopenia with platelet counts ranging from 50000-67000 ;he is not on blood thinners. Most recent platelet count was 70,000 on 4/10. He also exhibited remittent mild anemia and leukopenia ; these were not persistent . Creatinine has been normal He was on Namenda at one time for dementia but this was discontinued by his PCP.   Note:  family and social history not pertinent to this assessment .  HPI: The patient was found approximately 8:15 am, sitting beside his bed. He was placed in the wheelchair; but he climbed back into bed on his own unobserved. At that time ecchymosis was noted of the right temple. The patient does not remember the fall and can give no meaningful history due to dementia.  Comprehensive review of systems: Specifically we can elicit no history of any cardiac or neurologic prodrome prior to the fall  Physical exam:  Pertinent or positive findings: He is lying in bed and is verbally interactive but obviously disoriented. Pattern baldness is present. Anisocoria is present, left pupil larger than the right. He has extremely poor dentition with caries and plaque formation. Grade one half systolic murmur is present at the base. Heart sounds are somewhat distant.  A ventral hernia versus lipoma is present at a well-healed operative scar . He has classic rheumatoid arthritis changes of the hands with lateral deviation. There is extension of the DIP joints of the thumbs. He has flexion changes in the toes. There is webbing of the second and third toes bilaterally. Nails are pitted and irregular on the left except for the fifth fingernail. He is disoriented except to person. Positive Babinski is present in the lower extremities. There is an irregular 4 x 2.5 cm ecchymosis over the right temple with a hematoma extending 5 x 5.5 cm There is faint erythema 4 x 4 centimeters over the left medial buttock. Centrally there is a 15 x 14 mm marked erythematous almost violaceous lesion with a suggestion of a central papule or pustule. There is no skin ulceration. General appearance:Adequately nourished; no acute distress , increased work of breathing is present.   Lymphatic: No lymphadenopathy about the head, neck, axilla . Eyes: No conjunctival inflammation or lid edema is present. There is no scleral icterus. Ears:  External ear exam shows no significant lesions or deformities.   Nose:  External nasal examination shows no deformity or inflammation. Nasal mucosa are pink and moist without lesions ,exudates Oral exam: lips and gums are healthy appearing.There is no oropharyngeal erythema or exudate . Neck:  No thyromegaly, masses, tenderness noted.    Heart:  Normal rate and regular rhythm. S1 and S2 normal without gallop,  click, rub .  Lungs:Chest clear to auscultation without wheezes, rhonchi,rales , rubs. Abdomen:Bowel sounds are normal. Abdomen is soft and nontender with  no organomegaly. GU: deferred . Extremities:  No cyanosis, clubbing,edema  Neurologic exam : Strength equal but decreased in upper & lower extremities Balance,Rhomberg,finger to nose testing could not be completed due to clinical state Deep tendon reflexes are equal and 1.5 + Skin: Warm & dry w/o  tenting.  Assessment:#1 unobserved fall of unknown etiology #2 hematoma right temple  #3 anisocoria, questionable age #4 baseline dementia #5 left buttock skin lesion, rule out herpes versus cellulitis Plan: see orders CT imaging Monitor left buttocks lesion for progression

## 2015-10-16 NOTE — ED Notes (Signed)
Pt was seen here earlier after a fall and had a negative head ct.  Pt returned to [facility] and during a visit with family, it was noted by family that pt "was not acting himself"   Per staff at facility, pt's left pupil is larger than the right and pt is more confused.    Pt is noted to also have a possible abscess starting on his coccyx.

## 2015-10-16 NOTE — Progress Notes (Signed)
ANTIBIOTIC CONSULT NOTE-Preliminary  Pharmacy Consult for Vancomycin and Zosyn Indication: Cellulitis  Allergies  Allergen Reactions  . Statins     Possible cause of weakness    Patient Measurements:   Vital Signs: Temp: 97.7 F (36.5 C) (04/19 2019) Temp Source: Oral (04/19 2019) BP: 124/53 mmHg (04/19 2330) Pulse Rate: 89 (04/19 2330)  Labs:  Recent Labs  10/16/15 1200 10/16/15 2053  WBC 9.1 10.4  HGB 15.5 14.1  PLT 70* 68*  CREATININE 0.82 0.83    Estimated Creatinine Clearance: 77.9 mL/min (by C-G formula based on Cr of 0.83).  No results for input(s): VANCOTROUGH, VANCOPEAK, VANCORANDOM, GENTTROUGH, GENTPEAK, GENTRANDOM, TOBRATROUGH, TOBRAPEAK, TOBRARND, AMIKACINPEAK, AMIKACINTROU, AMIKACIN in the last 72 hours.   Microbiology: Recent Results (from the past 720 hour(s))  Culture, blood (routine x 2)     Status: None (Preliminary result)   Collection Time: 10/16/15  9:30 PM  Result Value Ref Range Status   Specimen Description LEFT ANTECUBITAL  Final   Special Requests BOTTLES DRAWN AEROBIC AND ANAEROBIC 6CC  Final   Culture PENDING  Incomplete   Report Status PENDING  Incomplete  Culture, blood (routine x 2)     Status: None (Preliminary result)   Collection Time: 10/16/15  9:45 PM  Result Value Ref Range Status   Specimen Description LEFT ANTECUBITAL  Final   Special Requests BOTTLES DRAWN AEROBIC AND ANAEROBIC 6CC  Final   Culture PENDING  Incomplete   Report Status PENDING  Incomplete    Medical History: Past Medical History  Diagnosis Date  . Hyperlipidemia     Statin intolerance as leg weakness  . Depression   . Mild memory disturbance   . Assistance needed for mobility     walks with walker  . Hypothyroid     Medications:   Assessment: 82 yo [facility] of altered mental status. Abscess noted on coccyx; now s/p drainage. Pt has elevated WBCs; Blood cultures drawn. Empiric antibiotics for cellulitis.  Goal  of Therapy:  Vancomycin troughs 10-15 mcg/ml Eradicated infection  Plan:  Preliminary review of pertinent patient information completed.  Protocol will be initiated with one-time doses of Vancomycin 1 Gm IV and Zosyn 3.375 Gm IV.  [facility]king[facility] clinical pharmacist will complete review during morning rounds to assess patient and finalize treatment regimen.  Arelia SneddonMason, Akyah Lagrange Anne, [facility] CenterRPH 10/16/2015,11:47 PM

## 2015-10-16 NOTE — Patient Instructions (Signed)
orders for Matrix entry CBC and differential BMET CT head w and w/o

## 2015-10-17 ENCOUNTER — Encounter (HOSPITAL_COMMUNITY): Payer: Self-pay | Admitting: Family Medicine

## 2015-10-17 ENCOUNTER — Observation Stay (HOSPITAL_COMMUNITY): Payer: Medicare Other

## 2015-10-17 DIAGNOSIS — F329 Major depressive disorder, single episode, unspecified: Secondary | ICD-10-CM | POA: Diagnosis present

## 2015-10-17 DIAGNOSIS — L0390 Cellulitis, unspecified: Secondary | ICD-10-CM

## 2015-10-17 DIAGNOSIS — D696 Thrombocytopenia, unspecified: Secondary | ICD-10-CM | POA: Diagnosis present

## 2015-10-17 DIAGNOSIS — R296 Repeated falls: Secondary | ICD-10-CM | POA: Diagnosis present

## 2015-10-17 DIAGNOSIS — F0390 Unspecified dementia without behavioral disturbance: Secondary | ICD-10-CM

## 2015-10-17 DIAGNOSIS — R627 Adult failure to thrive: Secondary | ICD-10-CM | POA: Diagnosis present

## 2015-10-17 DIAGNOSIS — R1312 Dysphagia, oropharyngeal phase: Secondary | ICD-10-CM | POA: Diagnosis not present

## 2015-10-17 DIAGNOSIS — E872 Acidosis: Secondary | ICD-10-CM | POA: Diagnosis present

## 2015-10-17 DIAGNOSIS — Z9181 History of falling: Secondary | ICD-10-CM | POA: Diagnosis not present

## 2015-10-17 DIAGNOSIS — L0231 Cutaneous abscess of buttock: Secondary | ICD-10-CM | POA: Diagnosis not present

## 2015-10-17 DIAGNOSIS — A419 Sepsis, unspecified organism: Secondary | ICD-10-CM | POA: Diagnosis present

## 2015-10-17 DIAGNOSIS — E038 Other specified hypothyroidism: Secondary | ICD-10-CM | POA: Diagnosis not present

## 2015-10-17 DIAGNOSIS — R4182 Altered mental status, unspecified: Secondary | ICD-10-CM | POA: Diagnosis present

## 2015-10-17 DIAGNOSIS — F32A Depression, unspecified: Secondary | ICD-10-CM | POA: Diagnosis present

## 2015-10-17 DIAGNOSIS — R262 Difficulty in walking, not elsewhere classified: Secondary | ICD-10-CM | POA: Diagnosis not present

## 2015-10-17 DIAGNOSIS — R278 Other lack of coordination: Secondary | ICD-10-CM | POA: Diagnosis not present

## 2015-10-17 DIAGNOSIS — E039 Hypothyroidism, unspecified: Secondary | ICD-10-CM | POA: Diagnosis not present

## 2015-10-17 DIAGNOSIS — E440 Moderate protein-calorie malnutrition: Secondary | ICD-10-CM

## 2015-10-17 DIAGNOSIS — E785 Hyperlipidemia, unspecified: Secondary | ICD-10-CM | POA: Diagnosis not present

## 2015-10-17 DIAGNOSIS — G9341 Metabolic encephalopathy: Secondary | ICD-10-CM | POA: Diagnosis present

## 2015-10-17 DIAGNOSIS — M6281 Muscle weakness (generalized): Secondary | ICD-10-CM | POA: Diagnosis not present

## 2015-10-17 DIAGNOSIS — L0291 Cutaneous abscess, unspecified: Secondary | ICD-10-CM | POA: Insufficient documentation

## 2015-10-17 DIAGNOSIS — R55 Syncope and collapse: Secondary | ICD-10-CM | POA: Diagnosis not present

## 2015-10-17 DIAGNOSIS — L03317 Cellulitis of buttock: Secondary | ICD-10-CM | POA: Diagnosis present

## 2015-10-17 DIAGNOSIS — Z6827 Body mass index (BMI) 27.0-27.9, adult: Secondary | ICD-10-CM | POA: Diagnosis not present

## 2015-10-17 LAB — BASIC METABOLIC PANEL
Anion gap: 7 (ref 5–15)
BUN: 10 mg/dL (ref 6–20)
CHLORIDE: 108 mmol/L (ref 101–111)
CO2: 21 mmol/L — ABNORMAL LOW (ref 22–32)
Calcium: 8.2 mg/dL — ABNORMAL LOW (ref 8.9–10.3)
Creatinine, Ser: 0.72 mg/dL (ref 0.61–1.24)
GFR calc Af Amer: >60 mL/min (ref >60)
GFR calc non Af Amer: >60 mL/min (ref >60)
GLUCOSE: 109 mg/dL — AB (ref 65–99)
POTASSIUM: 3.8 mmol/L (ref 3.5–5.1)
Sodium: 136 mmol/L (ref 135–145)

## 2015-10-17 LAB — PROTIME-INR
INR: 1.15 (ref 0.00–1.49)
Prothrombin Time: 14.9 seconds (ref 11.6–15.2)

## 2015-10-17 LAB — PROCALCITONIN: PROCALCITONIN: 0.11 ng/mL

## 2015-10-17 LAB — VITAMIN B12: Vitamin B-12: 377 pg/mL (ref 180–914)

## 2015-10-17 LAB — LACTIC ACID, PLASMA
LACTIC ACID, VENOUS: 1.1 mmol/L (ref 0.5–2.0)
Lactic Acid, Venous: 2.2 mmol/L (ref 0.5–2.0)

## 2015-10-17 LAB — GLUCOSE, CAPILLARY: Glucose-Capillary: 103 mg/dL — ABNORMAL HIGH (ref 65–99)

## 2015-10-17 LAB — T4, FREE: Free T4: 1.49 ng/dL — ABNORMAL HIGH (ref 0.61–1.12)

## 2015-10-17 LAB — APTT: aPTT: 32 seconds (ref 24–37)

## 2015-10-17 LAB — MRSA PCR SCREENING: MRSA BY PCR: POSITIVE — AB

## 2015-10-17 MED ORDER — POLYETHYLENE GLYCOL 3350 17 G PO PACK: 17.0000 g | PACK | Freq: Every day | ORAL | Status: DC | PRN

## 2015-10-17 MED ORDER — ONDANSETRON HCL 4 MG PO TABS: 4.0000 mg | ORAL_TABLET | Freq: Four times a day (QID) | ORAL | Status: DC | PRN

## 2015-10-17 MED ORDER — ONDANSETRON HCL 4 MG/2ML IJ SOLN: 4.0000 mg | Freq: Four times a day (QID) | INTRAMUSCULAR | Status: DC | PRN

## 2015-10-17 MED ORDER — FERROUS SULFATE 325 (65 FE) MG PO TABS
325.0000 mg | ORAL_TABLET | Freq: Every morning | ORAL | Status: DC
  Administered 2015-10-17: 325 mg via ORAL
  Filled 2015-10-17: qty 1

## 2015-10-17 MED ORDER — SODIUM CHLORIDE 0.9 % IV SOLN
INTRAVENOUS | Status: AC
  Administered 2015-10-17: 01:00:00 via INTRAVENOUS

## 2015-10-17 MED ORDER — SERTRALINE HCL 50 MG PO TABS
50.0000 mg | ORAL_TABLET | Freq: Every day | ORAL | Status: DC
  Administered 2015-10-17 – 2015-10-20 (×4): 50 mg via ORAL
  Filled 2015-10-17 (×4): qty 1

## 2015-10-17 MED ORDER — CHLORHEXIDINE GLUCONATE CLOTH 2 % EX PADS
6.0000 | MEDICATED_PAD | Freq: Every day | CUTANEOUS | Status: DC
  Administered 2015-10-17 – 2015-10-20 (×3): 6 via TOPICAL

## 2015-10-17 MED ORDER — BISACODYL 5 MG PO TBEC: 5.0000 mg | DELAYED_RELEASE_TABLET | Freq: Every day | ORAL | Status: DC | PRN

## 2015-10-17 MED ORDER — VANCOMYCIN HCL IN DEXTROSE 1-5 GM/200ML-% IV SOLN
1000.0000 mg | Freq: Two times a day (BID) | INTRAVENOUS | Status: DC
  Administered 2015-10-17 – 2015-10-20 (×7): 1000 mg via INTRAVENOUS
  Filled 2015-10-17 (×7): qty 200

## 2015-10-17 MED ORDER — LEVOTHYROXINE SODIUM 112 MCG PO TABS
112.0000 ug | ORAL_TABLET | Freq: Every day | ORAL | Status: DC
  Administered 2015-10-17 – 2015-10-20 (×4): 112 ug via ORAL
  Filled 2015-10-17 (×4): qty 1

## 2015-10-17 MED ORDER — HYDROCODONE-ACETAMINOPHEN 5-325 MG PO TABS: 1.0000 | ORAL_TABLET | ORAL | Status: DC | PRN

## 2015-10-17 MED ORDER — ACETAMINOPHEN 650 MG RE SUPP: 650.0000 mg | Freq: Four times a day (QID) | RECTAL | Status: DC | PRN

## 2015-10-17 MED ORDER — MUPIROCIN 2 % EX OINT
1.0000 "application " | TOPICAL_OINTMENT | Freq: Two times a day (BID) | CUTANEOUS | Status: DC
  Administered 2015-10-17 – 2015-10-20 (×6): 1 via NASAL
  Filled 2015-10-17: qty 22

## 2015-10-17 MED ORDER — DOCUSATE SODIUM 100 MG PO CAPS
100.0000 mg | ORAL_CAPSULE | Freq: Two times a day (BID) | ORAL | Status: DC
  Administered 2015-10-17 (×2): 100 mg via ORAL
  Filled 2015-10-17 (×2): qty 1

## 2015-10-17 MED ORDER — PIPERACILLIN-TAZOBACTAM 3.375 G IVPB
3.3750 g | Freq: Three times a day (TID) | INTRAVENOUS | Status: DC
  Administered 2015-10-17 – 2015-10-20 (×11): 3.375 g via INTRAVENOUS
  Filled 2015-10-17 (×10): qty 50

## 2015-10-17 MED ORDER — ENSURE ENLIVE PO LIQD
237.0000 mL | Freq: Two times a day (BID) | ORAL | Status: DC
  Administered 2015-10-17 – 2015-10-20 (×5): 237 mL via ORAL

## 2015-10-17 MED ORDER — ACETAMINOPHEN 325 MG PO TABS: 650.0000 mg | ORAL_TABLET | Freq: Four times a day (QID) | ORAL | Status: DC | PRN

## 2015-10-17 MED ORDER — SENNOSIDES-DOCUSATE SODIUM 8.6-50 MG PO TABS
1.0000 | ORAL_TABLET | Freq: Every day | ORAL | Status: DC
  Administered 2015-10-17 – 2015-10-19 (×3): 1 via ORAL
  Filled 2015-10-17 (×3): qty 1

## 2015-10-17 MED ORDER — LEVETIRACETAM 500 MG PO TABS
500.0000 mg | ORAL_TABLET | Freq: Two times a day (BID) | ORAL | Status: DC
  Administered 2015-10-17 – 2015-10-20 (×8): 500 mg via ORAL
  Filled 2015-10-17 (×8): qty 1

## 2015-10-17 MED ORDER — ADULT MULTIVITAMIN W/MINERALS CH
1.0000 | ORAL_TABLET | Freq: Every day | ORAL | Status: DC
  Administered 2015-10-17 – 2015-10-20 (×4): 1 via ORAL
  Filled 2015-10-17 (×4): qty 1

## 2015-10-17 NOTE — Clinical Social Work Note (Signed)
Clinical Social Work Assessment  Patient Details  Name: Trevor Brock MRN: [id number] Date of Birth: 21-Dec-1932  Date of referral:  10/17/15               Reason for consult:  Discharge Planning                Permission sought to share information with:    Permission granted to share information::     Name::        Agency::     Relationship::     Contact Information:     Housing/Transportation Living arrangements for the past 2 months:  [place] of Information:  Spouse Patient Interpreter Needed:  None Criminal Activity/Legal Involvement Pertinent to Current Situation/Hospitalization:  No - Comment as needed Significant Relationships:  Spouse Lives with:  Facility Resident Do you feel safe going back to the place where you live?  Yes Need for family participation in patient care:  Yes (Comment)  Care giving concerns:  Pt's wife is considering taking him home from SNF soon, but is not sure how she will manage.    Social Worker assessment / plan:  CSW met with pt's wife at bedside. Pt sleeping soundly. Pt's wife reports he has been a resident at [facility] for about 2 weeks now. Pt fell the first of April and hit his head and also had a seizure. He transferred to [facility] for rehab after hospital stay. Pt fell yesterday at [facility] and was brought to ED for evaluation due to altered mental status. Pt's wife shared that she is very hopeful to take him home next week, but she is really worried about repeat falls. She would be primary caregiver as only 2 children live in town and both work. Per Tami at facility, pt is okay to return and no FL2 needed. Tami is aware of wife's concerns about taking pt home and has discussed applying for Medicaid with her.   Employment status:  Retired Nurse, adult PT Recommendations:  Not assessed at this time Information / Referral to community resources:  Other (Comment Required) (return to [facility])  Patient/Family's  Response to care:  Pt's wife was very tearful and admits to feeling overwhelmed. CSW discussed situation with her and she states that her husband really wants to return home and she wants to do that for him, but she is not sure how she will manage. Pt's wife indicates she may apply for Medicaid, but is not sure that they will qualify. She is also worried about hospital bills. CSW provided support and left voicemail for financial counselor to follow up.   Patient/Family's Understanding of and Emotional Response to Diagnosis, Current Treatment, and Prognosis:  Pt's wife is aware of admission diagnosis and shared that his confusion has been much worse since his fall the first of April. Support provided.   Emotional Assessment Appearance:  Appears stated age Attitude/Demeanor/Rapport:  Unable to Assess Affect (typically observed):  Unable to Assess Orientation:   (unable to assess) Alcohol / Substance use:  Not Applicable Psych involvement (Current and /or in the community):  No (Comment)  Discharge Needs  Concerns to be addressed:  Discharge Planning Concerns, Coping/Stress Concerns Readmission within the last 30 days:  Yes Current discharge risk:  Physical Impairment, Cognitively Impaired Barriers to Discharge:  Continued Medical Work up   Salome Arnt, [place] 10/17/2015, 10:15 AM [phone]

## 2015-10-17 NOTE — Progress Notes (Addendum)
Fever resolved, lactic acidosis normalized, blood and urine culture pending, no wound culture. mrsa screen positive, continue vanc/zosyn for now, consider doxycycline at discharge. Wife updated in room.

## 2015-10-17 NOTE — Care Management Obs Status (Signed)
MEDICARE OBSERVATION STATUS NOTIFICATION   Patient Details  Name: Trevor Brock MRN: [id number] Date of Birth: Jun 03, 1933   Medicare Observation Status Notification Given:  Yes    [name], Luvenia Cranford L, RN 10/17/2015, 12:24 PM

## 2015-10-17 NOTE — H&P (Signed)
History and Physical    Trevor Readinghomas G Schimming VWU:[id number] DOB: June 06, 1933 DOA: 10/16/2015  Referring Provider: Dr. Effie ShyWentz (EDP) PCP: Marga MelnickWilliam Hopper, MD  Outpatient Specialists:  None listed  Patient coming from: [facility]  Chief Complaint: Altered mental status  HPI: Trevor Brock is a 82 y.o. male with medical history significant for dementia, depression, and hypothyroidism who presents to the ED with acute encephalopathy and recurrent falls. The patient's health has been declining over recent months and he had an admission earlier this month following an apparent syncopal episode that was thought to possibly have been a first seizure. Patient was discharged to a SNF with Keppra. He has continued to struggle at the SNF, falling with increasing frequency. He was evaluated with head CT earlier today for follow and there were no acute intracranial abnormalities identified. Upon his return to the SNF however, his family noted him to be "acting differently" and he was returned to the ED for further evaluation of this.  ED Course: Upon arrival to the ED, patient is found to be febrile to 39.2 C, saturating well on room air, and with vitals otherwise stable. Head CT was repeated and again negative for acute intracranial abnormality. Chest x-ray was negative for acute cardiopulmonary disease. CMP is notable for mild elevation in total bilirubin. CBC features a thrombocytopenia to 68,000 and lactic acid is elevated to 2.84. Urine is obtained for analysis and grossly negative for infection. An abscess with surrounding cellulitis was noted at the left buttock and this was incised and drained by the EDP. Blood and urine cultures were obtained in the emergency department, the patient was bolused with 30 cc/kg NS, and empiric antibiotics were initiated with vancomycin and Zosyn. Patient remained hemodynamically stable in the emergency department and will be admitted to the hospital for ongoing evaluation and  management of acute encephalopathy with sepsis, suspected secondary to cellulitis with abscess.  Review of Systems:  All other systems reviewed and apart from HPI, are negative.  Past Medical History  Diagnosis Date  . Hyperlipidemia     Statin intolerance as leg weakness  . Depression   . Mild memory disturbance   . Assistance needed for mobility     walks with walker  . Hypothyroid     Past Surgical History  Procedure Laterality Date  . Cholecystectomy    . Tonsillectomy    . Retinal detachment surgery Right      reports that he has never smoked. He does not have any smokeless tobacco history on file. He reports that he does not drink alcohol or use illicit drugs.  Allergies  Allergen Reactions  . Statins     Possible cause of weakness    Family History  Problem Relation Age of Onset  . Cancer Mother     Diffuse metastases of unknown primary  . Cancer Sister     Breast  . Cancer Brother     Gastrointestinal  . Stroke Neg Hx   . Diabetes Neg Hx   . Heart disease Neg Hx      Prior to Admission medications   Medication Sig Start Date End Date Taking? Authorizing Provider  docusate sodium (COLACE) 100 MG capsule Take 100 mg by mouth 2 (two) times daily.   Yes Historical Provider, MD  ferrous sulfate 325 (65 FE) MG tablet Take 325 mg by mouth every morning.   Yes Historical Provider, MD  levETIRAcetam (KEPPRA) 500 MG tablet Take 1 tablet (500 mg total) by mouth 2 (  two) times daily. 10/03/15  Yes Dorothea Ogle, MD  levothyroxine (SYNTHROID, LEVOTHROID) 112 MCG tablet Take one tablet by mouth 30 minutes before breakfast once daily for thyroid 09/15/15  Yes Historical Provider, MD  sertraline (ZOLOFT) 50 MG tablet Take 1 tablet (50 mg total) by mouth daily. 10/03/15  Yes Dorothea Ogle, MD  UNABLE TO FIND Med Name: Magic Cup twice daily   Yes Historical Provider, MD  zinc oxide (BALMEX) 11.3 % CREA cream Apply 1 application topically daily. Apply every shift and as needed(to  sacrum and bilateral buttocks)   Yes Historical Provider, MD    Physical Exam: Filed Vitals:   10/16/15 2200 10/16/15 2330 10/17/15 0000 10/17/15 0022  BP: 137/65 124/53 123/50   Pulse: 92 89 88   Temp:    98.7 F (37.1 C)  TempSrc:    Rectal  Resp: 18  18   SpO2: 99% 98% 98%       Constitutional: NAD, calm, comfortable. Appears chronically-ill.  Eyes:  No scleral icterus or injection; lids and conjunctivae normal ENMT: Mucous membranes are moist. Posterior pharynx clear of any exudate or lesions.    Neck: normal, supple, no masses, no thyromegaly Respiratory: clear to auscultation bilaterally, no wheezing, no crackles. Normal respiratory effort. No accessory muscle use.  Cardiovascular: S1 & S2 heard, regular rate and rhythm, soft systolic murmur at LSB, no rubs / gallops. No extremity edema. 2+ pedal pulses.    Abdomen: No distension, no tenderness, no masses palpated. Bowel sounds normal.  Musculoskeletal: no clubbing / cyanosis. No joint deformity upper and lower extremities. No contractures.    Skin:  Abscess at left buttock s/p I&D with bloody packing in place and surrounding erythema, edema. Petechial rash to b/l distal LEs.  Neurologic:  Alert, confused and disoriented; CN 2-12 grossly intact. Sensation intact, DTR normal. Strength 5/5 in all 4 limbs.  Psychiatric: Calm, cooperative, difficult to assess given confusional state.     Labs on Admission: I have personally reviewed following labs and imaging studies  CBC:  Recent Labs Lab 10/16/15 1200 10/16/15 2053  WBC 9.1 10.4  NEUTROABS 6.1 7.8*  HGB 15.5 14.1  HCT 47.6 43.7  MCV 95.4 95.0  PLT 70* 68*   Basic Metabolic Panel:  Recent Labs Lab 10/16/15 1200 10/16/15 2053  NA 138 135  K 4.5 3.9  CL 104 103  CO2 22 22  GLUCOSE 102* 107*  BUN 11 11  CREATININE 0.82 0.83  CALCIUM 9.7 9.4   GFR: Estimated Creatinine Clearance: 77.9 mL/min (by C-G formula based on Cr of 0.83). Liver Function  Tests:  Recent Labs Lab 10/16/15 2053  AST 51*  ALT 33  ALKPHOS 216*  BILITOT 2.0*  PROT 7.4  ALBUMIN 4.0   No results for input(s): LIPASE, AMYLASE in the last 168 hours. No results for input(s): AMMONIA in the last 168 hours. Coagulation Profile: No results for input(s): INR, PROTIME in the last 168 hours. Cardiac Enzymes: No results for input(s): CKTOTAL, CKMB, CKMBINDEX, TROPONINI in the last 168 hours. BNP (last 3 results) No results for input(s): PROBNP in the last 8760 hours. HbA1C: No results for input(s): HGBA1C in the last 72 hours. CBG: No results for input(s): GLUCAP in the last 168 hours. Lipid Profile: No results for input(s): CHOL, HDL, LDLCALC, TRIG, CHOLHDL, LDLDIRECT in the last 72 hours. Thyroid Function Tests: No results for input(s): TSH, T4TOTAL, FREET4, T3FREE, THYROIDAB in the last 72 hours. Anemia Panel: No results for input(s): VITAMINB12,  FOLATE, FERRITIN, TIBC, IRON, RETICCTPCT in the last 72 hours. Urine analysis:    Component Value Date/Time   COLORURINE YELLOW 10/16/2015 2208   APPEARANCEUR CLEAR 10/16/2015 2208   LABSPEC 1.015 10/16/2015 2208   PHURINE 7.5 10/16/2015 2208   GLUCOSEU NEGATIVE 10/16/2015 2208   HGBUR MODERATE* 10/16/2015 2208   BILIRUBINUR NEGATIVE 10/16/2015 2208   KETONESUR TRACE* 10/16/2015 2208   PROTEINUR NEGATIVE 10/16/2015 2208   NITRITE NEGATIVE 10/16/2015 2208   LEUKOCYTESUR NEGATIVE 10/16/2015 2208   Sepsis Labs: (procalcitonin:4,lacticidven:4) ) Recent Results (from the past 240 hour(s))  Culture, blood (routine x 2)     Status: None (Preliminary result)   Collection Time: 10/16/15  9:30 PM  Result Value Ref Range Status   Specimen Description LEFT ANTECUBITAL  Final   Special Requests BOTTLES DRAWN AEROBIC AND ANAEROBIC 6CC  Final   Culture PENDING  Incomplete   Report Status PENDING  Incomplete  Culture, blood (routine x 2)     Status: None (Preliminary result)   Collection Time: 10/16/15   9:45 PM  Result Value Ref Range Status   Specimen Description LEFT ANTECUBITAL  Final   Special Requests BOTTLES DRAWN AEROBIC AND ANAEROBIC 6CC  Final   Culture PENDING  Incomplete   Report Status PENDING  Incomplete     Radiological Exams on Admission: Dg Chest 2 View  10/16/2015  CLINICAL DATA:  Worsening confusion. EXAM: CHEST  2 VIEW COMPARISON:  09/28/2015 FINDINGS: Heart size is normal. The aorta shows atherosclerosis. Lungs are clear. No effusions. No bony abnormality. IMPRESSION: No active cardiopulmonary disease. Electronically Signed   By: Paulina Fusi M.D.   On: 10/16/2015 23:10   Ct Head Wo Contrast  10/16/2015  CLINICAL DATA:  Altered mental status today, with increasing confusion since the prior CT scan from earlier today. EXAM: CT HEAD WITHOUT CONTRAST TECHNIQUE: Contiguous axial images were obtained from the base of the skull through the vertex without intravenous contrast. COMPARISON:  10/16/2015 at 11:18 a.m. FINDINGS: The brainstem, cerebellum, cerebral peduncles, thalami, basal ganglia, basilar cisterns, and ventricular system appear within normal limits. Periventricular white matter and corona radiata hypodensities favor chronic ischemic microvascular white matter disease. No intracranial hemorrhage, mass lesion, or acute CVA. Mild scalp hematoma above the right ear. No underlying calvarial fracture. Mild chronic ethmoid sinusitis. IMPRESSION: 1. Right scalp hematoma, stable.  No acute intracranial findings. 2. Periventricular white matter and corona radiata hypodensities favor chronic ischemic microvascular white matter disease. 3. Mild chronic ethmoid sinusitis. Electronically Signed   By: Gaylyn Rong M.D.   On: 10/16/2015 21:36   Ct Head W Wo Contrast  10/16/2015  CLINICAL DATA:  Larey Seat from bed today.  Scalp hematoma.  Anisocoria. EXAM: CT HEAD WITHOUT AND WITH CONTRAST TECHNIQUE: Contiguous axial images were obtained from the base of the skull through the vertex  without and with intravenous contrast CONTRAST:  75mL ISOVUE-300 IOPAMIDOL (ISOVUE-300) INJECTION 61% COMPARISON:  MRI 09/29/2015 FINDINGS: Mild atrophy.  Negative for hydrocephalus. Negative for acute infarct. Mild chronic microvascular ischemic change in the white matter. Negative for hemorrhage or mass. Postcontrast imaging demonstrates normal enhancement. No enhancing mass lesion. Vascular enhancement normal. Right parietal scalp hematoma. Negative for skull fracture. Paranasal sinuses clear. IMPRESSION: Atrophy and mild chronic microvascular ischemic change. No acute intracranial abnormality. Right parietal scalp hematoma. Electronically Signed   By: Marlan Palau M.D.   On: 10/16/2015 11:52    EKG: Ordered and pending.  Assessment/Plan  1. Sepsis secondary to cellulitis with abscess  - Meets  sepsis criteria on admission with fever, elevated lactate, AMS  - Source is likely cellulitis of left buttock with abscess; I&D performed in ED   - Blood and urine cultures are incubating  - Initial lactate 2.84, trending  - 30 cc/kg NS bolus given in ED, continuing with NS infusion at 100 cc/hr overnight  - Empiric vancomycin and Zosyn started in ED, will continue while awaiting culture data - Check procalcitonin    2. Acute encephalopathy    - Suspected secondary to sepsis  - Head CT negative for acute intracranial abnormalities  - TSH wnl earlier this month; check Keppra level for toxicity - Anticipate improvement with treatment of sepsis  - If fails to improve as expected, may need to extend workup   3. Thrombocytopenia  - Platelet count 68,000 on admission - Chronic, stable relative to priors - No sign of active blood loss   - Holding pharmacologic VTE ppx; SCD only for now    4. Hypothyroidism  - TSH wnl earlier this month  - Continue current dose Synthroid     5. Depression  - Difficult to assess at this time given the acute encephalopathy  - Continue Zoloft 50 mg qD; dose reduced  from 100 mg during recent admit at recommendation of neurology to minimize cognitive side-effects     DVT prophylaxis: SCDs  Code Status: Full  Family Communication: Pt's son at bedside.   Disposition Plan: Admit to med-surg  Consults called: None   Admission status: Observation    Briscoe Deutscher MD [facility] Pager [phone]  If 7PM-7AM, please contact night-coverage [email or web address] Password [facility]  10/17/2015, 12:28 AM

## 2015-10-17 NOTE — Progress Notes (Signed)
Initial Nutrition Assessment  DOCUMENTATION CODES:  Non-severe (moderate) malnutrition in context of acute illness/injury   Pt meets criteria for MODERATE MALNUTRITION in the context of Acute illness as evidenced by Loss of 1-2 % bw in 1 week and an oral intake that is estimated to have met < or equal to 50% of needs for > or equal to 5 days.  INTERVENTION:  Ensure Enlive po BID, each supplement provides 350 kcal and 20 grams of protein  Mvi W/ minerals  NUTRITION DIAGNOSIS:  Increased nutrient needs related to wound healing, acute illness as evidenced by estimated nutritional requirements for wound healing/degree of acute illness  GOAL:  Patient will meet greater than or equal to 90% of their needs  MONITOR:  PO intake, Supplement acceptance, Labs  REASON FOR ASSESSMENT:  Malnutrition Screening Tool    ASSESSMENT:  82 y/o male PMHx dementia, depression, hypothyroidism who presents from SNF w/ acute encephalopathy and recurrent falls. In ED, pt was worked noted to have cellulitis w/ buttock abscess. Admitted for ongoing evaluation of acute encephalopathy and sepsis suspected to be secondary to skin wound  Pt presented from SNF. Per SNF documentation. Pt was noted to have decreased PO intake the last 4 days (<50% of meals), which was abnormal as he was consuming 75-100% before that.   Pt was working w/ ST for cognition. However diet was downgraded to D3 to help pt eat. He was on the "no added salt" therapeutic option. He was receiving Magic Cup BID and Iron supplements.   Family had reported that pt weighed ~220 lbs a few months ago, but there was no documentation to confirm this. His weight from 4/1 was almost assuredly reported/taken from the previous encounter.   Today, wife in room reports that pt ate well. She didn't think that the difference in diet consistentcy (d3 vs normal) made any difference. She says SLP at SNF did not appreciate any signs of dysphagia. She confirms that  pt has had a poor appetite for the past 4-5 days. There was no n/v/c/d associated with the loss of appetite. Pt had a BM this morning.   RD went over available supplements. She did not think the specific supplement mattered, rather just how much of an appetite the pt has when it is given.   Asked about B12 labs. Wife was unsure. One is currently pending  RD to follow once returns to [facility].   NFPE: WDL.   Labs reviewed: Lactic acid was 2.84 on admission.   Recent Labs Lab 10/16/15 1200 10/16/15 2053 10/17/15 0332  NA 138 135 136  K 4.5 3.9 3.8  CL 104 103 108  CO2 22 22 21*  BUN _0 CREATININE 0.82 0.83 0.72  CALCIUM 9.7 9.4 8.2*  GLUCOSE 102* 107* 109*    Diet Order:  Diet regular Room service appropriate?: Yes; Fluid consistency:: Thin  Skin: Abscess to L buttock with surrounding cellulitis s/p I&D, abrasion to hand, MSAD to groin/leg. Ecchymosis to head (hematoma to r head), msad to groin and leg  Last BM:  4/20  Height:  Ht Readings from Last 1 Encounters:  10/17/15 _1  (1.778 m)   Weight:  Wt Readings from Last 1 Encounters:  10/17/15 194 lb 1.6 oz (88.043 kg)   Wt Readings from Last 10 Encounters:  10/17/15 194 lb 1.6 oz (88.043 kg)  10/08/15 197 lb (89.359 kg)  09/28/15 219 lb (99.338 kg)  07/30/09 219 lb (99.338 kg)  04/29/09 221 lb (100.245  kg)   Ideal Body Weight:  75.45 kg  BMI:  Body mass index is 27.85 kg/(m^2).  Estimated Nutritional Needs:  Kcal:  1950-2200 (22-25 kcal/kg bw) Protein:  98-113 g (1.3-1.5 g/kg ibw) Fluid:  2-2.2 liters bw  EDUCATION NEEDS:  No education needs identified at this time  Burtis Junes RD, LDN Clinical Nutrition Pager: [phone] 10/17/2015 10:33 AM

## 2015-10-17 NOTE — Progress Notes (Signed)
Pharmacy Antibiotic Note  Trevor Brock is a 82 y.o. male admitted on 10/16/2015 with cellulitis.  Pharmacy has been consulted for Vancomycin and zosyn dosing. Abscess noted on coccyx; now s/p drainage. Pt has elevated WBCs Plan: Vancomycin 1000mg  IV every 12 hours.  Goal trough 10-15 mcg/mL. Zosyn 3.375g IV q8h (4 hour infusion).  Height: 5\' 10"  (177.8 cm) Weight: 194 lb 1.6 oz (88.043 kg) IBW/kg (Calculated) : 73  Temp (24hrs), Avg:98.7 F (37.1 C), Min:97.3 F (36.3 C), Max:102.5 F (39.2 C)   Recent Labs Lab 10/16/15 1200 10/16/15 2053 10/16/15 2146 10/17/15 0332  WBC 9.1 10.4  --   --   CREATININE 0.82 0.83  --  0.72  LATICACIDVEN  --  2.2* 2.84* 1.1    Estimated Creatinine Clearance: 82.3 mL/min (by C-G formula based on Cr of 0.72).    Allergies  Allergen Reactions  . Statins     Possible cause of weakness    Antimicrobials this admission: Vancomycin 4/20 >>  Zosyn 4/20 >>   Microbiology results: 4/20 BCx: pending 4/20MRSA PCR: positive  Thank you for allowing pharmacy to be a part of this patient's care. Elder CyphersLorie Mari Battaglia, BS Loura BackPharm D, [place] Clinical Pharmacist Pager [phone] 10/17/2015 8:52 AM

## 2015-10-18 DIAGNOSIS — E038 Other specified hypothyroidism: Secondary | ICD-10-CM

## 2015-10-18 DIAGNOSIS — L0390 Cellulitis, unspecified: Secondary | ICD-10-CM

## 2015-10-18 LAB — CBC
HEMATOCRIT: 37.9 % — AB (ref 39.0–52.0)
Hemoglobin: 12.7 g/dL — ABNORMAL LOW (ref 13.0–17.0)
MCH: 31.5 pg (ref 26.0–34.0)
MCHC: 33.5 g/dL (ref 30.0–36.0)
MCV: 94 fL (ref 78.0–100.0)
Platelets: 60 10*3/uL — ABNORMAL LOW (ref 150–400)
RBC: 4.03 MIL/uL — ABNORMAL LOW (ref 4.22–5.81)
RDW: 15.3 % (ref 11.5–15.5)
WBC: 7.5 10*3/uL (ref 4.0–10.5)

## 2015-10-18 LAB — COMPREHENSIVE METABOLIC PANEL
ALT: 23 U/L (ref 17–63)
ANION GAP: 9 (ref 5–15)
AST: 29 U/L (ref 15–41)
Albumin: 2.9 g/dL — ABNORMAL LOW (ref 3.5–5.0)
Alkaline Phosphatase: 156 U/L — ABNORMAL HIGH (ref 38–126)
BILIRUBIN TOTAL: 1.9 mg/dL — AB (ref 0.3–1.2)
BUN: 9 mg/dL (ref 6–20)
CALCIUM: 8.6 mg/dL — AB (ref 8.9–10.3)
CO2: 21 mmol/L — ABNORMAL LOW (ref 22–32)
Chloride: 108 mmol/L (ref 101–111)
Creatinine, Ser: 0.82 mg/dL (ref 0.61–1.24)
GFR calc Af Amer: >60 mL/min (ref >60)
Glucose, Bld: 99 mg/dL (ref 65–99)
POTASSIUM: 3.5 mmol/L (ref 3.5–5.1)
Sodium: 138 mmol/L (ref 135–145)
TOTAL PROTEIN: 5.7 g/dL — AB (ref 6.5–8.1)

## 2015-10-18 LAB — HIV ANTIBODY (ROUTINE TESTING W REFLEX): HIV Screen 4th Generation wRfx: NONREACTIVE

## 2015-10-18 LAB — FOLATE RBC
FOLATE, HEMOLYSATE: 246.9 ng/mL
Folate, RBC: 625 ng/mL (ref >498)
HEMATOCRIT: 39.5 % (ref 37.5–51.0)

## 2015-10-18 LAB — URINE CULTURE: Culture: NO GROWTH

## 2015-10-18 LAB — RPR: RPR: NONREACTIVE

## 2015-10-18 LAB — MAGNESIUM: MAGNESIUM: 1.7 mg/dL (ref 1.7–2.4)

## 2015-10-18 LAB — FOLATE: Folate: 7.8 ng/mL (ref >5.9)

## 2015-10-18 NOTE — Progress Notes (Signed)
PROGRESS NOTE  Trevor Brock [id number] DOB: 1932-11-19 DOA: 10/16/2015 PCP: Marga MelnickWilliam Hopper, MD  HPI/Recap of past 24 hours:   Assessment/Plan: Principal Problem:   Sepsis (HCC) Active Problems:   Hypothyroid   Dementia   Cellulitis and abscess   Thrombocytopenia (HCC)   Depression   Sepsis, unspecified organism (HCC)   Malnutrition of moderate degree   Abscess  Sepsis presented on admission with fever, metabolic encephalopathy, abscess left buttock, lactic acidosis.  Treated with vanc/zosyn/ivf, better.  Abscess:  s/p I/d in the ED.  Wound care input appreciated:  Cleanse wound to left buttocks with NS. Gently fill wound bed with Iodoform packing strip. Cover with ABD/tape. Change daily.  Continue vanc/zosyn, consider change to bactrim in am  Thrombocytopenia: seems to be chronic  Hypothyroidism: on synthroid. tsh wnl 09/2015  Dementia/depression: baseline oriented to person only  FTT/falls: progressive weakness for the last few months,  Return to rehab at discharge. Fall precaution.   Code Status: full  Family Communication: patient , his wife , sister and daughter in room   Disposition Plan: return to snf in 1-2 days   Consultants:  Wound care  Procedures:  I/d left buttock abscess on 4/19 in the ED  Antibiotics:  Vanc/zosyn   Objective: BP 109/45 mmHg  Pulse 66  Temp(Src) 98.6 F (37 C) (Oral)  Resp 15  Ht 5\' 10"  (1.778 m)  Wt 87.499 kg (192 lb 14.4 oz)  BMI 27.68 kg/m2  SpO2 96%  Intake/Output Summary (Last 24 hours) at 10/18/15 1643 Last data filed at 10/18/15 1319  Gross per 24 hour  Intake    600 ml  Output    825 ml  Net   -225 ml   Filed Weights   10/17/15 0147 10/18/15 0614  Weight: 88.043 kg (194 lb 1.6 oz) 87.499 kg (192 lb 14.4 oz)    Exam:   General:  Much more alert and interactive, oriented to person only, per family now close to baseline  Cardiovascular: RRR  Respiratory: CTABL  Abdomen: Soft/ND/NT,  positive BS  Musculoskeletal: No Edema  Neuro: oriented to person only, per family now close to baseline  Skin: left buttock s/p i/d packing in place.  Data Reviewed: Basic Metabolic Panel:  Recent Labs Lab 10/16/15 1200 10/16/15 2053 10/17/15 0332 10/18/15 0624  NA 138 135 136 138  K 4.5 3.9 3.8 3.5  CL 104 103 108 108  CO2 22 22 21* 21*  GLUCOSE 102* 107* 109* 99  BUN 11 11 10 9   CREATININE 0.82 0.83 0.72 0.82  CALCIUM 9.7 9.4 8.2* 8.6*  MG  --   --   --  1.7   Liver Function Tests:  Recent Labs Lab 10/16/15 2053 10/18/15 0624  AST 51* 29  ALT 33 23  ALKPHOS 216* 156*  BILITOT 2.0* 1.9*  PROT 7.4 5.7*  ALBUMIN 4.0 2.9*   No results for input(s): LIPASE, AMYLASE in the last 168 hours. No results for input(s): AMMONIA in the last 168 hours. CBC:  Recent Labs Lab 10/16/15 1200 10/16/15 2053 10/17/15 0332 10/18/15 0624  WBC 9.1 10.4  --  7.5  NEUTROABS 6.1 7.8*  --   --   HGB 15.5 14.1  --  12.7*  HCT 47.6 43.7 39.5 37.9*  MCV 95.4 95.0  --  94.0  PLT 70* 68*  --  60*   Cardiac Enzymes:   No results for input(s): CKTOTAL, CKMB, CKMBINDEX, TROPONINI in the last 168 hours. BNP (last 3  results) No results for input(s): BNP in the last 8760 hours.  ProBNP (last 3 results) No results for input(s): PROBNP in the last 8760 hours.  CBG:  Recent Labs Lab 10/17/15 0805  GLUCAP 103*    Recent Results (from the past 240 hour(s))  Culture, blood (routine x 2)     Status: None (Preliminary result)   Collection Time: 10/16/15  9:30 PM  Result Value Ref Range Status   Specimen Description LEFT ANTECUBITAL  Final   Special Requests BOTTLES DRAWN AEROBIC AND ANAEROBIC 6CC  Final   Culture NO GROWTH 2 DAYS  Final   Report Status PENDING  Incomplete  Culture, blood (routine x 2)     Status: None (Preliminary result)   Collection Time: 10/16/15  9:45 PM  Result Value Ref Range Status   Specimen Description LEFT ANTECUBITAL  Final   Special Requests  BOTTLES DRAWN AEROBIC AND ANAEROBIC 6CC  Final   Culture NO GROWTH 2 DAYS  Final   Report Status PENDING  Incomplete  Urine culture     Status: None   Collection Time: 10/16/15 10:08 PM  Result Value Ref Range Status   Specimen Description URINE, CATHETERIZED  Final   Special Requests NONE  Final   Culture   Final    NO GROWTH 1 DAY Performed at [facility]    Report Status 10/18/2015 FINAL  Final  MRSA PCR Screening     Status: Abnormal   Collection Time: 10/17/15  1:34 AM  Result Value Ref Range Status   MRSA by PCR POSITIVE (A) NEGATIVE Final    Comment:        The GeneXpert MRSA Assay (FDA approved for NASAL specimens only), is one component of a comprehensive MRSA colonization surveillance program. It is not intended to diagnose MRSA infection nor to guide or monitor treatment for MRSA infections. RESULT CALLED TO, READ BACK BY AND VERIFIED WITH: Tommie Ard RN ON [id number] AT 0640 BY RESSEGGER R      Studies: No results found.  Scheduled Meds: . Chlorhexidine Gluconate Cloth  6 each Topical Q0600  . feeding supplement (ENSURE ENLIVE)  237 mL Oral BID BM  . levETIRAcetam  500 mg Oral BID  . levothyroxine  112 mcg Oral QAC breakfast  . multivitamin with minerals  1 tablet Oral Daily  . mupirocin ointment  1 application Nasal BID  . piperacillin-tazobactam (ZOSYN)  IV  3.375 g Intravenous Q8H  . senna-docusate  1 tablet Oral QHS  . sertraline  50 mg Oral Daily  . vancomycin  1,000 mg Intravenous Q12H    Continuous Infusions:    Time spent:  Karah Caruthers MD, PhD  [facility] Pager [phone]. If 7PM-7AM, please contact night-coverage at [email or web address], password [facility] 10/18/2015, 4:43 PM  LOS: 1 day

## 2015-10-18 NOTE — Care Management Important Message (Signed)
Important Message  Patient Details  Name: Trevor Brock MRN: [id number] Date of Birth: 1933-03-19   Medicare Important Message Given:  Yes    [name], [name], RN 10/18/2015, 12:36 PM

## 2015-10-18 NOTE — Consult Note (Signed)
WOC wound consult note Reason for Consult: I & D to left buttocks  Moisture Associated Skin Damage to groin Wound type: Infectious to buttocks Abrasions to head from fall MASD to groin  Condom cath in place and this is resolving Pressure Ulcer POA: N/A Measurement: 2 cm x 2 cm lacerated abscess to left buttocks Erythema to groin from urinary incontinence  Condom cath in place.  This is resolving.  Dermatherapy pillow cases to skin folds to wick moisture.  Wound bed: purulent drainage from abscess Drainage (amount, consistency, odor) Minimal serosanguinous  No odor.  Periwound:Erythema Dressing procedure/placement/frequency:Cleanse wound to left buttocks with NS.  Gently fill wound bed with Iodoform packing strip.  Cover with ABD/tape.  Change daily.  Keep skin folds clean and dry.   Dermatherapy pillowcase to skin folds to wick moisture.  Will not follow at this time.  Please re-consult if needed.  Maple HudsonKaren Antavious Spanos RN BSN CWON Pager [phone]

## 2015-10-18 NOTE — Clinical Social Work Note (Signed)
CSW updated [facility] on pt. Possible d/c this weekend. Facility aware and agreeable.  Derenda FennelKara Addalynne Golding, LCSW [phone]

## 2015-10-19 LAB — CBC
HCT: 37.5 % — ABNORMAL LOW (ref 39.0–52.0)
HEMOGLOBIN: 12.5 g/dL — AB (ref 13.0–17.0)
MCH: 31.1 pg (ref 26.0–34.0)
MCHC: 33.3 g/dL (ref 30.0–36.0)
MCV: 93.3 fL (ref 78.0–100.0)
Platelets: 68 10*3/uL — ABNORMAL LOW (ref 150–400)
RBC: 4.02 MIL/uL — AB (ref 4.22–5.81)
RDW: 15.3 % (ref 11.5–15.5)
WBC: 5.8 10*3/uL (ref 4.0–10.5)

## 2015-10-19 LAB — COMPREHENSIVE METABOLIC PANEL
ALBUMIN: 2.9 g/dL — AB (ref 3.5–5.0)
ALK PHOS: 176 U/L — AB (ref 38–126)
ALT: 23 U/L (ref 17–63)
AST: 28 U/L (ref 15–41)
Anion gap: 7 (ref 5–15)
BUN: 7 mg/dL (ref 6–20)
CALCIUM: 8.6 mg/dL — AB (ref 8.9–10.3)
CO2: 24 mmol/L (ref 22–32)
CREATININE: 0.92 mg/dL (ref 0.61–1.24)
Chloride: 107 mmol/L (ref 101–111)
GFR calc Af Amer: >60 mL/min (ref >60)
GFR calc non Af Amer: >60 mL/min (ref >60)
GLUCOSE: 96 mg/dL (ref 65–99)
Potassium: 3.9 mmol/L (ref 3.5–5.1)
SODIUM: 138 mmol/L (ref 135–145)
Total Bilirubin: 1.1 mg/dL (ref 0.3–1.2)
Total Protein: 5.8 g/dL — ABNORMAL LOW (ref 6.5–8.1)

## 2015-10-19 LAB — HEPATITIS PANEL, ACUTE
Hep A IgM: NEGATIVE
Hep B C IgM: NEGATIVE
Hepatitis B Surface Ag: NEGATIVE

## 2015-10-19 LAB — GLUCOSE, CAPILLARY
Glucose-Capillary: 87 mg/dL (ref 65–99)
Glucose-Capillary: 89 mg/dL (ref 65–99)

## 2015-10-19 LAB — MAGNESIUM: Magnesium: 1.9 mg/dL (ref 1.7–2.4)

## 2015-10-19 NOTE — Progress Notes (Signed)
PT consult placed per Dr. Roda ShuttersXu this morning, stated she would like patient to be seen today for discharge tomorrow. Received call from Hi-NellaKristen, PT who is on-call today and she stated they would only see ortho/neuro/discharges over the weekend and SNF does not appear appropriate at this time. Text-paged Dr. Roda ShuttersXu to notify. Earnstine RegalAshley Jaydence Vanyo, RN

## 2015-10-19 NOTE — Progress Notes (Signed)
Patient loss IV access around 2200, after 4 attempts [facility]. I also called the RN from ICU that can use US. If this is unsuccessful, I will make the MD aware.

## 2015-10-19 NOTE — Progress Notes (Signed)
PROGRESS NOTE  Trevor Brock ZOX:[id number] DOB: [birth year] DOA: 10/16/2015 PCP: Marga Melnick, MD  HPI/Recap of past 24 hours:  More alert and interactive, now baseline dementia, only oriented to person  Assessment/Plan: Principal Problem:   Sepsis (HCC) Active Problems:   Hypothyroid   Dementia   Cellulitis and abscess   Thrombocytopenia (HCC)   Depression   Sepsis, unspecified organism (HCC)   Malnutrition of moderate degree   Abscess  Sepsis presented on admission with fever, metabolic encephalopathy, abscess left buttock, lactic acidosis.  Treated with vanc/zosyn/ivf, better. Start Physical therapy, anticipate discharge on 4/23  Cellulitis/Abscess:  s/p I/d in the ED.  Wound care input appreciated:  Cleanse wound to left buttocks with NS. Gently fill wound bed with Iodoform packing strip. Cover with ABD/tape. Change daily.  Keep skin folds clean and dry.  Dermatherapy pillowcase to skin folds to wick moisture.   Continue vanc/zosyn, consider change to doxycycline at discharge  Thrombocytopenia: seems to be chronic  Hypothyroidism: on synthroid. tsh wnl 09/2015  Dementia/depression: baseline oriented to person only  FTT/falls: progressive weakness for the last few months,  Return to rehab at discharge. Fall precaution.   Code Status: full  Family Communication: patient ,   Disposition Plan: return to snf , likely on 4/23   Consultants:  Wound care  Procedures:  I/d left buttock abscess on 4/19 in the ED  Antibiotics:  Vanc/zosyn   Objective: BP 121/52 mmHg  Pulse 61  Temp(Src) 98.6 F (37 C) (Oral)  Resp 20  Ht  (1.778 m)  Wt 87.499 kg (192 lb 14.4 oz)  BMI 27.68 kg/m2  SpO2 99%  Intake/Output Summary (Last 24 hours) at 10/19/15 0854 Last data filed at 10/18/15 2254  Gross per 24 hour  Intake    240 ml  Output    800 ml  Net   -560 ml   Filed Weights   10/17/15 0147 10/18/15 0614  Weight: 88.043 kg (194 lb 1.6 oz)  87.499 kg (192 lb 14.4 oz)    Exam:   General:  Much more alert and interactive, oriented to person only, per family now close to baseline  Cardiovascular: RRR  Respiratory: CTABL  Abdomen: Soft/ND/NT, positive BS  Musculoskeletal: No Edema  Neuro: oriented to person only, per family now close to baseline  Skin: left buttock s/p i/d packing in place.  Data Reviewed: Basic Metabolic Panel:  Recent Labs Lab 10/16/15 1200 10/16/15 2053 10/17/15 0332 10/18/15 0624 10/19/15 0628  NA 138 135 136 138 138  K 4.5 3.9 3.8 3.5 3.9  CL 104 103 108 108 107  CO2 22 22 21* 21* 24  GLUCOSE 102* 107* 109* 99 96  BUN CREATININE 0.82 0.83 0.72 0.82 0.92  CALCIUM 9.7 9.4 8.2* 8.6* 8.6*  MG  --   --   --  1.7 1.9   Liver Function Tests:  Recent Labs Lab 10/16/15 2053 10/18/15 0624 10/19/15 0628  AST 51* 29 28  ALT 33 23 23  ALKPHOS 216* 156* 176*  BILITOT 2.0* 1.9* 1.1  PROT 7.4 5.7* 5.8*  ALBUMIN 4.0 2.9* 2.9*   No results for input(s): LIPASE, AMYLASE in the last 168 hours. No results for input(s): AMMONIA in the last 168 hours. CBC:  Recent Labs Lab 10/16/15 1200 10/16/15 2053 10/17/15 0332 10/18/15 0624 10/19/15 0628  WBC 9.1 10.4  --  7.5 5.8  NEUTROABS 6.1 7.8*  --   --   --  HGB 15.5 14.1  --  12.7* 12.5*  HCT 47.6 43.7 39.5 37.9* 37.5*  MCV 95.4 95.0  --  94.0 93.3  PLT 70* 68*  --  60* 68*   Cardiac Enzymes:   No results for input(s): CKTOTAL, CKMB, CKMBINDEX, TROPONINI in the last 168 hours. BNP (last 3 results) No results for input(s): BNP in the last 8760 hours.  ProBNP (last 3 results) No results for input(s): PROBNP in the last 8760 hours.  CBG:  Recent Labs Lab 10/17/15 0805 10/19/15 0756  GLUCAP 103* 87    Recent Results (from the past 240 hour(s))  Culture, blood (routine x 2)     Status: None (Preliminary result)   Collection Time: 10/16/15  9:30 PM  Result Value Ref Range Status   Specimen Description LEFT  ANTECUBITAL  Final   Special Requests BOTTLES DRAWN AEROBIC AND ANAEROBIC 6CC  Final   Culture NO GROWTH 2 DAYS  Final   Report Status PENDING  Incomplete  Culture, blood (routine x 2)     Status: None (Preliminary result)   Collection Time: 10/16/15  9:45 PM  Result Value Ref Range Status   Specimen Description LEFT ANTECUBITAL  Final   Special Requests BOTTLES DRAWN AEROBIC AND ANAEROBIC 6CC  Final   Culture NO GROWTH 2 DAYS  Final   Report Status PENDING  Incomplete  Urine culture     Status: None   Collection Time: 10/16/15 10:08 PM  Result Value Ref Range Status   Specimen Description URINE, CATHETERIZED  Final   Special Requests NONE  Final   Culture   Final    NO GROWTH 1 DAY [facility]     Report Status 10/18/2015 FINAL  Final  MRSA PCR Screening     Status: Abnormal   Collection Time: 10/17/15  1:34 AM  Result Value Ref Range Status   MRSA by PCR POSITIVE (A) NEGATIVE Final    Comment:        The GeneXpert MRSA Assay (FDA approved for NASAL specimens only), is one component of a comprehensive MRSA colonization surveillance program. It is not intended to diagnose MRSA infection nor to guide or monitor treatment for MRSA infections. RESULT CALLED TO, READ BACK BY AND VERIFIED WITH: Tommie ArdBRANDY GAVIN RN ON [id number] AT 0640 BY RESSEGGER R      Studies: No results found.  Scheduled Meds: . Chlorhexidine Gluconate Cloth  6 each Topical Q0600  . feeding supplement (ENSURE ENLIVE)  237 mL Oral BID BM  . levETIRAcetam  500 mg Oral BID  . levothyroxine  112 mcg Oral QAC breakfast  . multivitamin with minerals  1 tablet Oral Daily  . mupirocin ointment  1 application Nasal BID  . piperacillin-tazobactam (ZOSYN)  IV  3.375 g Intravenous Q8H  . senna-docusate  1 tablet Oral QHS  . sertraline  50 mg Oral Daily  . vancomycin  1,000 mg Intravenous Q12H    Continuous Infusions:    Time spent: 25mins  Vennie Salsbury MD, PhD  [facility] Pager  [phone]. If 7PM-7AM, please contact night-coverage at [email or web address], password [email or web address] 10/19/2015, 8:54 AM  LOS: 2 days

## 2015-10-19 NOTE — Progress Notes (Signed)
PT Cancellation Note  Patient Details Name: Trevor Brock MRN: [id number] DOB: 07-24-1932   Cancelled Treatment/Patient Not Seen: Received notification that staff would like patient seen before DC. However, patient not an imminent DC, or ortho/neuro, and is planned to go back to SNF regardless; spoke to rehabilitation supervisor regarding situation. Then called RN, educated regarding PT dept policy of seeing only ortho/neuro/imminient DC over weekend; also educated RN that per DPT's chart review, SNF does appear appropriate for patient at this time. RN gave verbal understanding of education this afternoon.    Nedra HaiKristen Saphyra Hutt PT, DPT [phone]

## 2015-10-20 DIAGNOSIS — R278 Other lack of coordination: Secondary | ICD-10-CM | POA: Diagnosis not present

## 2015-10-20 DIAGNOSIS — A419 Sepsis, unspecified organism: Secondary | ICD-10-CM | POA: Diagnosis not present

## 2015-10-20 DIAGNOSIS — E038 Other specified hypothyroidism: Secondary | ICD-10-CM | POA: Diagnosis not present

## 2015-10-20 DIAGNOSIS — H5702 Anisocoria: Secondary | ICD-10-CM | POA: Diagnosis not present

## 2015-10-20 DIAGNOSIS — E785 Hyperlipidemia, unspecified: Secondary | ICD-10-CM | POA: Diagnosis not present

## 2015-10-20 DIAGNOSIS — M6281 Muscle weakness (generalized): Secondary | ICD-10-CM | POA: Diagnosis not present

## 2015-10-20 DIAGNOSIS — Z9181 History of falling: Secondary | ICD-10-CM | POA: Diagnosis not present

## 2015-10-20 DIAGNOSIS — L0231 Cutaneous abscess of buttock: Secondary | ICD-10-CM | POA: Diagnosis not present

## 2015-10-20 DIAGNOSIS — G9341 Metabolic encephalopathy: Secondary | ICD-10-CM

## 2015-10-20 DIAGNOSIS — E039 Hypothyroidism, unspecified: Secondary | ICD-10-CM | POA: Diagnosis not present

## 2015-10-20 DIAGNOSIS — L0390 Cellulitis, unspecified: Secondary | ICD-10-CM | POA: Diagnosis not present

## 2015-10-20 DIAGNOSIS — F0390 Unspecified dementia without behavioral disturbance: Secondary | ICD-10-CM | POA: Diagnosis not present

## 2015-10-20 DIAGNOSIS — R262 Difficulty in walking, not elsewhere classified: Secondary | ICD-10-CM | POA: Diagnosis not present

## 2015-10-20 DIAGNOSIS — R1312 Dysphagia, oropharyngeal phase: Secondary | ICD-10-CM | POA: Diagnosis not present

## 2015-10-20 DIAGNOSIS — R55 Syncope and collapse: Secondary | ICD-10-CM | POA: Diagnosis not present

## 2015-10-20 LAB — GLUCOSE, CAPILLARY: Glucose-Capillary: 82 mg/dL (ref 65–99)

## 2015-10-20 LAB — LEVETIRACETAM LEVEL: Levetiracetam Lvl: 7.6 ug/mL — ABNORMAL LOW (ref 10.0–40.0)

## 2015-10-20 MED ORDER — FERROUS SULFATE 325 (65 FE) MG PO TABS: 325.0000 mg | ORAL_TABLET | ORAL | Status: DC

## 2015-10-20 MED ORDER — DOXYCYCLINE HYCLATE 100 MG PO CAPS: 100.0000 mg | ORAL_CAPSULE | Freq: Two times a day (BID) | ORAL | Status: DC

## 2015-10-20 MED ORDER — ENSURE ENLIVE PO LIQD: 237.0000 mL | Freq: Two times a day (BID) | ORAL | Status: DC

## 2015-10-20 MED ORDER — CHLORHEXIDINE GLUCONATE CLOTH 2 % EX PADS: 6.0000 | MEDICATED_PAD | Freq: Every day | CUTANEOUS | Status: DC

## 2015-10-20 MED ORDER — SENNOSIDES-DOCUSATE SODIUM 8.6-50 MG PO TABS: 1.0000 | ORAL_TABLET | Freq: Every day | ORAL | Status: DC

## 2015-10-20 MED ORDER — MUPIROCIN 2 % EX OINT: 1.0000 "application " | TOPICAL_OINTMENT | Freq: Two times a day (BID) | CUTANEOUS | Status: DC

## 2015-10-20 NOTE — Progress Notes (Signed)
Pharmacy Antibiotic Note  Trevor Brock is a 82 y.o. male admitted on 10/16/2015 with cellulitis.  Pharmacy has been consulted for Vancomycin and zosyn dosing. Afebrile.  WBC normal.  Urine cx (-).  Blood cx's pending.  MRSA PCR (+)  Plan: Vancomycin 1000mg  IV every 12 hours.  Goal trough 10-15 mcg/mL. Zosyn 3.375g IV q8h (4 hour infusion).   Consider switch to PO ABX (Doxycycline)  If Vanc continues, will check trough level  Monitor labs, renal fxn, progress and c/s  Height: 5\' 10"  (177.8 cm) Weight: 194 lb 0.1 oz (88 kg) IBW/kg (Calculated) : 73  Temp (24hrs), Avg:98.1 F (36.7 C), Min:97.6 F (36.4 C), Max:98.7 F (37.1 C)   Recent Labs Lab 10/16/15 1200 10/16/15 2053 10/16/15 2146 10/17/15 0332 10/18/15 0624 10/19/15 0628  WBC 9.1 10.4  --   --  7.5 5.8  CREATININE 0.82 0.83  --  0.72 0.82 0.92  LATICACIDVEN  --  2.2* 2.84* 1.1  --   --     Estimated Creatinine Clearance: 71.6 mL/min (by C-G formula based on Cr of 0.92).    Allergies  Allergen Reactions  . Statins     Possible cause of weakness   Antimicrobials this admission: Vancomycin 4/20 >>  Zosyn 4/20 >>   Microbiology results: 4/20 BCx: pending 4/20MRSA PCR: positive  Thank you for allowing pharmacy to be a part of this patient's care.  Valrie HartScott Argus Caraher, PharmD Clinical Pharmacist  10/20/2015 10:04 AM

## 2015-10-20 NOTE — Discharge Summary (Addendum)
Discharge Summary  Trevor Brock WUJ:[id number] DOB: June 15, 1933  PCP: Marga MelnickWilliam Hopper, MD  Admit date: 10/16/2015 Discharge date: 10/20/2015  Time spent: >6330mins  Recommendations for Outpatient Follow-up:  1. F/u with PMD within a week  for hospital discharge follow up, repeat cbc/bmp at follow up 2. Continue wound care  Dressing procedure/placement/frequency:Cleanse wound to left buttocks with NS. Gently fill wound bed with Iodoform packing strip. Cover with ABD/tape. Change daily.  Keep skin folds clean and dry.  Dermatherapy pillowcase to skin folds to wick moisture.  Can keep on condom cath to facilitate wound healing  Discharge Diagnoses:  Active Hospital Problems   Diagnosis Date Noted  . Sepsis (HCC) 10/16/2015  . Cellulitis and abscess 10/17/2015  . Thrombocytopenia (HCC) 10/17/2015  . Depression 10/17/2015  . Sepsis, unspecified organism (HCC) 10/17/2015  . Malnutrition of moderate degree 10/17/2015  . Abscess   . Dementia 09/28/2015  . Hypothyroid     Resolved Hospital Problems   Diagnosis Date Noted Date Resolved  No resolved problems to display.    Discharge Condition: stable  Diet recommendation: heart healthy  Filed Weights   10/17/15 0147 10/18/15 0614 10/20/15 0421  Weight: 88.043 kg (194 lb 1.6 oz) 87.499 kg (192 lb 14.4 oz) 88 kg (194 lb 0.1 oz)    History of present illness:  Trevor Readinghomas G Mccole is a 82 y.o. male with medical history significant for dementia, depression, and hypothyroidism who presents to the ED with acute encephalopathy and recurrent falls. The patient's health has been declining over recent months and he had an admission earlier this month following an apparent syncopal episode that was thought to possibly have been a first seizure. Patient was discharged to a SNF with Keppra. He has continued to struggle at the SNF, falling with increasing frequency. He was evaluated with head CT earlier today for follow and there were no acute  intracranial abnormalities identified. Upon his return to the SNF however, his family noted him to be "acting differently" and he was returned to the ED for further evaluation of this.  ED Course: Upon arrival to the ED, patient is found to be febrile to 39.2 C, saturating well on room air, and with vitals otherwise stable. Head CT was repeated and again negative for acute intracranial abnormality. Chest x-ray was negative for acute cardiopulmonary disease. CMP is notable for mild elevation in total bilirubin. CBC features a thrombocytopenia to 68,000 and lactic acid is elevated to 2.84. Urine is obtained for analysis and grossly negative for infection. An abscess with surrounding cellulitis was noted at the left buttock and this was incised and drained by the EDP. Blood and urine cultures were obtained in the emergency department, the patient was bolused with 30 cc/kg NS, and empiric antibiotics were initiated with vancomycin and Zosyn. Patient remained hemodynamically stable in the emergency department and will be admitted to the hospital for ongoing evaluation and management of acute encephalopathy with sepsis, suspected secondary to cellulitis with abscess.  Hospital Course:  Principal Problem:   Sepsis (HCC) Active Problems:   Hypothyroid   Dementia   Cellulitis and abscess   Thrombocytopenia (HCC)   Depression   Sepsis, unspecified organism (HCC)   Malnutrition of moderate degree   Abscess  Sepsis presented on admission with fever, metabolic encephalopathy, abscess left buttock, lactic acidosis. Treated with vanc/zosyn/ivf, better. discharge  To SNF on 4/23  Cellulitis/Abscess: s/p I/d in the ED.  Wound care input appreciated: Cleanse wound to left buttocks with NS. Gently fill  wound bed with Iodoform packing strip. Cover with ABD/tape. Change daily.  Keep skin folds clean and dry.  Dermatherapy pillowcase to skin folds to wick moisture.   received vanc/zosyn in the  hospital,  change to doxycycline  Bid x 10 days at discharge to finish abx treatment  Thrombocytopenia: seems to be chronic  Hypothyroidism: on synthroid. tsh wnl 09/2015  Dementia/depression: baseline oriented to person only  FTT/falls: progressive weakness for the last few months, Return to rehab at discharge. Fall precaution.  MRSA colonization: decolonization protocol in place with chlorhexidine cloth and nasal bactroban oinement for total of 5 days.   Code Status: full  Family Communication: patient and multiple family members in the room  Disposition Plan: return to snf  on 4/23   Consultants:  Wound care  Procedures:  I/d left buttock abscess on 4/19 in the ED  Antibiotics:  Vanc/zosyn  Discharge Exam: BP 127/48 mmHg  Pulse 54  Temp(Src) 98.1 F (36.7 C) (Oral)  Resp 20  Ht  (1.778 m)  Wt 88 kg (194 lb 0.1 oz)  BMI 27.84 kg/m2  SpO2 100%  General: now fully alert, pleasant, following commands, per family he is at baseline, oriented to person only Cardiovascular: RRR Respiratory: CTABL Skin: left buttock s/p i/d packing in place.  Discharge Instructions You were cared for by a hospitalist during your hospital stay. If you have any questions about your discharge medications or the care you received while you were in the hospital after you are discharged, you can call the unit and asked to speak with the hospitalist on call if the hospitalist that took care of you is not available. Once you are discharged, your primary care physician will handle any further medical issues. Please note that NO REFILLS for any discharge medications will be authorized once you are discharged, as it is imperative that you return to your primary care physician (or establish a relationship with a primary care physician if you do not have one) for your aftercare needs so that they can reassess your need for medications and monitor your lab values.      Discharge Instructions      Diet - low sodium heart healthy    Complete by:  As directed      Increase activity slowly    Complete by:  As directed             Medication List    STOP taking these medications        docusate sodium 100 MG capsule  Commonly known as:  COLACE      TAKE these medications        Chlorhexidine Gluconate Cloth 2 % Pads  Apply 6 each topically daily at 6 (six) AM. For two more days     doxycycline 100 MG capsule  Commonly known as:  VIBRAMYCIN  Take 1 capsule (100 mg total) by mouth 2 (two) times daily.     feeding supplement (ENSURE ENLIVE) Liqd  Take 237 mLs by mouth 2 (two) times daily between meals.     ferrous sulfate 325 (65 FE) MG tablet  Take 1 tablet (325 mg total) by mouth every other day.     levETIRAcetam 500 MG tablet  Commonly known as:  KEPPRA  Take 1 tablet (500 mg total) by mouth 2 (two) times daily.     levothyroxine 112 MCG tablet  Commonly known as:  SYNTHROID, LEVOTHROID  Take one tablet by mouth 30 minutes before breakfast once  daily for thyroid     mupirocin ointment 2 %  Commonly known as:  BACTROBAN  Place 1 application into the nose 2 (two) times daily. For two more days     senna-docusate 8.6-50 MG tablet  Commonly known as:  Senokot-S  Take 1 tablet by mouth at bedtime.     sertraline 50 MG tablet  Commonly known as:  ZOLOFT  Take 1 tablet (50 mg total) by mouth daily.     UNABLE TO FIND  Med Name: Magic Cup twice daily     zinc oxide 11.3 % Crea cream  Commonly known as:  BALMEX  Apply 1 application topically daily. Apply every shift and as needed(to sacrum and bilateral buttocks)       Allergies  Allergen Reactions  . Statins     Possible cause of weakness      The results of significant diagnostics from this hospitalization (including imaging, microbiology, ancillary and laboratory) are listed below for reference.    Significant Diagnostic Studies: Dg Chest 2 View  10/16/2015  CLINICAL DATA:  Worsening confusion.  EXAM: CHEST  2 VIEW COMPARISON:  09/28/2015 FINDINGS: Heart size is normal. The aorta shows atherosclerosis. Lungs are clear. No effusions. No bony abnormality. IMPRESSION: No active cardiopulmonary disease. Electronically Signed   By: Paulina Fusi M.D.   On: 10/16/2015 23:10   Ct Head Wo Contrast  10/16/2015  CLINICAL DATA:  Altered mental status today, with increasing confusion since the prior CT scan from earlier today. EXAM: CT HEAD WITHOUT CONTRAST TECHNIQUE: Contiguous axial images were obtained from the base of the skull through the vertex without intravenous contrast. COMPARISON:  10/16/2015 at 11:18 a.m. FINDINGS: The brainstem, cerebellum, cerebral peduncles, thalami, basal ganglia, basilar cisterns, and ventricular system appear within normal limits. Periventricular white matter and corona radiata hypodensities favor chronic ischemic microvascular white matter disease. No intracranial hemorrhage, mass lesion, or acute CVA. Mild scalp hematoma above the right ear. No underlying calvarial fracture. Mild chronic ethmoid sinusitis. IMPRESSION: 1. Right scalp hematoma, stable.  No acute intracranial findings. 2. Periventricular white matter and corona radiata hypodensities favor chronic ischemic microvascular white matter disease. 3. Mild chronic ethmoid sinusitis. Electronically Signed   By: Gaylyn Rong M.D.   On: 10/16/2015 21:36   Ct Head Wo Contrast  09/28/2015  CLINICAL DATA:  Code stroke, left-sided weakness starting at 1630 hours, fall, laceration posterior head, seizure, vomiting EXAM: CT HEAD WITHOUT CONTRAST TECHNIQUE: Contiguous axial images were obtained from the base of the skull through the vertex without intravenous contrast. COMPARISON:  None. FINDINGS: Brain: There is mild generalized brain atrophy with commensurate dilatation of the ventricles and sulci. There is no mass, hemorrhage, edema or other evidence of acute parenchymal abnormality. Minimal chronic small vessel ischemic  change noted within the deep periventricular white matter regions bilaterally. Vascular: No hyperdense vessel or unexpected calcification. Skull: Focal soft tissue edema/hematoma overlying the right frontal bone. No underlying fracture. Sinuses/Orbits: Visualized upper paranasal sinuses are clear. Mastoid air cells are clear. Other: None. IMPRESSION: 1. No acute intracranial abnormality. No intracranial mass, hemorrhage or edema. 2. Soft tissue edema/hematoma overlying the right frontal bone. No underlying fracture. These results were called by telephone at the time of interpretation on 09/28/2015 at 5:16 pm to Dr. Samuel Jester , who verbally acknowledged these results. Electronically Signed   By: Bary Richard M.D.   On: 09/28/2015 17:17   Ct Head W Wo Contrast  10/16/2015  CLINICAL DATA:  Larey Seat from bed today.  Scalp hematoma.  Anisocoria. EXAM: CT HEAD WITHOUT AND WITH CONTRAST TECHNIQUE: Contiguous axial images were obtained from the base of the skull through the vertex without and with intravenous contrast CONTRAST:  75mL ISOVUE-300 IOPAMIDOL (ISOVUE-300) INJECTION 61% COMPARISON:  MRI 09/29/2015 FINDINGS: Mild atrophy.  Negative for hydrocephalus. Negative for acute infarct. Mild chronic microvascular ischemic change in the white matter. Negative for hemorrhage or mass. Postcontrast imaging demonstrates normal enhancement. No enhancing mass lesion. Vascular enhancement normal. Right parietal scalp hematoma. Negative for skull fracture. Paranasal sinuses clear. IMPRESSION: Atrophy and mild chronic microvascular ischemic change. No acute intracranial abnormality. Right parietal scalp hematoma. Electronically Signed   By: Marlan Palau M.D.   On: 10/16/2015 11:52   Mr Laqueta Jean WU Contrast  09/29/2015  CLINICAL DATA:  Syncopal episode at 4 p.m., fell in bathroom with seizure like symptoms. History of dementia, hypothyroidism and hyperlipidemia. EXAM: MRI HEAD WITHOUT AND WITH CONTRAST TECHNIQUE: Multiplanar,  multiecho pulse sequences of the brain and surrounding structures were obtained without and with intravenous contrast. CONTRAST:  20mL MULTIHANCE GADOBENATE DIMEGLUMINE 529 MG/ML IV SOLN COMPARISON:  CT head September 28, 2015 FINDINGS: The ventricles and sulci are normal for patient's age. No abnormal parenchymal signal, mass lesions, mass effect. Mild patchy supratentorial white matter FLAIR T2 hyperintensities. Old small RIGHT cerebellar infarcts. No abnormal parenchymal enhancement. No reduced diffusion to suggest acute ischemia. No susceptibility artifact to suggest hemorrhage. Symmetric size, morphology and signal of the hippocampi. No abnormal extra-axial fluid collections. No extra-axial masses nor leptomeningeal enhancement. Normal major intracranial vascular flow voids seen at the skull base. Status post RIGHT ocular globe scleral banding. No suspicious calvarial bone marrow signal. No abnormal sellar expansion. Craniocervical junction maintained. Visualized paranasal sinuses and mastoid air cells are well-aerated. Scalp soft tissue swelling. IMPRESSION: No acute intracranial process. Mild chronic small vessel ischemic disease and old small RIGHT cerebellar infarcts. Scalp soft tissue swelling. Electronically Signed   By: Awilda Metro M.D.   On: 09/29/2015 06:03   US Abdomen Limited  10/17/2015  CLINICAL DATA:  Sepsis.  Elevated bilirubin.  Cholecystectomy. EXAM: US ABDOMEN LIMITED - RIGHT UPPER QUADRANT COMPARISON:  06/26/2004 CT abdomen/pelvis. FINDINGS: Gallbladder: No gallstones or wall thickening visualized. No sonographic Murphy sign noted by sonographer. Common bile duct: Diameter: 6 mm, within normal post cholecystectomy limits. No intrahepatic biliary ductal dilatation detected. Liver: No liver masses. Liver parenchymal echogenicity and echotexture are within normal limits. IMPRESSION: 1. Cholecystectomy. Common bile duct diameter 6 mm is within normal post cholecystectomy limits. 2. Normal  liver. Electronically Signed   By: Delbert Phenix M.D.   On: 10/17/2015 11:07   Dg Chest Port 1 View  09/28/2015  CLINICAL DATA:  Syncope, fall at 4 p.m. today with nausea vomiting since then, generalized weakness EXAM: PORTABLE CHEST 1 VIEW COMPARISON:  None. FINDINGS: Mild cardiac silhouette enlargement. Vascular pattern normal. Lungs clear. No consolidation effusion or pneumothorax. IMPRESSION: Mild cardiac enlargement otherwise no acute findings Electronically Signed   By: Esperanza Heir M.D.   On: 09/28/2015 17:31    Microbiology: Recent Results (from the past 240 hour(s))  Culture, blood (routine x 2)     Status: None (Preliminary result)   Collection Time: 10/16/15  9:30 PM  Result Value Ref Range Status   Specimen Description LEFT ANTECUBITAL  Final   Special Requests BOTTLES DRAWN AEROBIC AND ANAEROBIC 6CC  Final   Culture NO GROWTH 2 DAYS  Final   Report Status PENDING  Incomplete  Culture, blood (routine x  2)     Status: None (Preliminary result)   Collection Time: 10/16/15  9:45 PM  Result Value Ref Range Status   Specimen Description LEFT ANTECUBITAL  Final   Special Requests BOTTLES DRAWN AEROBIC AND ANAEROBIC 6CC  Final   Culture NO GROWTH 2 DAYS  Final   Report Status PENDING  Incomplete  Urine culture     Status: None   Collection Time: 10/16/15 10:08 PM  Result Value Ref Range Status   Specimen Description URINE, CATHETERIZED  Final   Special Requests NONE  Final   Culture   Final    NO GROWTH 1 DAY Performed at [facility]    Report Status 10/18/2015 FINAL  Final  MRSA PCR Screening     Status: Abnormal   Collection Time: 10/17/15  1:34 AM  Result Value Ref Range Status   MRSA by PCR POSITIVE (A) NEGATIVE Final    Comment:        The GeneXpert MRSA Assay (FDA approved for NASAL specimens only), is one component of a comprehensive MRSA colonization surveillance program. It is not intended to diagnose MRSA infection nor to guide or monitor treatment  for MRSA infections. RESULT CALLED TO, READ BACK BY AND VERIFIED WITH: Tommie Ard RN ON [id number] AT 0640 BY RESSEGGER R      Labs: Basic Metabolic Panel:  Recent Labs Lab 10/16/15 1200 10/16/15 2053 10/17/15 0332 10/18/15 0624 10/19/15 0628  NA 138 135 136 138 138  K 4.5 3.9 3.8 3.5 3.9  CL 104 103 108 108 107  CO2 22 22 21* 21* 24  GLUCOSE 102* 107* 109* 99 96  BUN 11 11 10 9 7   CREATININE 0.82 0.83 0.72 0.82 0.92  CALCIUM 9.7 9.4 8.2* 8.6* 8.6*  MG  --   --   --  1.7 1.9   Liver Function Tests:  Recent Labs Lab 10/16/15 2053 10/18/15 0624 10/19/15 0628  AST 51* 29 28  ALT 33 23 23  ALKPHOS 216* 156* 176*  BILITOT 2.0* 1.9* 1.1  PROT 7.4 5.7* 5.8*  ALBUMIN 4.0 2.9* 2.9*   No results for input(s): LIPASE, AMYLASE in the last 168 hours. No results for input(s): AMMONIA in the last 168 hours. CBC:  Recent Labs Lab 10/16/15 1200 10/16/15 2053 10/17/15 0332 10/18/15 0624 10/19/15 0628  WBC 9.1 10.4  --  7.5 5.8  NEUTROABS 6.1 7.8*  --   --   --   HGB 15.5 14.1  --  12.7* 12.5*  HCT 47.6 43.7 39.5 37.9* 37.5*  MCV 95.4 95.0  --  94.0 93.3  PLT 70* 68*  --  60* 68*   Cardiac Enzymes: No results for input(s): CKTOTAL, CKMB, CKMBINDEX, TROPONINI in the last 168 hours. BNP: BNP (last 3 results) No results for input(s): BNP in the last 8760 hours.  ProBNP (last 3 results) No results for input(s): PROBNP in the last 8760 hours.  CBG:  Recent Labs Lab 10/17/15 0805 10/19/15 0756 10/19/15 2042 10/20/15 0811  GLUCAP 103* 87 89 82       Signed:  Ludy Messamore MD, PhD  [facility] 10/20/2015, 12:50 PM

## 2015-10-20 NOTE — Progress Notes (Signed)
I spoke w/Linne Rierson at the [facility] to notify her of patient's discharge.  I told her that the last IV antibiotics were started at 13:30 and will run for 4 hours.  She stated that they have held Trevor Brock's bed and will be able to re-admit him when antibiotics are finished.  She also stated that she has access to d/c summary in Epic.  I will call report to nurse when meds are complete.

## 2015-10-20 NOTE — Progress Notes (Signed)
Pt's VS stable, meds are complete, discharge instructions reviewed w/family and report called to Linne at the [facility].  Pt discharged via wheelchair w/NT to [facility].

## 2015-10-21 LAB — CULTURE, BLOOD (ROUTINE X 2)
CULTURE: NO GROWTH
CULTURE: NO GROWTH

## 2015-10-22 ENCOUNTER — Non-Acute Institutional Stay (SKILLED_NURSING_FACILITY): Payer: Medicare Other | Admitting: Internal Medicine

## 2015-10-22 ENCOUNTER — Encounter: Payer: Self-pay | Admitting: Internal Medicine

## 2015-10-22 DIAGNOSIS — H5702 Anisocoria: Secondary | ICD-10-CM | POA: Diagnosis not present

## 2015-10-22 DIAGNOSIS — R55 Syncope and collapse: Secondary | ICD-10-CM

## 2015-10-22 DIAGNOSIS — L0390 Cellulitis, unspecified: Secondary | ICD-10-CM

## 2015-10-22 DIAGNOSIS — A419 Sepsis, unspecified organism: Secondary | ICD-10-CM | POA: Diagnosis not present

## 2015-10-22 DIAGNOSIS — L0291 Cutaneous abscess, unspecified: Secondary | ICD-10-CM

## 2015-10-22 DIAGNOSIS — E785 Hyperlipidemia, unspecified: Secondary | ICD-10-CM

## 2015-10-22 DIAGNOSIS — Z22322 Carrier or suspected carrier of Methicillin resistant Staphylococcus aureus: Secondary | ICD-10-CM | POA: Diagnosis not present

## 2015-10-22 NOTE — Progress Notes (Signed)
PCP: Marga Melnick, MD  This is a nursing facility follow up for specific for follow up of acute issues 4/19  Interim medical record and care since last [facility] visit was updated with review of diagnostic studies and change in clinical status since last visit were documented.  Note: only family and social history pertinent to this assessment are included.  HPI: He was hospitalized 4/19-4/23/17 with a diagnosis of sepsis. He had been found on the floor at the side of his bed 4/19. The etiology of the fall was unclear. Because of a  right temporal hematoma and anisocoria he was sent for CT. This was negative for any acute process. He was sent to the ER for new mental status changes as noted by family after he returned from CT. In the emergency room he was diagnosed with R buttock cellulitis and abscess for which incision and drainage was completed;no cultures found in the chart. He was started on empiric vancomycin and Zosyn. The urine and blood cultures 2 were negative. Sepsis had been diagnosed based on elevated lactic acid level and isolated temperature. White blood count was normal. Daily cleansing and  iodoform packing for the wound was recommended He was transferred back to [facility] 4/23.   Comprehensive review of systems:  His wife feels that his falls are related to becoming imbalanced. She denies any seizure stigmata. She denies the anisocoria was not present prior to 10/16/15. She questioned whether the falls might be related to previously taking 2 statins simultaneously.   Physical exam:  Pertinent or positive findings: Pattern alopecia is present.No temporal hematoma present. Anisocoria persists. Left pupil is bigger than the right. There is some reactivity to light. Proptosis is suggested on the left as well. He has a few remaining teeth. The lower teeth have plaque. He has a grade 1/2 systolic murmur at the right base. He has a large lipoma in the supraumbilical  area. Pedal pulses are decreased but present. He is disoriented and unable to give a meaningful history.  General appearance:Adequately nourished; no acute distress , increased work of breathing is present.   Lymphatic: No lymphadenopathy about the head, neck, axilla . Eyes: No conjunctival inflammation or lid edema is present. There is no scleral icterus. Ears:  External ear exam shows no significant lesions or deformities.   Nose:  External nasal examination shows no deformity or inflammation. Nasal mucosa are pink and moist without lesions ,exudates Oral exam: lips and gums are healthy appearing.There is no oropharyngeal erythema or exudate . Neck:  No thyromegaly, masses, tenderness noted.    Heart:  Normal rate and regular rhythm. S1 and S2 normal without gallop,click, rub .  Lungs:Chest clear to auscultation without wheezes, rhonchi,rales , rubs. Abdomen:Bowel sounds are normal. Abdomen is soft and nontender with no organomegaly, hernias. GU: deferred as previously addressed. Extremities:  No cyanosis, clubbing,edema  Neurologic exam : Strength equal  in upper & lower extremities Balance,Rhomberg,finger to nose testing could not be completed due to clinical state Deep tendon reflexes are equal Skin: Warm & dry w/o tenting.   See summary under each active problem in the Problem List with associated updated therapeutic plan. I discussed his wife's concerns about his prior use of statins in relationship to the falls. She plans to take her husband home 10/25/15. As of today's exam ; I see no contraindication to this. She will consult with physical therapy and social services prior to discharge in reference to home needs and precautions to prevent  recurrent falls. He will follow-up with his primary care physician in 10-14 days. After discharge.

## 2015-10-22 NOTE — Assessment & Plan Note (Signed)
Based on current literature; statin use would be associated with increased risk and minimal benefit in context of advanced age and advanced dementia

## 2015-10-22 NOTE — Assessment & Plan Note (Signed)
MRSA precautions discussed with wife 10/22/15

## 2015-10-22 NOTE — Assessment & Plan Note (Signed)
Wife will discuss safety issues and home precautions with physical therapy; home to be assessed for mechanical y risks.  The patient will not go to the bathroom alone.

## 2015-10-22 NOTE — Assessment & Plan Note (Addendum)
10/22/15 Incision R buttocks  7 mm ;not associated with any cellulitis or abscess formation. Iodoform gauze removed.

## 2015-10-22 NOTE — Patient Instructions (Signed)
See summary under each active problem in the Problem List with associated updated therapeutic plan. Completed document transmitted to [facility]. 

## 2015-10-29 DIAGNOSIS — R55 Syncope and collapse: Secondary | ICD-10-CM | POA: Diagnosis not present

## 2015-10-29 DIAGNOSIS — R262 Difficulty in walking, not elsewhere classified: Secondary | ICD-10-CM | POA: Diagnosis not present

## 2015-10-30 DIAGNOSIS — R55 Syncope and collapse: Secondary | ICD-10-CM | POA: Diagnosis not present

## 2015-10-30 DIAGNOSIS — R262 Difficulty in walking, not elsewhere classified: Secondary | ICD-10-CM | POA: Diagnosis not present

## 2015-10-31 DIAGNOSIS — G40909 Epilepsy, unspecified, not intractable, without status epilepticus: Secondary | ICD-10-CM | POA: Diagnosis not present

## 2015-11-01 DIAGNOSIS — R55 Syncope and collapse: Secondary | ICD-10-CM | POA: Diagnosis not present

## 2015-11-01 DIAGNOSIS — R262 Difficulty in walking, not elsewhere classified: Secondary | ICD-10-CM | POA: Diagnosis not present

## 2015-11-04 DIAGNOSIS — R262 Difficulty in walking, not elsewhere classified: Secondary | ICD-10-CM | POA: Diagnosis not present

## 2015-11-04 DIAGNOSIS — R55 Syncope and collapse: Secondary | ICD-10-CM | POA: Diagnosis not present

## 2015-11-04 DIAGNOSIS — S31829A Unspecified open wound of left buttock, initial encounter: Secondary | ICD-10-CM | POA: Diagnosis not present

## 2015-11-05 DIAGNOSIS — R55 Syncope and collapse: Secondary | ICD-10-CM | POA: Diagnosis not present

## 2015-11-05 DIAGNOSIS — R262 Difficulty in walking, not elsewhere classified: Secondary | ICD-10-CM | POA: Diagnosis not present

## 2015-11-06 DIAGNOSIS — R55 Syncope and collapse: Secondary | ICD-10-CM | POA: Diagnosis not present

## 2015-11-06 DIAGNOSIS — R262 Difficulty in walking, not elsewhere classified: Secondary | ICD-10-CM | POA: Diagnosis not present

## 2015-11-07 DIAGNOSIS — R262 Difficulty in walking, not elsewhere classified: Secondary | ICD-10-CM | POA: Diagnosis not present

## 2015-11-07 DIAGNOSIS — R55 Syncope and collapse: Secondary | ICD-10-CM | POA: Diagnosis not present

## 2015-11-08 DIAGNOSIS — R55 Syncope and collapse: Secondary | ICD-10-CM | POA: Diagnosis not present

## 2015-11-08 DIAGNOSIS — R262 Difficulty in walking, not elsewhere classified: Secondary | ICD-10-CM | POA: Diagnosis not present

## 2015-11-11 DIAGNOSIS — R262 Difficulty in walking, not elsewhere classified: Secondary | ICD-10-CM | POA: Diagnosis not present

## 2015-11-11 DIAGNOSIS — R55 Syncope and collapse: Secondary | ICD-10-CM | POA: Diagnosis not present

## 2015-11-12 DIAGNOSIS — R262 Difficulty in walking, not elsewhere classified: Secondary | ICD-10-CM | POA: Diagnosis not present

## 2015-11-12 DIAGNOSIS — R55 Syncope and collapse: Secondary | ICD-10-CM | POA: Diagnosis not present

## 2015-11-13 DIAGNOSIS — R262 Difficulty in walking, not elsewhere classified: Secondary | ICD-10-CM | POA: Diagnosis not present

## 2015-11-13 DIAGNOSIS — R55 Syncope and collapse: Secondary | ICD-10-CM | POA: Diagnosis not present

## 2015-11-14 DIAGNOSIS — R55 Syncope and collapse: Secondary | ICD-10-CM | POA: Diagnosis not present

## 2015-11-14 DIAGNOSIS — R262 Difficulty in walking, not elsewhere classified: Secondary | ICD-10-CM | POA: Diagnosis not present

## 2015-11-15 DIAGNOSIS — R55 Syncope and collapse: Secondary | ICD-10-CM | POA: Diagnosis not present

## 2015-11-15 DIAGNOSIS — R262 Difficulty in walking, not elsewhere classified: Secondary | ICD-10-CM | POA: Diagnosis not present

## 2015-11-18 ENCOUNTER — Encounter: Payer: Self-pay | Admitting: Emergency Medicine

## 2015-11-19 DIAGNOSIS — R262 Difficulty in walking, not elsewhere classified: Secondary | ICD-10-CM | POA: Diagnosis not present

## 2015-11-19 DIAGNOSIS — R55 Syncope and collapse: Secondary | ICD-10-CM | POA: Diagnosis not present

## 2015-11-20 DIAGNOSIS — R262 Difficulty in walking, not elsewhere classified: Secondary | ICD-10-CM | POA: Diagnosis not present

## 2015-11-20 DIAGNOSIS — R55 Syncope and collapse: Secondary | ICD-10-CM | POA: Diagnosis not present

## 2015-11-22 DIAGNOSIS — R55 Syncope and collapse: Secondary | ICD-10-CM | POA: Diagnosis not present

## 2015-11-22 DIAGNOSIS — R262 Difficulty in walking, not elsewhere classified: Secondary | ICD-10-CM | POA: Diagnosis not present

## 2015-11-27 DIAGNOSIS — R262 Difficulty in walking, not elsewhere classified: Secondary | ICD-10-CM | POA: Diagnosis not present

## 2015-11-27 DIAGNOSIS — R55 Syncope and collapse: Secondary | ICD-10-CM | POA: Diagnosis not present

## 2015-11-29 DIAGNOSIS — E039 Hypothyroidism, unspecified: Secondary | ICD-10-CM | POA: Diagnosis not present

## 2015-11-29 DIAGNOSIS — G3184 Mild cognitive impairment, so stated: Secondary | ICD-10-CM | POA: Diagnosis not present

## 2015-11-29 DIAGNOSIS — N401 Enlarged prostate with lower urinary tract symptoms: Secondary | ICD-10-CM | POA: Diagnosis not present

## 2015-12-04 NOTE — Progress Notes (Signed)
Interval History:                                                                                                                      Trevor Brock is an 82 y.o. male patient with  with advanced dementia, presented with worsening mental status changes, and confusion. His been a concern for seizure with some convulsive type of episode noted by family. He also has a history of worsening and lately function after starting to statin medications few months ago. Statins discontinued at this time. Also recommend minimizing use of any neuropsychiatric pain medications which could cause cognitive sedative side effects. Reduce the dose of Zoloft by half.  MRI of the brain did not show any acute pathology.   EEG tomorrow showed mild to moderate encephalopathy.    Was started on empiric Keppra 500 twice a day at admission by the hospitalist service. Apparently telemetry neurology specialist on-call were consulted when the patient initially presented to the OSH emergency room who recommended starting Keppra in the ER. Physical therapy for gait and balance training.      Past Medical History: Past Medical History  Diagnosis Date  . Hyperlipidemia     Statin intolerance as leg weakness  . Depression   . Mild memory disturbance   . Assistance needed for mobility     walks with walker  . Hypothyroid     Past Surgical History  Procedure Laterality Date  . Cholecystectomy    . Tonsillectomy    . Retinal detachment surgery Right     Family History: Family History  Problem Relation Age of Onset  . Cancer Mother     Diffuse metastases of unknown primary  . Cancer Sister     Breast  . Cancer Brother     Gastrointestinal  . Stroke Neg Hx   . Diabetes Neg Hx   . Heart disease Neg Hx     Social History:   reports that he has never smoked. He does not have any smokeless tobacco history on file. He reports that he does not drink alcohol or use illicit drugs.  Allergies:  Allergies   Allergen Reactions  . Statins     Possible cause of weakness     Medications:                                                                                                                        No current facility-administered medications for this encounter.  Current outpatient prescriptions:  .  levothyroxine (SYNTHROID, LEVOTHROID) 112 MCG tablet, Take one tablet by mouth 30 minutes before breakfast once daily for thyroid, Disp: , Rfl: 0 .  Chlorhexidine Gluconate Cloth 2 % PADS, Apply 6 each topically daily at 6 (six) AM. For two more days, Disp: 12 each, Rfl: 0 .  doxycycline (VIBRAMYCIN) 100 MG capsule, Take 1 capsule (100 mg total) by mouth 2 (two) times daily., Disp: 20 capsule, Rfl: 0 .  feeding supplement, ENSURE ENLIVE, (ENSURE ENLIVE) LIQD, Take 237 mLs by mouth 2 (two) times daily between meals., Disp: 237 mL, Rfl: 12 .  ferrous sulfate 325 (65 FE) MG tablet, Take 1 tablet (325 mg total) by mouth every other day., Disp: 30 tablet, Rfl: 0 .  levETIRAcetam (KEPPRA) 500 MG tablet, Take 1 tablet (500 mg total) by mouth 2 (two) times daily., Disp: 60 tablet, Rfl: 0 .  mupirocin ointment (BACTROBAN) 2 %, Place 1 application into the nose 2 (two) times daily. For two more days, Disp: 22 g, Rfl: 0 .  senna-docusate (SENOKOT-S) 8.6-50 MG tablet, Take 1 tablet by mouth at bedtime., Disp: 10 tablet, Rfl: 0 .  sertraline (ZOLOFT) 50 MG tablet, Take 1 tablet (50 mg total) by mouth daily., Disp: 30 tablet, Rfl: 0 .  UNABLE TO FIND, Med Name: Magic Cup twice daily, Disp: , Rfl:  .  zinc oxide (BALMEX) 11.3 % CREA cream, Apply 1 application topically daily. Apply every shift and as needed(to sacrum and bilateral buttocks), Disp: , Rfl:    Neurologic Examination:                                                                                                     Today's Vitals   10/02/15 1549 10/02/15 1940 10/02/15 2157 10/03/15 0453  BP: 144/58  137/58 115/45  Pulse: 70  70 74  Temp:  97.9 F (36.6 C)  97.3 F (36.3 C) 98.1 F (36.7 C)  TempSrc: Oral  Oral Oral  Resp: 18  20 18   Height:      Weight:      SpO2: 97%  96% 94%  PainSc:  0-No pain      Evaluation of higher integrative functions including: Level of alertness: Alert,  Speech: fluent, no evidence of dysarthria or aphasia noted.  Test the following cranial nerves: 2-12 grossly intact Motor examination: Normal tone, bulk, full 5/5 motor strength in all 4 extremities    Lab Results: Basic Metabolic Panel: No results for input(s): NA, K, CL, CO2, GLUCOSE, BUN, CREATININE, CALCIUM, MG, PHOS in the last 168 hours.  Liver Function Tests: No results for input(s): AST, ALT, ALKPHOS, BILITOT, PROT, ALBUMIN in the last 168 hours. No results for input(s): LIPASE, AMYLASE in the last 168 hours. No results for input(s): AMMONIA in the last 168 hours.  CBC: No results for input(s): WBC, NEUTROABS, HGB, HCT, MCV, PLT in the last 168 hours.  Cardiac Enzymes: No results for input(s): CKTOTAL, CKMB, CKMBINDEX, TROPONINI in the last 168 hours.  Lipid Panel: No results for input(s): CHOL, TRIG, HDL, CHOLHDL, VLDL,  LDLCALC in the last 168 hours.  CBG: No results for input(s): GLUCAP in the last 168 hours.  Microbiology: No results found for this or any previous visit.  Imaging: No results found.  Assessment and plan:   Trevor Brock is an 82 y.o. male patient with  with advanced dementia, presented with worsening mental status changes, and confusion. His been a concern for seizure with some convulsive type of episode noted by family. He also has a history of worsening and lately function after starting to statin medications few months ago. Statins discontinued at this time. Also recommend minimizing use of any neuropsychiatric pain medications which could cause cognitive sedative side effects. Reduced the dose of Zoloft by half.  MRI of the brain did not show any acute pathology.   EEG tomorrow showed  mild to moderate encephalopathy.    Was started on empiric Keppra 500 twice a day at admission by the hospitalist service. Apparently telemetry neurology specialist on-call were consulted when the patient initially presented to the OSH emergency room who recommended starting Keppra in the ER. Physical therapy for gait and balance training.   Continue keppra 500 mg BID.   We'll follow-up PRN.

## 2016-03-09 DIAGNOSIS — E782 Mixed hyperlipidemia: Secondary | ICD-10-CM | POA: Diagnosis not present

## 2016-03-09 DIAGNOSIS — D509 Iron deficiency anemia, unspecified: Secondary | ICD-10-CM | POA: Diagnosis not present

## 2016-03-09 DIAGNOSIS — E039 Hypothyroidism, unspecified: Secondary | ICD-10-CM | POA: Diagnosis not present

## 2016-03-11 DIAGNOSIS — E782 Mixed hyperlipidemia: Secondary | ICD-10-CM | POA: Diagnosis not present

## 2016-03-11 DIAGNOSIS — D696 Thrombocytopenia, unspecified: Secondary | ICD-10-CM | POA: Diagnosis not present

## 2016-03-11 DIAGNOSIS — Z0001 Encounter for general adult medical examination with abnormal findings: Secondary | ICD-10-CM | POA: Diagnosis not present

## 2016-03-11 DIAGNOSIS — G3184 Mild cognitive impairment, so stated: Secondary | ICD-10-CM | POA: Diagnosis not present

## 2016-03-11 DIAGNOSIS — E039 Hypothyroidism, unspecified: Secondary | ICD-10-CM | POA: Diagnosis not present

## 2016-06-09 DIAGNOSIS — E46 Unspecified protein-calorie malnutrition: Secondary | ICD-10-CM | POA: Diagnosis not present

## 2016-09-28 DIAGNOSIS — D509 Iron deficiency anemia, unspecified: Secondary | ICD-10-CM | POA: Diagnosis not present

## 2016-09-28 DIAGNOSIS — E039 Hypothyroidism, unspecified: Secondary | ICD-10-CM | POA: Diagnosis not present

## 2016-09-28 DIAGNOSIS — E782 Mixed hyperlipidemia: Secondary | ICD-10-CM | POA: Diagnosis not present

## 2016-09-30 DIAGNOSIS — Z6824 Body mass index (BMI) 24.0-24.9, adult: Secondary | ICD-10-CM | POA: Diagnosis not present

## 2016-09-30 DIAGNOSIS — E441 Mild protein-calorie malnutrition: Secondary | ICD-10-CM | POA: Diagnosis not present

## 2016-10-01 ENCOUNTER — Other Ambulatory Visit: Payer: Self-pay

## 2016-10-01 NOTE — Patient Outreach (Signed)
[facility]) Care Management  10/01/2016  ABDIAZIZ KLAHN 03/30/1933 [id number]     Telephone Screen  Referral Date: 10/01/16 Referral Source: MD office(Dr. Dwana Melena) Referral Reason: "end stage dementia, malnutrition, need resources for in home caregiver"    Outreach attempt # 1 to patient. Screening   Social: Spoke with spouse and spouse reported that patient unable to adequately communicate and conversate. She states that patient only able to speak a few words and throughout the course of the day may say "fifteen words total."She states that patient is still able to ambulate short distances with walker. She reports no recent falls. DME in the home include walker. Spouse states she is primarily sole caregiver for patient but voices that she has several medical issues of her own including RA which limits how much she can help physically. Spouse reports that patient unable to perform his ADLs/IADLs and requires assistance with all of his personal care except he is abel to feed himself. She reports that she has two children that live [place] but work and not able to help out as much. SH estates she has a son in [place] that comes to visit weekly to assist her with caring for patient. She relies on her children to take them back and forth to medical appts. Spouse reports that it is difficult to get patient in/out of home due to steps. She states that they are trying to find assistance with getting ramp built.  Conditions: Patient has PMH per notes of "end stage dementia", depression, HLD, malnutrition and benign prostatic hyperplasia.  Spouse reports that patient's dementia continues to worsen. She reports that his appetite is fair and he does like to drink nutritional supplements daily. However, spouse states he has continued to lose weight. She reports a nine pound unintentional weight loss within the past month.  Spouse reported that a "nurse named Waynetta Sandy" was coming out to  their home on next Tuesday(10/06/16) to assess patient but spouse did not know what agency nurse was with. RN CM reviewed faxed over notes from MD office and determined that its possibly nurse with hospice/palliative care. Spouse states she vaguely remembers MD discussing referral for hospice/palliative. She states she does not feel that patient is "near end of life" and needs hospice but she will go along with scheduled appt. She states that one of her sons plan to be there for visit as well. Spouse became tearful during conversation as she shares how patient has declined but she feels that she still "have so more years with him." emotional support given to spouse.  Medications: Spouse states that patient only takes two meds- thyroid med and depression med.Denies any issues affording and/or managing meds.   Appointments: Patient is only followed by PCP and saw him on yesterday.   Advance Directives: None. Patient unable to complete due to mental status.   Consent: [facility] services reviewed and discussed. Advised spouse that [facility] does not follow hospice patients. Discussed hospice services and what they can provide and what services [facility] can provide. Spouse unsure if she ready for hospice. Advised spouse that RN CM would call back after nurse visits home to determine which services and route family would like to proceed with. Spouse is agreeable to [facility] SW and community nurse referral if hospice not selected. She verbalized understanding and was appreciative of call, information and support given.    Plan: RN CM will contact spouse back next week to assess if hospice/palliative care services selected.  RN CM will f/u with PCP once family makes decision on goals of care and services desired.  Antionette Fairy, RN,BSN,CCM [facility] Care Management Telephonic Care Management Coordinator Direct Phone: [phone] Toll Free: [phone] Fax: [phone]

## 2016-10-07 ENCOUNTER — Other Ambulatory Visit: Payer: Self-pay

## 2016-10-07 NOTE — Patient Outreach (Signed)
[facility]) Care Management  10/07/2016  Trevor Brock March 23, 1933 [id number]   Telephone Screen  Referral Date: 10/01/16 Referral Source: MD office(Dr. Dwana Melena) Referral Reason: "end stage dementia, malnutrition, need resources for in home caregiver"   Outreach attempt to spouse to follow up with her from previous call. Spouse states that hospice did come out on yesterday. They enrolled patient into their services. Spouse states that patient will be getting RN,SW and aide services at this time. She states that they are also working to get DME in the home to better assist and provide care to patient. Spouse states that she feels this was the best route to go with and she is pleased with the services and support thus far. Advised spouse that [facility] does not follow hospice patients and will close referral at this time. Spouse voiced understanding and was appreciative of f/u call. She is aware to contact hospice 24/7 for any needs or concerns regarding patient.    Plan: RN CM will notify [facility] administrative assistant of case status. RN CM will send MD case closure letter.    Antionette Fairy, RN,BSN,CCM [facility] Care Management Telephonic Care Management Coordinator Direct Phone: [phone] Toll Free: [phone] Fax: [phone]

## 2016-11-10 ENCOUNTER — Encounter (HOSPITAL_COMMUNITY): Payer: Self-pay | Admitting: *Deleted

## 2016-11-10 ENCOUNTER — Emergency Department (HOSPITAL_COMMUNITY): Payer: Medicare Other

## 2016-11-10 ENCOUNTER — Inpatient Hospital Stay (HOSPITAL_COMMUNITY)
Admission: EM | Admit: 2016-11-10 | Discharge: 2016-11-13 | DRG: 603 | Disposition: A | Discharge status: Home / Self Care | Payer: Medicare Other | Attending: Internal Medicine | Admitting: Internal Medicine

## 2016-11-10 DIAGNOSIS — Z888 Allergy status to other drugs, medicaments and biological substances status: Secondary | ICD-10-CM | POA: Diagnosis not present

## 2016-11-10 DIAGNOSIS — F329 Major depressive disorder, single episode, unspecified: Secondary | ICD-10-CM | POA: Diagnosis present

## 2016-11-10 DIAGNOSIS — Z808 Family history of malignant neoplasm of other organs or systems: Secondary | ICD-10-CM

## 2016-11-10 DIAGNOSIS — Z9049 Acquired absence of other specified parts of digestive tract: Secondary | ICD-10-CM

## 2016-11-10 DIAGNOSIS — F0390 Unspecified dementia without behavioral disturbance: Secondary | ICD-10-CM | POA: Diagnosis present

## 2016-11-10 DIAGNOSIS — Z833 Family history of diabetes mellitus: Secondary | ICD-10-CM

## 2016-11-10 DIAGNOSIS — R011 Cardiac murmur, unspecified: Secondary | ICD-10-CM | POA: Diagnosis present

## 2016-11-10 DIAGNOSIS — D696 Thrombocytopenia, unspecified: Secondary | ICD-10-CM | POA: Diagnosis not present

## 2016-11-10 DIAGNOSIS — E785 Hyperlipidemia, unspecified: Secondary | ICD-10-CM | POA: Diagnosis present

## 2016-11-10 DIAGNOSIS — R2681 Unsteadiness on feet: Secondary | ICD-10-CM | POA: Diagnosis not present

## 2016-11-10 DIAGNOSIS — R293 Abnormal posture: Secondary | ICD-10-CM | POA: Diagnosis not present

## 2016-11-10 DIAGNOSIS — R4182 Altered mental status, unspecified: Secondary | ICD-10-CM

## 2016-11-10 DIAGNOSIS — E039 Hypothyroidism, unspecified: Secondary | ICD-10-CM | POA: Diagnosis present

## 2016-11-10 DIAGNOSIS — Z803 Family history of malignant neoplasm of breast: Secondary | ICD-10-CM

## 2016-11-10 DIAGNOSIS — Z66 Do not resuscitate: Secondary | ICD-10-CM | POA: Diagnosis not present

## 2016-11-10 DIAGNOSIS — R531 Weakness: Secondary | ICD-10-CM

## 2016-11-10 DIAGNOSIS — R471 Dysarthria and anarthria: Secondary | ICD-10-CM | POA: Diagnosis present

## 2016-11-10 DIAGNOSIS — I639 Cerebral infarction, unspecified: Secondary | ICD-10-CM | POA: Diagnosis not present

## 2016-11-10 DIAGNOSIS — Z823 Family history of stroke: Secondary | ICD-10-CM

## 2016-11-10 DIAGNOSIS — Z800 Family history of malignant neoplasm of digestive organs: Secondary | ICD-10-CM

## 2016-11-10 DIAGNOSIS — F32A Depression, unspecified: Secondary | ICD-10-CM | POA: Diagnosis present

## 2016-11-10 DIAGNOSIS — I361 Nonrheumatic tricuspid (valve) insufficiency: Secondary | ICD-10-CM | POA: Diagnosis not present

## 2016-11-10 DIAGNOSIS — L03211 Cellulitis of face: Secondary | ICD-10-CM | POA: Diagnosis not present

## 2016-11-10 DIAGNOSIS — R1310 Dysphagia, unspecified: Secondary | ICD-10-CM | POA: Diagnosis not present

## 2016-11-10 DIAGNOSIS — M6281 Muscle weakness (generalized): Secondary | ICD-10-CM | POA: Diagnosis not present

## 2016-11-10 DIAGNOSIS — R220 Localized swelling, mass and lump, head: Secondary | ICD-10-CM | POA: Diagnosis not present

## 2016-11-10 DIAGNOSIS — Z8249 Family history of ischemic heart disease and other diseases of the circulatory system: Secondary | ICD-10-CM | POA: Diagnosis not present

## 2016-11-10 HISTORY — DX: Thrombocytopenia, unspecified: D69.6

## 2016-11-10 HISTORY — DX: Unspecified dementia, unspecified severity, without behavioral disturbance, psychotic disturbance, mood disturbance, and anxiety: F03.90

## 2016-11-10 LAB — CBC WITH DIFFERENTIAL/PLATELET
Basophils Absolute: 0.1 10*3/uL (ref 0.0–0.1)
Basophils Relative: 1 %
EOS ABS: 0.2 10*3/uL (ref 0.0–0.7)
Eosinophils Relative: 3 %
HEMATOCRIT: 36.1 % — AB (ref 39.0–52.0)
HEMOGLOBIN: 12.1 g/dL — AB (ref 13.0–17.0)
LYMPHS ABS: 1.8 10*3/uL (ref 0.7–4.0)
LYMPHS PCT: 26 %
MCH: 33.2 pg (ref 26.0–34.0)
MCHC: 33.5 g/dL (ref 30.0–36.0)
MCV: 99.2 fL (ref 78.0–100.0)
MONOS PCT: 14 %
Monocytes Absolute: 1 10*3/uL (ref 0.1–1.0)
NEUTROS PCT: 56 %
Neutro Abs: 3.7 10*3/uL (ref 1.7–7.7)
Platelets: 66 10*3/uL — ABNORMAL LOW (ref 150–400)
RBC: 3.64 MIL/uL — ABNORMAL LOW (ref 4.22–5.81)
RDW: 14.8 % (ref 11.5–15.5)
WBC: 6.7 10*3/uL (ref 4.0–10.5)

## 2016-11-10 LAB — I-STAT CG4 LACTIC ACID, ED: LACTIC ACID, VENOUS: 2.35 mmol/L — AB (ref 0.5–1.9)

## 2016-11-10 LAB — COMPREHENSIVE METABOLIC PANEL
ALK PHOS: 145 U/L — AB (ref 38–126)
ALT: 27 U/L (ref 17–63)
AST: 44 U/L — ABNORMAL HIGH (ref 15–41)
Albumin: 3.2 g/dL — ABNORMAL LOW (ref 3.5–5.0)
Anion gap: 7 (ref 5–15)
BILIRUBIN TOTAL: 1 mg/dL (ref 0.3–1.2)
BUN: 13 mg/dL (ref 6–20)
CALCIUM: 9 mg/dL (ref 8.9–10.3)
CO2: 26 mmol/L (ref 22–32)
CREATININE: 1.03 mg/dL (ref 0.61–1.24)
Chloride: 105 mmol/L (ref 101–111)
Glucose, Bld: 96 mg/dL (ref 65–99)
Potassium: 3.9 mmol/L (ref 3.5–5.1)
SODIUM: 138 mmol/L (ref 135–145)
Total Protein: 6.1 g/dL — ABNORMAL LOW (ref 6.5–8.1)

## 2016-11-10 LAB — URINALYSIS, ROUTINE W REFLEX MICROSCOPIC
Bilirubin Urine: NEGATIVE
GLUCOSE, UA: NEGATIVE mg/dL
Ketones, ur: NEGATIVE mg/dL
LEUKOCYTES UA: NEGATIVE
NITRITE: NEGATIVE
PH: 6 (ref 5.0–8.0)
Protein, ur: NEGATIVE mg/dL
SPECIFIC GRAVITY, URINE: 1.02 (ref 1.005–1.030)

## 2016-11-10 LAB — URINALYSIS, MICROSCOPIC (REFLEX)

## 2016-11-10 LAB — ETHANOL: Alcohol, Ethyl (B): <5 mg/dL (ref <5)

## 2016-11-10 LAB — TROPONIN I

## 2016-11-10 LAB — APTT: APTT: 32 s (ref 24–36)

## 2016-11-10 LAB — PROTIME-INR
INR: 1.03
Prothrombin Time: 13.5 seconds (ref 11.4–15.2)

## 2016-11-10 MED ORDER — DEXTROSE 5 % IV SOLN
1.0000 g | Freq: Once | INTRAVENOUS | Status: AC
  Administered 2016-11-10: 1 g via INTRAVENOUS
  Filled 2016-11-10: qty 10

## 2016-11-10 MED ORDER — ACETAMINOPHEN 500 MG PO TABS
1000.0000 mg | ORAL_TABLET | Freq: Once | ORAL | Status: AC
Start: 2016-11-10 — End: 2016-11-10
  Administered 2016-11-10: 1000 mg via ORAL
  Filled 2016-11-10: qty 2

## 2016-11-10 MED ORDER — SODIUM CHLORIDE 0.9 % IV SOLN
INTRAVENOUS | Status: DC
  Administered 2016-11-10: 1000 mL via INTRAVENOUS

## 2016-11-10 MED ORDER — SODIUM CHLORIDE 0.9 % IV BOLUS (SEPSIS)
1000.0000 mL | Freq: Once | INTRAVENOUS | Status: AC
  Administered 2016-11-10: 1000 mL via INTRAVENOUS

## 2016-11-10 NOTE — H&P (Signed)
History and Physical    Trevor Brock:[id number] DOB: 1933-02-06 DOA: 11/10/2016  PCP: Trevor Stabile, MD   Patient coming from: Home.  I have personally briefly reviewed patient's old medical records in [facility]  Chief Complaint: Weakness and facial swelling.  HPI: Trevor Brock is a 83 y.o. male with medical history significant of dementia, depression, hyperlipidemia, hypothyroidism, thrombocytopenia who was brought to the emergency department by family members due to weakness and left facial swelling.  Per patient's son, [name], until yesterday the patient's was walking with a walker or assistance from family members. Since around 0200 today, the patient has been a lot weaker and more confused than usual, which was noticed when his wife was helping him to get to the bathroom. Later this morning, his son took him out to have breakfast around 0800. They returned home around 1000 and he was unable to get outside of the vehicle. He was unable to walk and the patient's son had to call the patient's son-in-law to assist him getting the patient out from the vehicle and into his home. When they asked him to walk, the patient would only lift his feet, but would not move forward. Once inside the house, they also had to physically lift him to take him to the toilet. Later in the afternoon, the patient developed left facial erythema, tenderness and edema. He felt febrile to the touch and was febrile in the emergency department with a temperature of 100.56F. His son also states that he has been very anxious and fearful.   ED Course: He received a 1 L normal saline bolus and ceftriaxone 1 g IVPB. Urinalysis showed rare bacteria and trace hemoglobin. His WBC was 6.7, hemoglobin 12.1 g/dL and platelets 66. PT/INR/PTT within normal limits. Troponin level was negative. Lactic acid was 2.35, sodium 138, potassium 3.9, chloride 105 and bicarbonate 26 mmol/L. BUN 13, creatinine 1.03, ethanol  less than 5 and glucose 96 mg/dL.  Imaging: Chest radiograph did not show acute pathology. CT scan brain was negative for acute findings. Please see full radiology report for further details.  Review of Systems: As per HPI otherwise 10 point review of systems negative.    Past Medical History:  Diagnosis Date  . Assistance needed for mobility    walks with walker  . Dementia   . Depression   . Hyperlipidemia    Statin intolerance as leg weakness  . Hypothyroid   . Mild memory disturbance     Past Surgical History:  Procedure Laterality Date  . CHOLECYSTECTOMY    . RETINAL DETACHMENT SURGERY Right   . TONSILLECTOMY       reports that he has never smoked. He does not have any smokeless tobacco history on file. He reports that he does not drink alcohol or use drugs.  Allergies  Allergen Reactions  . Statins     Possible cause of weakness    Family History  Problem Relation Age of Onset  . Cancer Mother        Diffuse metastases of unknown primary  . Cancer Sister        Breast  . Cancer Brother        Gastrointestinal  . Stroke Neg Hx   . Diabetes Neg Hx   . Heart disease Neg Hx     Prior to Admission medications   Medication Sig Start Date End Date Taking? Authorizing Provider  levothyroxine (SYNTHROID, LEVOTHROID) 112 MCG tablet Take one tablet  by mouth 30 minutes before breakfast once daily for thyroid 09/15/15  Yes [provider]  sertraline (ZOLOFT) 50 MG tablet Take 1 tablet (50 mg total) by mouth daily. Patient taking differently: Take 100 mg by mouth daily.  10/03/15  Yes Dorothea Ogle, MD    Physical Exam: Vitals:   11/10/16 2030 11/10/16 2045 11/10/16 2200 11/10/16 2300  BP: (!) 124/58 (!) 128/53 118/77 (!) 127/57  Pulse: 79 77 80 83  Resp: 18 20 (!) 21 17  Temp:      TempSrc:      SpO2: 98% 98% 98% 98%  Weight:       Constitutional: Elderly, frail, but in NAD. Eyes: Positive anisocoria, right pupil about 4 mm and left pupil 6 mm, both  reactive to light. Left conjunctiva is injected, positive dry yellowish crust on left eyelid and eyelashes. ENMT: Mucous membranes are mildly dry. Posterior pharynx clear of any exudate or lesions. Neck: normal, supple, no masses, no thyromegaly Respiratory: clear to auscultation bilaterally, no wheezing, no crackles. Normal respiratory effort. No accessory muscle use.  Cardiovascular: Regular rate and rhythm, positive SEM, no rubs / gallops. No extremity edema. 2+ pedal pulses. No carotid bruits.  Abdomen: Soft, no tenderness, no masses palpated. No hepatosplenomegaly. Bowel sounds positive.  Musculoskeletal: no clubbing / cyanosis. Good ROM, no contractures. Normal muscle tone.  Skin: Left facial calor ,erythema, mild tenderness and edema. Neurologic: CN 2-12 grossly intact. Sensation intact, DTR normal. Generalized weakness.  Psychiatric: Oriented to name only.    Labs on Admission: I have personally reviewed following labs and imaging studies  CBC:  Recent Labs Lab 11/10/16 2023  WBC 6.7  NEUTROABS 3.7  HGB 12.1*  HCT 36.1*  MCV 99.2  PLT 66*   Basic Metabolic Panel:  Recent Labs Lab 11/10/16 2023  NA 138  K 3.9  CL 105  CO2 26  GLUCOSE 96  BUN 13  CREATININE 1.03  CALCIUM 9.0   GFR: CrCl cannot be calculated (Unknown ideal weight.). Liver Function Tests:  Recent Labs Lab 11/10/16 2023  AST 44*  ALT 27  ALKPHOS 145*  BILITOT 1.0  PROT 6.1*  ALBUMIN 3.2*   No results for input(s): LIPASE, AMYLASE in the last 168 hours. No results for input(s): AMMONIA in the last 168 hours. Coagulation Profile:  Recent Labs Lab 11/10/16 2022  INR 1.03   Cardiac Enzymes:  Recent Labs Lab 11/10/16 2023  TROPONINI <0.03   BNP (last 3 results) No results for input(s): PROBNP in the last 8760 hours. HbA1C: No results for input(s): HGBA1C in the last 72 hours. CBG: No results for input(s): GLUCAP in the last 168 hours. Lipid Profile: No results for input(s):  CHOL, HDL, LDLCALC, TRIG, CHOLHDL, LDLDIRECT in the last 72 hours. Thyroid Function Tests: No results for input(s): TSH, T4TOTAL, FREET4, T3FREE, THYROIDAB in the last 72 hours. Anemia Panel: No results for input(s): VITAMINB12, FOLATE, FERRITIN, TIBC, IRON, RETICCTPCT in the last 72 hours. Urine analysis:    Component Value Date/Time   COLORURINE YELLOW 11/10/2016 2009   APPEARANCEUR CLEAR 11/10/2016 2009   LABSPEC 1.020 11/10/2016 2009   PHURINE 6.0 11/10/2016 2009   GLUCOSEU NEGATIVE 11/10/2016 2009   HGBUR TRACE (A) 11/10/2016 2009   BILIRUBINUR NEGATIVE 11/10/2016 2009   KETONESUR NEGATIVE 11/10/2016 2009   PROTEINUR NEGATIVE 11/10/2016 2009   NITRITE NEGATIVE 11/10/2016 2009   LEUKOCYTESUR NEGATIVE 11/10/2016 2009    Radiological Exams on Admission: Ct Head Wo Contrast  Result Date: 11/10/2016  CLINICAL DATA:  Unable to ambulate, LEFT facial swelling and LEFT eye droop. History of dementia, hyperlipidemia. EXAM: CT HEAD WITHOUT CONTRAST TECHNIQUE: Contiguous axial images were obtained from the base of the skull through the vertex without intravenous contrast. COMPARISON:  CT HEAD October 16, 2015 and MRI of the head September 29, 2015 FINDINGS: BRAIN: No intraparenchymal hemorrhage, mass effect nor midline shift. The ventricles and sulci are normal for age. Patchy supratentorial white matter hypodensities less than expected for patient's age, though non-specific are most compatible with chronic small vessel ischemic disease. Old small RIGHT cerebellar infarcts. No acute large vascular territory infarcts. No abnormal extra-axial fluid collections. Basal cisterns are patent. VASCULAR: Mild calcific atherosclerosis of the carotid siphons. SKULL: No skull fracture. No significant scalp soft tissue swelling. SINUSES/ORBITS: The mastoid air-cells and included paranasal sinuses are well-aerated.The included ocular globes and orbital contents are non-suspicious. Status post RIGHT scleral banding.  OTHER: None. IMPRESSION: No acute intracranial process. Stable examination: Old small RIGHT cerebellar infarcts, otherwise negative CT HEAD for age. Critical Value/emergent results were called by telephone at the time of interpretation on 11/10/2016 at 8:52 pm to Dr. Gerhard MunchOBERT LOCKWOOD , who verbally acknowledged these results. Electronically Signed   By: Awilda Metroourtnay  Bloomer M.D.   On: 11/10/2016 20:53   Dg Chest Port 1 View  Result Date: 11/10/2016 CLINICAL DATA:  Weakness EXAM: PORTABLE CHEST 1 VIEW COMPARISON:  10/16/2015 chest radiograph. FINDINGS: Stable cardiomediastinal silhouette with top-normal heart size. No pneumothorax. No pleural effusion. Lungs appear clear, with no acute consolidative airspace disease and no pulmonary edema. IMPRESSION: No active disease. Electronically Signed   By: Delbert PhenixJason A Poff M.D.   On: 11/10/2016 20:42    EKG: Unable to retrieve from chart review ECG tab. Per Dr. Eber HongBrian Miller Date: 11/10/2016   Rate: 85  Rhythm: normal sinus rhythm  QRS Axis: left  Intervals: normal  ST/T Wave abnormalities: normal  Conduction Disutrbances:nonspecific intraventricular conduction delay  Narrative Interpretation:   Old EKG Reviewed: none available  Assessment/Plan Principal Problem:   Facial cellulitis Admit to MedSurg. Gentle IV hydration. Continue ceftriaxone 1 g IV PB every 24 hours. Analgesics as needed.  Active Problems:   Generalized weakness Physical therapy evaluation in a.m.    Hyperlipidemia Not taking statin due to weakness side effect.    Hypothyroid Continue levothyroxine 112 g by mouth daily. Check TSH in a.m.    Dementia Supportive care. Check TSH and vitamin B12 levels.    Thrombocytopenia (HCC) Monitor platelet count.    Depression Continue sertraline 100 mg by mouth daily.    Heart murmur Check echocardiogram.   DVT prophylaxis: SCDs. Code Status: DO NOT RESUSCITATE/DO NOT INTUBATE. Family Communication: Hafer,Brian. Patient's son  [phone] Disposition Plan: Admit for IV antibiotics for several days. Consults called: Case management, PT/OT and social services. Admission status: Inpatient/MedSurg.   Bobette Moavid Manuel Caliegh Middlekauff MD [facility] Pager [phone].  If 7PM-7AM, please contact night-coverage [email or web address] Password [email or web address]  11/10/2016, 11:35 PM

## 2016-11-10 NOTE — ED Provider Notes (Signed)
[facility] DEPT Provider Note   CSN: [id number] Arrival date & time: 11/10/16  1955     History   Chief Complaint Chief Complaint  Patient presents with  . Weakness    HPI Trevor Brock is a 83 y.o. male.  HPI  The patient is a 83 year old male, he has a history of advanced dementia, hyperlipidemia, hypothyroidism and depression. He has had sepsis in the past, large buttock cellulitis and abscess in the past, has a significant need for assistance with ambulation by using a walker however today after waking up at 2:00 in the morning and needing significant assistance to get to the bathroom the patient has had a gradual decline. This involves going out to breakfast with his son and becoming so weak that he needed significant assistance getting in and out of the car and then this afternoon through this evening he has been unable to do anything. The son states that he is severely weak, he has not noted anything else abnormal other than some redness on the left side of his face. At baseline the patient has tremor, he is severely demented, he has very little interaction with others verbally, he does not even recognize his wife who he lives with anymore.  Past Medical History:  Diagnosis Date  . Assistance needed for mobility    walks with walker  . Dementia   . Depression   . Hyperlipidemia    Statin intolerance as leg weakness  . Hypothyroid   . Mild memory disturbance     Patient Active Problem List   Diagnosis Date Noted  . Facial cellulitis 11/10/2016  . MRSA carrier 10/22/2015  . Cellulitis and abscess 10/17/2015  . Thrombocytopenia (HCC) 10/17/2015  . Depression 10/17/2015  . Sepsis, unspecified organism (HCC) 10/17/2015  . Malnutrition of moderate degree 10/17/2015  . Anisocoria 10/16/2015  . Pancytopenia (HCC) 10/07/2015  . Syncope 09/28/2015  . Dementia 09/28/2015  . Assistance needed for mobility   . Hyperlipidemia   . Hypothyroid     Past Surgical  History:  Procedure Laterality Date  . CHOLECYSTECTOMY    . RETINAL DETACHMENT SURGERY Right   . TONSILLECTOMY         Home Medications    Prior to Admission medications   Medication Sig Start Date End Date Taking? Authorizing Provider  levothyroxine (SYNTHROID, LEVOTHROID) 112 MCG tablet Take one tablet by mouth 30 minutes before breakfast once daily for thyroid 09/15/15  Yes [provider]  sertraline (ZOLOFT) 50 MG tablet Take 1 tablet (50 mg total) by mouth daily. Patient taking differently: Take 100 mg by mouth daily.  10/03/15  Yes Dorothea OgleMyers, Iskra M, MD    Family History Family History  Problem Relation Age of Onset  . Cancer Mother        Diffuse metastases of unknown primary  . Cancer Sister        Breast  . Cancer Brother        Gastrointestinal  . Stroke Neg Hx   . Diabetes Neg Hx   . Heart disease Neg Hx     Social History Social History  Substance Use Topics  . Smoking status: Never Smoker  . Smokeless tobacco: Not on file  . Alcohol use No     Allergies   Statins   Review of Systems Review of Systems  Unable to perform ROS: Dementia     Physical Exam Updated Vital Signs BP (!) 127/57   Pulse 83   Temp (!) 100.7  F (38.2 C) (Rectal)   Resp 17   Wt 200 lb (90.7 kg)   SpO2 98%   BMI 27.89 kg/m   Physical Exam  Constitutional: He appears well-developed and well-nourished. No distress.  HENT:  Head: Normocephalic and atraumatic.  Mouth/Throat: Oropharynx is clear and moist. No oropharyngeal exudate.  Erythema and warmth to the left cheek, mild left periorbital region.  Able to open and close mouth without difficulty  Eyes: EOM are normal. Pupils are equal, round, and reactive to light. Right eye exhibits no discharge. Left eye exhibits discharge. No scleral icterus.  Left eye with mild conjunctival irritation and some left mucoid discharge  Neck: Normal range of motion. Neck supple. No JVD present. No thyromegaly present.    Cardiovascular: Regular rhythm and intact distal pulses.  Exam reveals no gallop and no friction rub.   Murmur heard. Mildly tachycardic, soft systolic heart murmur  Pulmonary/Chest: Effort normal and breath sounds normal. No respiratory distress. He has no wheezes. He has no rales.  Abdominal: Soft. Bowel sounds are normal. He exhibits no distension and no mass. There is no tenderness.  Musculoskeletal: Normal range of motion. He exhibits no edema or tenderness.  Lymphadenopathy:    He has no cervical adenopathy.  Neurological: He is alert.  The patient's does not speak, he does groan, he follows very simple commands, he is able to assist with sitting up in the bed however he is unable to stay seating and falls backwards. There is no obvious cranial nerve deficits 3 through 12 however it is difficult to test some of them. There is no facial droop.  Skin: Skin is warm and dry. Rash noted. There is erythema.  Left facial redness and tenderness  Psychiatric: He has a normal mood and affect. His behavior is normal.  Nursing note and vitals reviewed.    ED Treatments / Results  Labs (all labs ordered are listed, but only abnormal results are displayed) Labs Reviewed  COMPREHENSIVE METABOLIC PANEL - Abnormal; Notable for the following:       Result Value   Total Protein 6.1 (*)    Albumin 3.2 (*)    AST 44 (*)    Alkaline Phosphatase 145 (*)    All other components within normal limits  CBC WITH DIFFERENTIAL/PLATELET - Abnormal; Notable for the following:    RBC 3.64 (*)    Hemoglobin 12.1 (*)    HCT 36.1 (*)    Platelets 66 (*)    All other components within normal limits  URINALYSIS, ROUTINE W REFLEX MICROSCOPIC - Abnormal; Notable for the following:    Hgb urine dipstick TRACE (*)    All other components within normal limits  URINALYSIS, MICROSCOPIC (REFLEX) - Abnormal; Notable for the following:    Bacteria, UA RARE (*)    Squamous Epithelial / LPF 0-5 (*)    All other  components within normal limits  I-STAT CG4 LACTIC ACID, ED - Abnormal; Notable for the following:    Lactic Acid, Venous 2.35 (*)    All other components within normal limits  CULTURE, BLOOD (ROUTINE X 2)  CULTURE, BLOOD (ROUTINE X 2)  URINE CULTURE  ETHANOL  TROPONIN I  PROTIME-INR  APTT  I-STAT CG4 LACTIC ACID, ED  I-STAT CG4 LACTIC ACID, ED   ED ECG REPORT  I personally interpreted this EKG   Date: 11/10/2016   Rate: 85  Rhythm: normal sinus rhythm  QRS Axis: left  Intervals: normal  ST/T Wave abnormalities: normal  Conduction  Disutrbances:nonspecific intraventricular conduction delay  Narrative Interpretation:   Old EKG Reviewed: none available  Radiology Ct Head Wo Contrast  Result Date: 11/10/2016 CLINICAL DATA:  Unable to ambulate, LEFT facial swelling and LEFT eye droop. History of dementia, hyperlipidemia. EXAM: CT HEAD WITHOUT CONTRAST TECHNIQUE: Contiguous axial images were obtained from the base of the skull through the vertex without intravenous contrast. COMPARISON:  CT HEAD October 16, 2015 and MRI of the head September 29, 2015 FINDINGS: BRAIN: No intraparenchymal hemorrhage, mass effect nor midline shift. The ventricles and sulci are normal for age. Patchy supratentorial white matter hypodensities less than expected for patient's age, though non-specific are most compatible with chronic small vessel ischemic disease. Old small RIGHT cerebellar infarcts. No acute large vascular territory infarcts. No abnormal extra-axial fluid collections. Basal cisterns are patent. VASCULAR: Mild calcific atherosclerosis of the carotid siphons. SKULL: No skull fracture. No significant scalp soft tissue swelling. SINUSES/ORBITS: The mastoid air-cells and included paranasal sinuses are well-aerated.The included ocular globes and orbital contents are non-suspicious. Status post RIGHT scleral banding. OTHER: None. IMPRESSION: No acute intracranial process. Stable examination: Old small RIGHT  cerebellar infarcts, otherwise negative CT HEAD for age. Critical Value/emergent results were called by telephone at the time of interpretation on 11/10/2016 at 8:52 pm to Dr. Gerhard Munch , who verbally acknowledged these results. Electronically Signed   By: Awilda Metro M.D.   On: 11/10/2016 20:53   Dg Chest Port 1 View  Result Date: 11/10/2016 CLINICAL DATA:  Weakness EXAM: PORTABLE CHEST 1 VIEW COMPARISON:  10/16/2015 chest radiograph. FINDINGS: Stable cardiomediastinal silhouette with top-normal heart size. No pneumothorax. No pleural effusion. Lungs appear clear, with no acute consolidative airspace disease and no pulmonary edema. IMPRESSION: No active disease. Electronically Signed   By: Delbert Phenix M.D.   On: 11/10/2016 20:42    Procedures Procedures (including critical care time)  Medications Ordered in ED Medications  sodium chloride 0.9 % bolus 1,000 mL (0 mLs Intravenous Stopped 11/10/16 2201)    And  0.9 %  sodium chloride infusion (1,000 mLs Intravenous New Bag/Given 11/10/16 2053)  cefTRIAXone (ROCEPHIN) 1 g in dextrose 5 % 50 mL IVPB (1 g Intravenous New Bag/Given 11/10/16 2308)  acetaminophen (TYLENOL) tablet 1,000 mg (1,000 mg Oral Given 11/10/16 2309)     Initial Impression / Assessment and Plan / ED Course  I have reviewed the triage vital signs and the nursing notes.  Pertinent labs & imaging results that were available during my care of the patient were reviewed by me and considered in my medical decision making (see chart for details).     The etiology of the patient's symptoms is not clear, he could have a slight fever, he definitely has an infection on the left side of his face which would be consistent with a cellulitis. I do not know that this can be the source of the patient's significant decline in weakness thus other testing will be involved including urinalysis, chest x-ray, CT scan of the brain and labs.  D/w hospitalist Dr. Robb Matar who will  admit Cellulitis likely cause Labs otherwise show elevated Lactic acid.   Final Clinical Impressions(s) / ED Diagnoses   Final diagnoses:  Cellulitis of face  Altered mental status, unspecified altered mental status type    New Prescriptions New Prescriptions   No medications on file     Eber Hong, MD 11/10/16 2313

## 2016-11-10 NOTE — ED Notes (Signed)
Date and time results received: 11/10/16  2040 (use smartphrase ".now" to insert current time)  Test: istat lactic Critical Value: 2.35  Name of Provider Notified: Hyacinth MeekerMiller  Orders Received? Or Actions Taken?:notified

## 2016-11-10 NOTE — ED Triage Notes (Signed)
Per pt son, has mom says was unable to walk to restroom. This morning around 0830 pt son states pt late stages on dementia, today needed assistance w/ movement. Later this afternoon unable to do that. Son states swelling to the left side of the face, redness noted with some droop of the left eye.

## 2016-11-11 ENCOUNTER — Inpatient Hospital Stay (HOSPITAL_COMMUNITY): Payer: Medicare Other

## 2016-11-11 ENCOUNTER — Encounter (HOSPITAL_COMMUNITY): Payer: Self-pay | Admitting: Internal Medicine

## 2016-11-11 DIAGNOSIS — E038 Other specified hypothyroidism: Secondary | ICD-10-CM

## 2016-11-11 DIAGNOSIS — F0390 Unspecified dementia without behavioral disturbance: Secondary | ICD-10-CM

## 2016-11-11 DIAGNOSIS — R011 Cardiac murmur, unspecified: Secondary | ICD-10-CM | POA: Diagnosis present

## 2016-11-11 DIAGNOSIS — L03211 Cellulitis of face: Principal | ICD-10-CM

## 2016-11-11 DIAGNOSIS — I361 Nonrheumatic tricuspid (valve) insufficiency: Secondary | ICD-10-CM

## 2016-11-11 DIAGNOSIS — D696 Thrombocytopenia, unspecified: Secondary | ICD-10-CM

## 2016-11-11 DIAGNOSIS — R531 Weakness: Secondary | ICD-10-CM

## 2016-11-11 DIAGNOSIS — R471 Dysarthria and anarthria: Secondary | ICD-10-CM

## 2016-11-11 LAB — BASIC METABOLIC PANEL
Anion gap: 4 — ABNORMAL LOW (ref 5–15)
BUN: 14 mg/dL (ref 6–20)
CALCIUM: 8.1 mg/dL — AB (ref 8.9–10.3)
CO2: 24 mmol/L (ref 22–32)
CREATININE: 0.89 mg/dL (ref 0.61–1.24)
Chloride: 108 mmol/L (ref 101–111)
GFR calc non Af Amer: >60 mL/min (ref >60)
Glucose, Bld: 98 mg/dL (ref 65–99)
Potassium: 3.6 mmol/L (ref 3.5–5.1)
Sodium: 136 mmol/L (ref 135–145)

## 2016-11-11 LAB — CBC WITH DIFFERENTIAL/PLATELET
BASOS PCT: 1 %
Basophils Absolute: 0 10*3/uL (ref 0.0–0.1)
Eosinophils Absolute: 0.1 10*3/uL (ref 0.0–0.7)
Eosinophils Relative: 3 %
HEMATOCRIT: 32 % — AB (ref 39.0–52.0)
HEMOGLOBIN: 11 g/dL — AB (ref 13.0–17.0)
Lymphocytes Relative: 29 %
Lymphs Abs: 1.6 10*3/uL (ref 0.7–4.0)
MCH: 34.2 pg — ABNORMAL HIGH (ref 26.0–34.0)
MCHC: 34.4 g/dL (ref 30.0–36.0)
MCV: 99.4 fL (ref 78.0–100.0)
Monocytes Absolute: 0.7 10*3/uL (ref 0.1–1.0)
Monocytes Relative: 12 %
NEUTROS ABS: 3 10*3/uL (ref 1.7–7.7)
NEUTROS PCT: 55 %
Platelets: 59 10*3/uL — ABNORMAL LOW (ref 150–400)
RBC: 3.22 MIL/uL — AB (ref 4.22–5.81)
RDW: 14.7 % (ref 11.5–15.5)
WBC: 5.5 10*3/uL (ref 4.0–10.5)

## 2016-11-11 LAB — VITAMIN B12: Vitamin B-12: 575 pg/mL (ref 180–914)

## 2016-11-11 LAB — TSH: TSH: 1.217 u[IU]/mL (ref 0.350–4.500)

## 2016-11-11 LAB — MRSA PCR SCREENING: MRSA by PCR: POSITIVE — AB

## 2016-11-11 MED ORDER — LEVOTHYROXINE SODIUM 112 MCG PO TABS
112.0000 ug | ORAL_TABLET | Freq: Every day | ORAL | Status: DC
  Administered 2016-11-11 – 2016-11-13 (×3): 112 ug via ORAL
  Filled 2016-11-11 (×3): qty 1

## 2016-11-11 MED ORDER — SODIUM CHLORIDE 0.9 % IV SOLN
INTRAVENOUS | Status: DC
  Administered 2016-11-11 – 2016-11-12 (×3): via INTRAVENOUS

## 2016-11-11 MED ORDER — SERTRALINE HCL 50 MG PO TABS
100.0000 mg | ORAL_TABLET | Freq: Every day | ORAL | Status: DC
  Administered 2016-11-11 – 2016-11-13 (×3): 100 mg via ORAL
  Filled 2016-11-11 (×3): qty 2

## 2016-11-11 MED ORDER — PANTOPRAZOLE SODIUM 40 MG PO TBEC
40.0000 mg | DELAYED_RELEASE_TABLET | Freq: Every day | ORAL | Status: DC
  Administered 2016-11-12 – 2016-11-13 (×2): 40 mg via ORAL
  Filled 2016-11-11 (×3): qty 1

## 2016-11-11 MED ORDER — DEXTROSE 5 % IV SOLN
1.0000 g | INTRAVENOUS | Status: DC
  Administered 2016-11-11 – 2016-11-12 (×2): 1 g via INTRAVENOUS
  Filled 2016-11-11 (×5): qty 10

## 2016-11-11 MED ORDER — ONDANSETRON HCL 4 MG/2ML IJ SOLN: 4.0000 mg | Freq: Four times a day (QID) | INTRAMUSCULAR | Status: DC | PRN

## 2016-11-11 MED ORDER — KETOROLAC TROMETHAMINE 15 MG/ML IJ SOLN: 15.0000 mg | Freq: Four times a day (QID) | INTRAMUSCULAR | Status: DC | PRN

## 2016-11-11 MED ORDER — ONDANSETRON HCL 4 MG PO TABS: 4.0000 mg | ORAL_TABLET | Freq: Four times a day (QID) | ORAL | Status: DC | PRN

## 2016-11-11 MED ORDER — ACETAMINOPHEN 325 MG PO TABS
650.0000 mg | ORAL_TABLET | Freq: Four times a day (QID) | ORAL | Status: DC | PRN
  Administered 2016-11-11 – 2016-11-12 (×2): 650 mg via ORAL
  Filled 2016-11-11 (×2): qty 2

## 2016-11-11 NOTE — Evaluation (Signed)
Physical Therapy Evaluation Patient Details Name: Trevor Brock MRN: [id number] DOB: Nov 13, 1932 Today's Date: 11/11/2016   History of Present Illness  Trevor Brock is a 83 y.o. male with medical history significant of dementia, depression, hyperlipidemia, hypothyroidism, thrombocytopenia who was brought to the emergency department by family members due to weakness and left facial swelling. Per patient's son, [name], until yesterday the patient's was walking with a walker or assistance from family members. Since around 0200 today, the patient has been a lot weaker and more confused than usual, which was noticed when his wife was helping him to get to the bathroom. Later this morning, his son took him out to have breakfast around 0800. They returned home around 1000 and he was unable to get outside of the vehicle. He was unable to walk and the patient's son had to call the patient's son-in-law to assist him getting the patient out from the vehicle and into his home. When they asked him to walk, the patient would only lift his feet, but would not move forward. Once inside the house, they also had to physically lift him to take him to the toilet. Later in the afternoon, the patient developed left facial erythema, tenderness and edema. He felt febrile to the touch and was febrile in the emergency department with a temperature of 100.36F.     Clinical Impression  Pt received in bed, wife and son present, and pt is agreeable to PT evaluation.  Pt normally requires Min A from wife when ambulating with RW, however, over the past few days, he has required +2 person assistance.  During PT evaluation today, he required Mod/Max A for supine<>sit.  He was able to stand with Max A and RW.  During this transfer, he grabs his R hip, groans, and acknowledges pain in his R hip.  He was unable to weight shift to be able to take steps and transfer.  Recommend SNF at d/c to improve strength and mobility.      Follow Up Recommendations SNF    Equipment Recommendations  None recommended by PT    Recommendations for Other Services       Precautions / Restrictions Precautions Precautions: Fall Precaution Comments: Due to cognitive status, and immobility Restrictions Weight Bearing Restrictions: No      Mobility  Bed Mobility Overal bed mobility: Needs Assistance Bed Mobility: Supine to Sit;Rolling Rolling: Min assist (Assistance for hand placement. )   Supine to sit: Mod assist;HOB elevated;Max assist     General bed mobility comments: Pt requires assistance to advance LE's off the EOB, and also assistance with bed pad to scoot hips to the EOB.    Transfers Overall transfer level: Needs assistance Equipment used: Rolling walker (2 wheeled) Transfers: Sit to/from Stand Sit to Stand: Max assist         General transfer comment: Pt requires assistance to shift weight anteriorly and then counting 1,2,3 to be able to stand.  Upon standing, pt reaching for R hip and groans.  He answered "yes" that his R hip was hurting.  Pt demonstrates strong posterior lean with calves resting against the bed, and inabiltiy to fully extend hips to come into full upright standing.  Pt is not able to weight shift in order to take any steps.   Ambulation/Gait Ambulation/Gait assistance:  (NA due to poor standing balance/tolerance. )              Stairs  Wheelchair Mobility    Modified Rankin (Stroke Patients Only)       Balance Overall balance assessment: Needs assistance Sitting-balance support: Bilateral upper extremity supported;Feet supported Sitting balance-Leahy Scale: Fair Sitting balance - Comments: Pt demonstrates posterior lean in sitting, and requires Min gaurd to prevent posterior LOB   Standing balance support: Bilateral upper extremity supported Standing balance-Leahy Scale: Poor                               Pertinent Vitals/Pain Pain  Assessment: Faces Faces Pain Scale: Hurts even more Pain Location: R hip Pain Descriptors / Indicators: Grimacing Pain Intervention(s): Limited activity within patient's tolerance;Monitored during session;Repositioned    Home Living Family/patient expects to be discharged to:: Private residence Living Arrangements: Spouse/significant other Available Help at Discharge: Other (Comment) (family is unable to provide care ) Type of Home: House         Home Equipment: Dan Humphreys - 2 wheels;Hospital bed;Wheelchair - Careers adviser (comment) (Lift chair)      Prior Function Level of Independence: Needs assistance   Gait / Transfers Assistance Needed: Per wife: pt has been using RW for very short distances with assistance  ADL's / Homemaking Assistance Needed: Pt wife and hospice aide have been providing max-total care for pt        Hand Dominance   Dominant Hand: Right (on observation)    Extremity/Trunk Assessment   Upper Extremity Assessment Upper Extremity Assessment: Defer to OT evaluation    Lower Extremity Assessment Lower Extremity Assessment:  (Pt has difficulty following commands for MMT, therefore strength determined through functional mobiltiy evaluation. )       Communication   Communication: Other (comment) (dementia limiting communication)  Cognition Arousal/Alertness: Awake/alert (but closes his eyes to rest frequently. ) Behavior During Therapy: WFL for tasks assessed/performed Overall Cognitive Status: History of cognitive impairments - at baseline                                 General Comments: Pt unable to effectively communication with OT. Wife reports pt will speak minimally during the day, is unable to say wife or children's names      General Comments      Exercises     Assessment/Plan    PT Assessment Patient needs continued PT services  PT Problem List Decreased strength;Decreased activity tolerance;Decreased balance;Decreased  mobility       PT Treatment Interventions DME instruction;Gait training;Functional mobility training;Therapeutic activities;Therapeutic exercise;Balance training;Patient/family education    PT Goals (Current goals can be found in the Care Plan section)  Acute Rehab PT Goals Patient Stated Goal: Family wants pt to get stronger PT Goal Formulation: With family Time For Goal Achievement: 11/25/16 Potential to Achieve Goals: Fair    Frequency Min 3X/week   Barriers to discharge   Pt lives with his wife, who is unable to provide physical assistance anymore     Co-evaluation               AM-PAC PT "6 Clicks" Daily Activity  Outcome Measure Difficulty turning over in bed (including adjusting bedclothes, sheets and blankets)?: A Little Difficulty moving from lying on back to sitting on the side of the bed? : A Little Difficulty sitting down on and standing up from a chair with arms (e.g., wheelchair, bedside commode, etc,.)?: A Lot Help needed moving to and from a bed  to chair (including a wheelchair)?: A Lot Help needed walking in hospital room?: Total Help needed climbing 3-5 steps with a railing? : Total 6 Click Score: 12    End of Session Equipment Utilized During Treatment: Gait belt Activity Tolerance: Patient limited by pain;Patient limited by fatigue Patient left: in bed;with call bell/phone within reach;with bed alarm set;with family/visitor present Nurse Communication: Mobility status;Need for lift equipment (Maxi move for transfers. ) PT Visit Diagnosis: Unsteadiness on feet (R26.81);Other abnormalities of gait and mobility (R26.89);Muscle weakness (generalized) (M62.81);History of falling (Z91.81);Difficulty in walking, not elsewhere classified (R26.2)    Time: [id number] PT Time Calculation (min) (ACUTE ONLY): 26 min   Charges:   PT Evaluation $PT Eval Low Complexity: 1 Procedure PT Treatments $Therapeutic Activity: 8-22 mins   PT G Codes:   PT G-Codes **NOT  FOR INPATIENT CLASS** Functional Assessment Tool Used: Clinical judgement;AM-PAC 6 Clicks Basic Mobility Functional Limitation: Mobility: Walking and moving around Mobility: Walking and Moving Around Current Status ([id number](G8978): At least 60 percent but less than 80 percent impaired, limited or restricted Mobility: Walking and Moving Around Goal Status [phone](G8979): At least 40 percent but less than 60 percent impaired, limited or restricted    Beth Emara Lichter, PT, DPT X: [phone]

## 2016-11-11 NOTE — Clinical Social Work Placement (Signed)
   CLINICAL SOCIAL WORK PLACEMENT  NOTE  Date:  11/11/2016  Patient Details  Name: Trevor Brock MRN: [id number] Date of Birth: 1933/01/24  Clinical Social Work is seeking post-discharge placement for this patient at the Skilled  Nursing Facility level of care (*CSW will initial, date and re-position this form in  chart as items are completed):  Yes   Patient/family provided with [facility] Clinical Social Work Department's list of facilities offering this level of care within the geographic area requested by the patient (or if unable, by the patient's family).  Yes   Patient/family informed of their freedom to choose among providers that offer the needed level of care, that participate in Medicare, Medicaid or managed care program needed by the patient, have an available bed and are willing to accept the patient.  Yes   Patient/family informed of [facility]'s ownership interest [facility] and [facility], as well as of the fact that they are under no obligation to receive care at these facilities.  PASRR submitted to EDS on       PASRR number received on       Existing PASRR number confirmed on 11/11/16     FL2 transmitted to all facilities in geographic area requested by pt/family on 11/11/16     FL2 transmitted to all facilities within larger geographic area on       Patient informed that his/her managed care company has contracts with or will negotiate with certain facilities, including the following:            Patient/family informed of bed offers received.  Patient chooses bed at       Physician recommends and patient chooses bed at      Patient to be transferred to   on  .  Patient to be transferred to facility by       Patient family notified on   of transfer.  Name of family member notified:        PHYSICIAN       Additional Comment:    _______________________________________________ Annice NeedySettle, Vivica Dobosz D, LCSW 11/11/2016, 11:11 AM

## 2016-11-11 NOTE — [place] FL2 (Signed)
[place] MEDICAID FL2 LEVEL OF CARE SCREENING TOOL     IDENTIFICATION  Patient Name: Trevor Brock Birthdate: [birth year] Sex: male Admission Date (Current Location): 11/10/2016  [facility] and [place] Number:  [employer] and Address:  [facility],  [address] [place] [address]      Provider Number: [id number]  Attending Physician Name and Address:  Erick Blinks, MD  Relative Name and Phone Number:       Current Level of Care: Hospital Recommended Level of Care: Skilled Nursing Facility Prior Approval Number:    Date Approved/Denied:   PASRR Number: [id number]  Discharge Plan: SNF    Current Diagnoses: Patient Active Problem List   Diagnosis Date Noted  . Generalized weakness 11/11/2016  . Heart murmur 11/11/2016  . Facial cellulitis 11/10/2016  . MRSA carrier 10/22/2015  . Cellulitis and abscess 10/17/2015  . Thrombocytopenia (HCC) 10/17/2015  . Depression 10/17/2015  . Sepsis, unspecified organism (HCC) 10/17/2015  . Malnutrition of moderate degree 10/17/2015  . Anisocoria 10/16/2015  . Pancytopenia (HCC) 10/07/2015  . Syncope 09/28/2015  . Dementia 09/28/2015  . Assistance needed for mobility   . Hyperlipidemia   . Hypothyroid     Orientation RESPIRATION BLADDER Height & Weight     Self  Normal Incontinent Weight: 185 lb 11.2 oz (84.2 kg) Height:     BEHAVIORAL SYMPTOMS/MOOD NEUROLOGICAL BOWEL NUTRITION STATUS      Incontinent Diet (Heart Healthy)  AMBULATORY STATUS COMMUNICATION OF NEEDS Skin   Extensive Assist Verbally Normal                       Personal Care Assistance Level of Assistance  Bathing, Feeding Bathing Assistance: Maximum assistance Feeding assistance: Independent       Functional Limitations Info  Sight, Hearing, Speech Sight Info: Adequate Hearing Info: Impaired (HOH) Speech Info: Adequate    SPECIAL CARE FACTORS FREQUENCY  PT (By licensed PT)     PT Frequency: 5x/week              Contractures Contractures Info: Not present    Additional Factors Info  Code Status, Allergies, Psychotropic Code Status Info: DNR Allergies Info: Statins Psychotropic Info: Zoloftr         Current Medications (11/11/2016):  This is the current hospital active medication list Current Facility-Administered Medications  Medication Dose Route Frequency Provider Last Rate Last Dose  . 0.9 %  sodium chloride infusion   Intravenous Continuous Bobette Mo, MD 75 mL/hr at 11/11/16 5621    . acetaminophen (TYLENOL) tablet 650 mg  650 mg Oral Q6H PRN Bobette Mo, MD      . cefTRIAXone (ROCEPHIN) 1 g in dextrose 5 % 50 mL IVPB  1 g Intravenous Q24H Bobette Mo, MD      . ketorolac (TORADOL) 15 MG/ML injection 15 mg  15 mg Intravenous Q6H PRN Bobette Mo, MD      . levothyroxine (SYNTHROID, LEVOTHROID) tablet 112 mcg  112 mcg Oral QAC breakfast Bobette Mo, MD   112 mcg at 11/11/16 0815  . ondansetron (ZOFRAN) tablet 4 mg  4 mg Oral Q6H PRN Bobette Mo, MD       Or  . ondansetron [facility]) injection 4 mg  4 mg Intravenous Q6H PRN Bobette Mo, MD      . pantoprazole (PROTONIX) EC tablet 40 mg  40 mg Oral Daily Bobette Mo, MD      .  sertraline (ZOLOFT) tablet 100 mg  100 mg Oral Daily Bobette Mortiz, David Manuel, MD         Discharge Medications: Please see discharge summary for a list of discharge medications.  Relevant Imaging Results:  Relevant Lab Results:   Additional Information SS# [id number]  Annice NeedySettle, Ahlijah Raia D, LCSW

## 2016-11-11 NOTE — Care Management Note (Addendum)
Case Management Note  Patient Details  Name: Trevor Brock G Vitale MRN: [id number] Date of Birth: Oct 30, 1932  Subjective/Objective:   Adm with facial cellulitis. From home with wife. [facility] based on malnutrition diagnosis. Recommended for SNF. Family agreeable. CM spoke with hospice who says patient can revoke Hospice services and DC to SNF.  Family agreeable to this as patient has been eating good but mobility has declined.             Action/Plan: CSW aware and making arrangements. CM will follow.   Expected Discharge Date:       11/12/2016           Expected Discharge Plan:  Skilled Nursing Facility  In-House Referral:     Discharge planning Services  CM Consult  Post Acute Care Choice:    Choice offered to:     DME Arranged:    DME Agency:     HH Arranged:    HH Agency:     Status of Service:  In process, will continue to follow  If discussed at Long Length of Stay Meetings, dates discussed:    Additional Comments:  Demitrios Molyneux, Chrystine OilerSharley Diane, RN 11/11/2016, 2:28 PM

## 2016-11-11 NOTE — Clinical Social Work Note (Signed)
Clinical Social Work Assessment  Patient Details  Name: Trevor Brock MRN: [id number] Date of Birth: 05-03-1933  Date of referral:  11/11/16               Reason for consult:  Discharge Planning                Permission sought to share information with:    Permission granted to share information::     Name::        Agency::     Relationship::     Contact Information:  Trevor Brock and son Trevor Brock were both at bedside.   Housing/Transportation Living arrangements for the past 2 months:  Single Family Home Source of Information:  Adult Children, Spouse Patient Interpreter Needed:  None Criminal Activity/Legal Involvement Pertinent to Current Situation/Hospitalization:  No - Comment as needed Significant Relationships:  Adult Children, Spouse Lives with:  Spouse Do you feel safe going back to the place where you live?  Yes Need for family participation in patient care:  Yes (Comment)  Care giving concerns:  Patient's wife is his primary caregiver. Patient's son, Trevor Brock, says that patient's care has become too great for Trevor Brock.     Social Worker assessment / plan:  Patient lives with spouse, uses a walker when he is at home and a wheelchair when out in the community.  Patient has Hospice services involved in his home and they come to his home. Family is interested in pursing the PT recommendation of SNF.  [facility] is first choice and [facility] is second choice.    Employment status:  Retired Health and safety inspectornsurance information:  Medicare PT Recommendations:  Skilled Nursing Facility Information / Referral to community resources:  Skilled Nursing Facility  Patient/Family's Response to care:  Family is agreeable to SNF.   Patient/Family's Understanding of and Emotional Response to Diagnosis, Current Treatment, and Prognosis:  Family understands patient's diagnosis, treatment and prognosis.    Emotional Assessment Appearance:  Appears stated age Attitude/Demeanor/Rapport:   (Cooperative) Affect  (typically observed):  Calm Orientation:  Oriented to Self Alcohol / Substance use:  Not Applicable Psych involvement (Current and /or in the community):  No (Comment)  Discharge Needs  Concerns to be addressed:  Discharge Planning Concerns Readmission within the last 30 days:  No Current discharge risk:  Chronically ill Barriers to Discharge:  No Barriers Identified   Trevor Brock, Trevor Infantino D, LCSW 11/11/2016, 11:05 AM

## 2016-11-11 NOTE — Progress Notes (Signed)
PROGRESS NOTE    Trevor Brock  PPI:[id number] DOB: 1932/10/30 DOA: 11/10/2016 PCP: [name], [name], MD    Brief Narrative:  83 year old male with a history of dementia who is brought to the hospital with weakness and facial swelling. Found to have left-sided facial cellulitis and low-grade fevers. Started on intravenous antibiotics. Plan is for probable placement to nursing home for therapy. He is generally weak.   Assessment & Plan:   Principal Problem:   Facial cellulitis Active Problems:   Hyperlipidemia   Hypothyroid   Dementia   Thrombocytopenia (HCC)   Depression   Generalized weakness   Heart murmur   1. Left facial cellulitis. Patient continues to have some low-grade fevers. He has been started on ceftriaxone. Family feels that he may have had some improvement since admission. Continue current treatments for now. 2. Generalized weakness with dysarthria. Family feels that his speech is significantly declined since the day before admission. There is questionable left-sided facial droop, although this may be related to swelling from his cellulitis. Will check MRI brain. Physical therapy consult. 3. Hyperlipidemia. Does not tolerate statins due to muscle weakness. 4. Hypothyroidism. Continue on Synthroid 5. Dementia 6. Thrombocytopenia, chronic.   DVT prophylaxis: SCDs Code Status: DNR Family Communication: discussed with wife and son at the bedside Disposition Plan: possible SNF placement   Consultants:     Procedures:     Antimicrobials:   Rocephin 5/15>>   Subjective: Family feels that swelling on right side of face is improving. Speech remains incoherent  Objective: Vitals:   11/10/16 2300 11/11/16 0034 11/11/16 0500 11/11/16 1126  BP: (!) 127/57 (!) 121/40 (!) 122/52   Pulse: 83 81 80   Resp: 17 18 18    Temp:  98.5 F (36.9 C) 98.5 F (36.9 C) 99.4 F (37.4 C)  TempSrc:  Oral Oral Axillary  SpO2: 98% 96% 96%   Weight:  84.2 kg (185 lb 11.2  oz)      Intake/Output Summary (Last 24 hours) at 11/11/16 1307 Last data filed at 11/11/16 1229  Gross per 24 hour  Intake          1668.75 ml  Output              700 ml  Net           968.75 ml   Filed Weights   11/10/16 2013 11/11/16 0034  Weight: 90.7 kg (200 lb) 84.2 kg (185 lb 11.2 oz)    Examination:  General exam: Appears calm and comfortable  Respiratory system: Clear to auscultation. Respiratory effort normal. Cardiovascular system: S1 & S2 heard, RRR. No JVD, murmurs, rubs, gallops or clicks. No pedal edema. Gastrointestinal system: Abdomen is nondistended, soft and nontender. No organomegaly or masses felt. Normal bowel sounds heard. Central nervous system: confused. Possible mild left facial droop Extremities: Symmetric power. Skin: erythema and swelling on right side of face Psychiatry: speech is incoherent    Data Reviewed: I have personally reviewed following labs and imaging studies  CBC:  Recent Labs Lab 11/10/16 2023 11/11/16 0540  WBC 6.7 5.5  NEUTROABS 3.7 3.0  HGB 12.1* 11.0*  HCT 36.1* 32.0*  MCV 99.2 99.4  PLT 66* 59*   Basic Metabolic Panel:  Recent Labs Lab 11/10/16 2023 11/11/16 0540  NA 138 136  K 3.9 3.6  CL 105 108  CO2 26 24  GLUCOSE 96 98  BUN 13 14  CREATININE 1.03 0.89  CALCIUM 9.0 8.1*   GFR: CrCl cannot  be calculated (Unknown ideal weight.). Liver Function Tests:  Recent Labs Lab 11/10/16 2023  AST 44*  ALT 27  ALKPHOS 145*  BILITOT 1.0  PROT 6.1*  ALBUMIN 3.2*   No results for input(s): LIPASE, AMYLASE in the last 168 hours. No results for input(s): AMMONIA in the last 168 hours. Coagulation Profile:  Recent Labs Lab 11/10/16 2022  INR 1.03   Cardiac Enzymes:  Recent Labs Lab 11/10/16 2023  TROPONINI <0.03   BNP (last 3 results) No results for input(s): PROBNP in the last 8760 hours. HbA1C: No results for input(s): HGBA1C in the last 72 hours. CBG: No results for input(s): GLUCAP in the  last 168 hours. Lipid Profile: No results for input(s): CHOL, HDL, LDLCALC, TRIG, CHOLHDL, LDLDIRECT in the last 72 hours. Thyroid Function Tests:  Recent Labs  11/10/16 0630  TSH 1.217   Anemia Panel: No results for input(s): VITAMINB12, FOLATE, FERRITIN, TIBC, IRON, RETICCTPCT in the last 72 hours. Sepsis Labs:  Recent Labs Lab 11/10/16 2031  LATICACIDVEN 2.35*    Recent Results (from the past 240 hour(s))  Blood culture (routine x 2)     Status: None (Preliminary result)   Collection Time: 11/10/16  8:23 PM  Result Value Ref Range Status   Specimen Description RIGHT ANTECUBITAL  Final   Special Requests   Final    BOTTLES DRAWN AEROBIC AND ANAEROBIC Blood Culture adequate volume   Culture PENDING  Incomplete   Report Status PENDING  Incomplete  Blood culture (routine x 2)     Status: None (Preliminary result)   Collection Time: 11/10/16  8:23 PM  Result Value Ref Range Status   Specimen Description BLOOD LEFT HAND  Final   Special Requests   Final    BOTTLES DRAWN AEROBIC AND ANAEROBIC Blood Culture adequate volume   Culture PENDING  Incomplete   Report Status PENDING  Incomplete  MRSA PCR Screening     Status: Abnormal   Collection Time: 11/11/16  1:00 AM  Result Value Ref Range Status   MRSA by PCR POSITIVE (A) NEGATIVE Final    Comment:        The GeneXpert MRSA Assay (FDA approved for NASAL specimens only), is one component of a comprehensive MRSA colonization surveillance program. It is not intended to diagnose MRSA infection nor to guide or monitor treatment for MRSA infections. RESULT CALLED TO, READ BACK BY AND VERIFIED WITH: BIVENS L. AT 0850A ON [id number] BY THOMPSON S.          Radiology Studies: Ct Head Wo Contrast  Result Date: 11/10/2016 CLINICAL DATA:  Unable to ambulate, LEFT facial swelling and LEFT eye droop. History of dementia, hyperlipidemia. EXAM: CT HEAD WITHOUT CONTRAST TECHNIQUE: Contiguous axial images were obtained from the  base of the skull through the vertex without intravenous contrast. COMPARISON:  CT HEAD October 16, 2015 and MRI of the head September 29, 2015 FINDINGS: BRAIN: No intraparenchymal hemorrhage, mass effect nor midline shift. The ventricles and sulci are normal for age. Patchy supratentorial white matter hypodensities less than expected for patient's age, though non-specific are most compatible with chronic small vessel ischemic disease. Old small RIGHT cerebellar infarcts. No acute large vascular territory infarcts. No abnormal extra-axial fluid collections. Basal cisterns are patent. VASCULAR: Mild calcific atherosclerosis of the carotid siphons. SKULL: No skull fracture. No significant scalp soft tissue swelling. SINUSES/ORBITS: The mastoid air-cells and included paranasal sinuses are well-aerated.The included ocular globes and orbital contents are non-suspicious. Status post RIGHT scleral banding. OTHER:  None. IMPRESSION: No acute intracranial process. Stable examination: Old small RIGHT cerebellar infarcts, otherwise negative CT HEAD for age. Critical Value/emergent results were called by telephone at the time of interpretation on 11/10/2016 at 8:52 pm to Dr. Gerhard Munch , who verbally acknowledged these results. Electronically Signed   By: Awilda Metro M.D.   On: 11/10/2016 20:53   Dg Chest Port 1 View  Result Date: 11/10/2016 CLINICAL DATA:  Weakness EXAM: PORTABLE CHEST 1 VIEW COMPARISON:  10/16/2015 chest radiograph. FINDINGS: Stable cardiomediastinal silhouette with top-normal heart size. No pneumothorax. No pleural effusion. Lungs appear clear, with no acute consolidative airspace disease and no pulmonary edema. IMPRESSION: No active disease. Electronically Signed   By: Delbert Phenix M.D.   On: 11/10/2016 20:42        Scheduled Meds: . levothyroxine  112 mcg Oral QAC breakfast  . pantoprazole  40 mg Oral Daily  . sertraline  100 mg Oral Daily   Continuous Infusions: . sodium chloride 75  mL/hr at 11/11/16 0312  . cefTRIAXone (ROCEPHIN)  IV       LOS: 1 day    Time spent:    Galileo Colello, MD [facility] Pager [phone]  If 7PM-7AM, please contact night-coverage [email or web address] Password [email or web address] 11/11/2016, 1:07 PM

## 2016-11-11 NOTE — Evaluation (Addendum)
Occupational Therapy Evaluation Patient Details Name: Trevor Brock Neubauer MRN: [id number] DOB: 12-11-1932 Today's Date: 11/11/2016    History of Present Illness Trevor Brock Pangilinan is a 83 y.o. male with medical history significant of dementia, depression, hyperlipidemia, hypothyroidism, thrombocytopenia who was brought to the emergency department by family members due to weakness and left facial swelling. Per patient's son, Mr. Julio AlmBrian Barman, until yesterday the patient's was walking with a walker or assistance from family members. Since around 0200 today, the patient has been a lot weaker and more confused than usual, which was noticed when his wife was helping him to get to the bathroom. Later this morning, his son took him out to have breakfast around 0800. They returned home around 1000 and he was unable to get outside of the vehicle. He was unable to walk and the patient's son had to call the patient's son-in-law to assist him getting the patient out from the vehicle and into his home. When they asked him to walk, the patient would only lift his feet, but would not move forward. Once inside the house, they also had to physically lift him to take him to the toilet. Later in the afternoon, the patient developed left facial erythema, tenderness and edema. He felt febrile to the touch and was febrile in the emergency department with a temperature of 100.76F.    Clinical Impression   Pt received in bed, wife at bedside. Pt unable to effectively communicate due to cognition, is able to initially say hello however unable to provide name or continue to communicate. Pt wife reports pt has been receiving hospice services including RN, SW, and aide 2x/week. Pt wife has been providing total care for the past 1-2 months, however is unable to continue providing this level of physical assistance. During evaluation pt requiring total assist for sequencing tasks, and is unable to follow simple one-step commands for  majority of the session. At this time pt is not appropriate for skilled OT services due to cognition limiting participation and carryover. Pt family is interested inpatient hospice services if pt would qualify, as they are unable to afford private aide services to supplement current in-home hospice care. No further acute OT needs at this time.     Follow Up Recommendations  No OT follow up         Precautions / Restrictions Precautions Precautions: Fall Restrictions Weight Bearing Restrictions: No      Mobility Bed Mobility               General bed mobility comments: Not attempted  Transfers                 General transfer comment: Not attempted        ADL either performed or assessed with clinical judgement   ADL Overall ADL's : Needs assistance/impaired Eating/Feeding: Maximal assistance;Cueing for sequencing;Bed level   Grooming: Oral care;Maximal assistance;Cueing for sequencing;Bed level                                 General ADL Comments: Pt is unable to sequence tasks this am. Wife reports he can usually feed himself and brush his teeth, however this am required max assist for sequencing and completion     Vision   Additional Comments: unable to complete due to cognition            Pertinent Vitals/Pain Pain Assessment: No/denies pain  Hand Dominance Right (on observation)   Extremity/Trunk Assessment Upper Extremity Assessment Upper Extremity Assessment: Generalized weakness   Lower Extremity Assessment Lower Extremity Assessment: Defer to PT evaluation       Communication Communication Communication: Other (comment) (dementia limiting communication)   Cognition Arousal/Alertness: Lethargic Behavior During Therapy: WFL for tasks assessed/performed Overall Cognitive Status: History of cognitive impairments - at baseline                                 General Comments: Pt unable to effectively  communication with OT. Wife reports pt will speak minimally during the day, is unable to say wife or children's names              Home Living Family/patient expects to be discharged to:: Private residence Living Arrangements: Spouse/significant other Available Help at Discharge: Other (Comment) (family unable to provide necessary care) Type of Home: House             Bathroom Shower/Tub: Tub/shower unit (pt sponge bathes)   Bathroom Toilet: Handicapped height     Home Equipment: Environmental consultant - 2 wheels;Hospital bed;Wheelchair - manual          Prior Functioning/Environment Level of Independence: Needs assistance  Gait / Transfers Assistance Needed: Per wife: pt has been using RW for very short distances with heavy assistance ADL's / Homemaking Assistance Needed: Pt wife and hospice aide have been providing max-total care for pt            OT Problem List: Decreased cognition;Decreased strength       AM-PAC PT "6 Clicks" Daily Activity     Outcome Measure Help from another person eating meals?: A Lot Help from another person taking care of personal grooming?: A Lot Help from another person toileting, which includes using toliet, bedpan, or urinal?: Total Help from another person bathing (including washing, rinsing, drying)?: Total Help from another person to put on and taking off regular upper body clothing?: Total Help from another person to put on and taking off regular lower body clothing?: Total 6 Click Score: 8   End of Session    Activity Tolerance: Patient limited by lethargy;Other (comment) (limited due to cognition) Patient left: in bed;with call bell/phone within reach;with chair alarm set;with family/visitor present  OT Visit Diagnosis: Other symptoms and signs involving cognitive function                Time: 1610-9604 OT Time Calculation (min): 34 min Charges:  OT General Charges $OT Visit: 1 Procedure OT Evaluation $OT Eval Low Complexity: 1  Procedure     Ezra Sites, OTR/L  [phone] 11/11/2016, 9:30 AM

## 2016-11-12 DIAGNOSIS — E785 Hyperlipidemia, unspecified: Secondary | ICD-10-CM

## 2016-11-12 LAB — CBC WITH DIFFERENTIAL/PLATELET
Basophils Absolute: 0 10*3/uL (ref 0.0–0.1)
Basophils Relative: 1 %
EOS PCT: 3 %
Eosinophils Absolute: 0.1 10*3/uL (ref 0.0–0.7)
HCT: 30.1 % — ABNORMAL LOW (ref 39.0–52.0)
Hemoglobin: 10 g/dL — ABNORMAL LOW (ref 13.0–17.0)
LYMPHS ABS: 1.5 10*3/uL (ref 0.7–4.0)
LYMPHS PCT: 29 %
MCH: 33.2 pg (ref 26.0–34.0)
MCHC: 33.2 g/dL (ref 30.0–36.0)
MCV: 100 fL (ref 78.0–100.0)
MONO ABS: 0.6 10*3/uL (ref 0.1–1.0)
MONOS PCT: 11 %
Neutro Abs: 2.8 10*3/uL (ref 1.7–7.7)
Neutrophils Relative %: 56 %
PLATELETS: 49 10*3/uL — AB (ref 150–400)
RBC: 3.01 MIL/uL — AB (ref 4.22–5.81)
RDW: 14.7 % (ref 11.5–15.5)
WBC: 5 10*3/uL (ref 4.0–10.5)

## 2016-11-12 LAB — ECHOCARDIOGRAM COMPLETE: Weight: 2971.2 oz

## 2016-11-12 LAB — URINE CULTURE
Culture: NO GROWTH
Special Requests: NORMAL

## 2016-11-12 MED ORDER — MUPIROCIN 2 % EX OINT
1.0000 "application " | TOPICAL_OINTMENT | Freq: Two times a day (BID) | CUTANEOUS | Status: DC
  Administered 2016-11-12 – 2016-11-13 (×3): 1 via NASAL
  Filled 2016-11-12: qty 22

## 2016-11-12 MED ORDER — CHLORHEXIDINE GLUCONATE CLOTH 2 % EX PADS
6.0000 | MEDICATED_PAD | Freq: Every day | CUTANEOUS | Status: DC
  Administered 2016-11-13: 6 via TOPICAL

## 2016-11-12 NOTE — Progress Notes (Signed)
PROGRESS NOTE    Trevor Brock  ZOX:[id number] DOB: Jul 06, 1932 DOA: 11/10/2016 PCP: Benita Stabile, MD    Brief Narrative:  83 year old male with a history of dementia who is brought to the hospital with weakness and facial swelling. Found to have left-sided facial cellulitis and low-grade fevers. Started on intravenous antibiotics. Plan is for probable placement to nursing home for therapy. He is generally weak.   Assessment & Plan:   Principal Problem:   Facial cellulitis Active Problems:   Hyperlipidemia   Hypothyroid   Dementia   Thrombocytopenia (HCC)   Depression   Generalized weakness   Heart murmur   1. Left facial cellulitis. Slowly improving. Continue on ceftriaxone. Anticipate changing to oral antibiotics tomorrow if continued improvement. 2. Generalized weakness with dysarthria. Family feels that his speech had significantly declined since the day before admission. MRI of the brain did not show any acute infarct. Symptoms were likely related to underlying infectious process. Seen by physical therapy who recommended nursing facility placement. 3. Hyperlipidemia. Does not tolerate statins due to muscle weakness. 4. Hypothyroidism. Continue on Synthroid 5. Dementia 6. Thrombocytopenia, chronic.   DVT prophylaxis: SCDs Code Status: DNR Family Communication: discussed with wife and son at the bedside Disposition Plan: Discharge to skilled nursing facility in improved   Consultants:     Procedures:     Antimicrobials:   Rocephin 5/15>>   Subjective: Family feels his speech is improving. Overall swelling and erythema on the right side of face is also improving.  Objective: Vitals:   11/11/16 1126 11/11/16 2100 11/12/16 0600 11/12/16 1437  BP:  131/76 (!) 113/43 (!) 105/46  Pulse:  80 72 77  Resp:  18 18 18   Temp: 99.4 F (37.4 C) 98.8 F (37.1 C) 98.1 F (36.7 C) 98.9 F (37.2 C)  TempSrc: Axillary Oral Oral Oral  SpO2:  99% 95% 97%  Weight:         Intake/Output Summary (Last 24 hours) at 11/12/16 1730 Last data filed at 11/12/16 0900  Gross per 24 hour  Intake              995 ml  Output                0 ml  Net              995 ml   Filed Weights   11/10/16 2013 11/11/16 0034  Weight: 90.7 kg (200 lb) 84.2 kg (185 lb 11.2 oz)    Examination:  General exam: Appears calm and comfortable  Respiratory system: clear bilaterally. Respiratory effort normal. Cardiovascular system: S1 & S2 heard, RRR. No JVD, murmurs, rubs, gallops or clicks. No pedal edema. Gastrointestinal system: Abdomen is nondistended, soft and nontender. No organomegaly or masses felt. Normal bowel sounds heard. Central nervous system: confused. No focal deficits. Speech is better today Extremities: Symmetric power. Skin: erythema and swelling on right side of face is improving Psychiatry: pleasant, confused    Data Reviewed: I have personally reviewed following labs and imaging studies  CBC:  Recent Labs Lab 11/10/16 2023 11/11/16 0540 11/12/16 0432  WBC 6.7 5.5 5.0  NEUTROABS 3.7 3.0 2.8  HGB 12.1* 11.0* 10.0*  HCT 36.1* 32.0* 30.1*  MCV 99.2 99.4 100.0  PLT 66* 59* 49*   Basic Metabolic Panel:  Recent Labs Lab 11/10/16 2023 11/11/16 0540  NA 138 136  K 3.9 3.6  CL 105 108  CO2 26 24  GLUCOSE 96 98  BUN 13  14  CREATININE 1.03 0.89  CALCIUM 9.0 8.1*   GFR: CrCl cannot be calculated (Unknown ideal weight.). Liver Function Tests:  Recent Labs Lab 11/10/16 2023  AST 44*  ALT 27  ALKPHOS 145*  BILITOT 1.0  PROT 6.1*  ALBUMIN 3.2*   No results for input(s): LIPASE, AMYLASE in the last 168 hours. No results for input(s): AMMONIA in the last 168 hours. Coagulation Profile:  Recent Labs Lab 11/10/16 2022  INR 1.03   Cardiac Enzymes:  Recent Labs Lab 11/10/16 2023  TROPONINI <0.03   BNP (last 3 results) No results for input(s): PROBNP in the last 8760 hours. HbA1C: No results for input(s): HGBA1C in the  last 72 hours. CBG: No results for input(s): GLUCAP in the last 168 hours. Lipid Profile: No results for input(s): CHOL, HDL, LDLCALC, TRIG, CHOLHDL, LDLDIRECT in the last 72 hours. Thyroid Function Tests:  Recent Labs  11/10/16 0630  TSH 1.217   Anemia Panel:  Recent Labs  11/10/16 2024  VITAMINB12 575   Sepsis Labs:  Recent Labs Lab 11/10/16 2031  LATICACIDVEN 2.35*    Recent Results (from the past 240 hour(s))  Urine culture     Status: None   Collection Time: 11/10/16  8:09 PM  Result Value Ref Range Status   Specimen Description URINE, CLEAN CATCH  Final   Special Requests unknown Normal  Final   Culture   Final    NO GROWTH Performed at [facility], [address] [place], [place] [address]    Report Status 11/12/2016 FINAL  Final  Blood culture (routine x 2)     Status: None (Preliminary result)   Collection Time: 11/10/16  8:23 PM  Result Value Ref Range Status   Specimen Description RIGHT ANTECUBITAL  Final   Special Requests   Final    BOTTLES DRAWN AEROBIC AND ANAEROBIC Blood Culture adequate volume   Culture NO GROWTH 2 DAYS  Final   Report Status PENDING  Incomplete  Blood culture (routine x 2)     Status: None (Preliminary result)   Collection Time: 11/10/16  8:23 PM  Result Value Ref Range Status   Specimen Description BLOOD LEFT HAND  Final   Special Requests   Final    BOTTLES DRAWN AEROBIC AND ANAEROBIC Blood Culture adequate volume   Culture NO GROWTH 2 DAYS  Final   Report Status PENDING  Incomplete  MRSA PCR Screening     Status: Abnormal   Collection Time: 11/11/16  1:00 AM  Result Value Ref Range Status   MRSA by PCR POSITIVE (A) NEGATIVE Final    Comment:        The GeneXpert MRSA Assay (FDA approved for NASAL specimens only), is one component of a comprehensive MRSA colonization surveillance program. It is not intended to diagnose MRSA infection nor to guide or monitor treatment for MRSA infections. RESULT CALLED TO,  READ BACK BY AND VERIFIED WITH: BIVENS L. AT 0850A ON [id number] BY THOMPSON S.          Radiology Studies: Ct Head Wo Contrast  Result Date: 11/10/2016 CLINICAL DATA:  Unable to ambulate, LEFT facial swelling and LEFT eye droop. History of dementia, hyperlipidemia. EXAM: CT HEAD WITHOUT CONTRAST TECHNIQUE: Contiguous axial images were obtained from the base of the skull through the vertex without intravenous contrast. COMPARISON:  CT HEAD October 16, 2015 and MRI of the head September 29, 2015 FINDINGS: BRAIN: No intraparenchymal hemorrhage, mass effect nor midline shift. The ventricles and  sulci are normal for age. Patchy supratentorial white matter hypodensities less than expected for patient's age, though non-specific are most compatible with chronic small vessel ischemic disease. Old small RIGHT cerebellar infarcts. No acute large vascular territory infarcts. No abnormal extra-axial fluid collections. Basal cisterns are patent. VASCULAR: Mild calcific atherosclerosis of the carotid siphons. SKULL: No skull fracture. No significant scalp soft tissue swelling. SINUSES/ORBITS: The mastoid air-cells and included paranasal sinuses are well-aerated.The included ocular globes and orbital contents are non-suspicious. Status post RIGHT scleral banding. OTHER: None. IMPRESSION: No acute intracranial process. Stable examination: Old small RIGHT cerebellar infarcts, otherwise negative CT HEAD for age. Critical Value/emergent results were called by telephone at the time of interpretation on 11/10/2016 at 8:52 pm to Dr. Gerhard Munch , who verbally acknowledged these results. Electronically Signed   By: Awilda Metro M.D.   On: 11/10/2016 20:53   Mr Brain Wo Contrast  Result Date: 11/11/2016 CLINICAL DATA:  Initial evaluation for dysarthria, left-sided facial swelling and droop. EXAM: MRI HEAD WITHOUT CONTRAST TECHNIQUE: Multiplanar, multiecho pulse sequences of the brain and surrounding structures were obtained  without intravenous contrast. COMPARISON:  Prior CT from 11/10/2016. Comparison also made with previous MRI from 09/29/2015. FINDINGS: Brain: Study somewhat degraded by motion artifact. Diffuse prominence of the CSF containing spaces is compatible with generalized cerebral atrophy. Mild for age chronic microvascular ischemic disease present within the periventricular white matter. Remote right cerebellar infarcts noted. No abnormal foci of restricted diffusion to suggest acute or subacute ischemia. Gray-white matter differentiation maintained. No other areas of chronic infarction identified. Single punctate focus of susceptibility fact noted within the left parietal lobe, likely a small chronic microhemorrhage. No other evidence for acute or chronic intracranial hemorrhage. No mass lesion, midline shift or mass effect. Mild diffuse ventricular prominence related to global parenchymal volume loss of hydrocephalus. No extra-axial fluid collection. Major dural sinuses are grossly patent. Incidental note made of a percent the sella. Suprasellar region normal. Midline structures intact. Vascular: Major intracranial vascular flow voids are maintained. Vertebrobasilar system diminutive. Skull and upper cervical spine: Craniocervical junction within normal limits. Degenerative changes about the C1-2 articulation without significant stenosis. Visualized upper cervical spine otherwise unremarkable. Bone marrow signal intensity within normal limits. No scalp soft tissue abnormality. Sinuses/Orbits: Globes and orbital soft tissues within normal limits. Postsurgical changes noted about the right globe. Mild scattered mucosal thickening within the ethmoidal air cells and maxillary sinuses. Paranasal sinuses are otherwise clear. Trace bilateral mastoid effusions noted. Inner ear structures grossly normal. Other: None. IMPRESSION: 1. No acute intracranial process identified. 2. Generalized age-related cerebral atrophy with mild  chronic small vessel ischemic disease. Small remote right cerebellar infarcts. Electronically Signed   By: Rise Mu M.D.   On: 11/11/2016 15:12   Dg Chest Port 1 View  Result Date: 11/10/2016 CLINICAL DATA:  Weakness EXAM: PORTABLE CHEST 1 VIEW COMPARISON:  10/16/2015 chest radiograph. FINDINGS: Stable cardiomediastinal silhouette with top-normal heart size. No pneumothorax. No pleural effusion. Lungs appear clear, with no acute consolidative airspace disease and no pulmonary edema. IMPRESSION: No active disease. Electronically Signed   By: Delbert Phenix M.D.   On: 11/10/2016 20:42        Scheduled Meds: . [START ON 11/13/2016] Chlorhexidine Gluconate Cloth  6 each Topical Q0600  . levothyroxine  112 mcg Oral QAC breakfast  . mupirocin ointment  1 application Nasal BID  . pantoprazole  40 mg Oral Daily  . sertraline  100 mg Oral Daily   Continuous Infusions: .  sodium chloride 75 mL/hr at 11/11/16 2017  . cefTRIAXone (ROCEPHIN)  IV Stopped (11/11/16 2304)     LOS: 2 days    Time spent:    MEMON,JEHANZEB, MD [facility] Pager [phone]  If 7PM-7AM, please contact night-coverage [email or web address] Password [email or web address] 11/12/2016, 5:30 PM

## 2016-11-12 NOTE — Clinical Social Work Note (Signed)
LCSW spoke with [facility] Lynnea FerrierKerri advised that due to patient's need for long term care they did make a bed offer. She did state that she and the DON would discuss patient again.    LCSW discussed with Mrs. Zeitler and son, [name], that [facility] and that [facility] was currently not taking new admissions.   Family advised that they would like to be considered at [facility] and [facility].  LCSW faxed clinicals to additional facilities.    Hanae Waiters, Juleen ChinaHeather D, LCSW

## 2016-11-13 ENCOUNTER — Encounter (HOSPITAL_COMMUNITY): Payer: Self-pay

## 2016-11-13 DIAGNOSIS — R293 Abnormal posture: Secondary | ICD-10-CM | POA: Diagnosis not present

## 2016-11-13 DIAGNOSIS — R471 Dysarthria and anarthria: Secondary | ICD-10-CM | POA: Diagnosis not present

## 2016-11-13 DIAGNOSIS — D518 Other vitamin B12 deficiency anemias: Secondary | ICD-10-CM | POA: Diagnosis not present

## 2016-11-13 DIAGNOSIS — Z79899 Other long term (current) drug therapy: Secondary | ICD-10-CM | POA: Diagnosis not present

## 2016-11-13 DIAGNOSIS — M6281 Muscle weakness (generalized): Secondary | ICD-10-CM | POA: Diagnosis not present

## 2016-11-13 DIAGNOSIS — E039 Hypothyroidism, unspecified: Secondary | ICD-10-CM | POA: Diagnosis not present

## 2016-11-13 DIAGNOSIS — Z8639 Personal history of other endocrine, nutritional and metabolic disease: Secondary | ICD-10-CM | POA: Diagnosis not present

## 2016-11-13 DIAGNOSIS — E782 Mixed hyperlipidemia: Secondary | ICD-10-CM | POA: Diagnosis not present

## 2016-11-13 DIAGNOSIS — R1310 Dysphagia, unspecified: Secondary | ICD-10-CM | POA: Diagnosis not present

## 2016-11-13 DIAGNOSIS — D696 Thrombocytopenia, unspecified: Secondary | ICD-10-CM | POA: Diagnosis not present

## 2016-11-13 DIAGNOSIS — G301 Alzheimer's disease with late onset: Secondary | ICD-10-CM | POA: Diagnosis not present

## 2016-11-13 DIAGNOSIS — E119 Type 2 diabetes mellitus without complications: Secondary | ICD-10-CM | POA: Diagnosis not present

## 2016-11-13 DIAGNOSIS — E785 Hyperlipidemia, unspecified: Secondary | ICD-10-CM | POA: Diagnosis not present

## 2016-11-13 DIAGNOSIS — R531 Weakness: Secondary | ICD-10-CM | POA: Diagnosis not present

## 2016-11-13 DIAGNOSIS — R2681 Unsteadiness on feet: Secondary | ICD-10-CM | POA: Diagnosis not present

## 2016-11-13 DIAGNOSIS — R011 Cardiac murmur, unspecified: Secondary | ICD-10-CM | POA: Diagnosis not present

## 2016-11-13 DIAGNOSIS — L03211 Cellulitis of face: Secondary | ICD-10-CM | POA: Diagnosis not present

## 2016-11-13 MED ORDER — AMOXICILLIN-POT CLAVULANATE 875-125 MG PO TABS: 1.0000 | ORAL_TABLET | Freq: Two times a day (BID) | ORAL | 0 refills | Status: AC

## 2016-11-13 NOTE — Clinical Social Work Placement (Signed)
   CLINICAL SOCIAL WORK PLACEMENT  NOTE  Date:  11/13/2016  Patient Details  Name: Trevor Brock MRN: [id number] Date of Birth: 26-Mar-1933  Clinical Social Work is seeking post-discharge placement for this patient at the Skilled  Nursing Facility level of care (*CSW will initial, date and re-position this form in  chart as items are completed):  Yes   Patient/family provided with [place] Clinical Social Work Department's list of facilities offering this level of care within the geographic area requested by the patient (or if unable, by the patient's family).  Yes   Patient/family informed of their freedom to choose among providers that offer the needed level of care, that participate in Medicare, Medicaid or managed care program needed by the patient, have an available bed and are willing to accept the patient.  Yes   Patient/family informed of 's ownership interest in [facility] and [facility], as well as of the fact that they are under no obligation to receive care at these facilities.  PASRR submitted to EDS on       PASRR number received on       Existing PASRR number confirmed on 11/11/16     FL2 transmitted to all facilities in geographic area requested by pt/family on 11/11/16     FL2 transmitted to all facilities within larger geographic area on       Patient informed that his/her managed care company has contracts with or will negotiate with certain facilities, including the following:            Patient/family informed of bed offers received.  [facility]     Physician recommends and patient chooses bed at      [facility] to be [facility] on 11/13/16.  [facility]     Patient family notified on 11/13/16 of transfer.  Name of family member notified:  Son and spouse are aware     PHYSICIAN       Additional Comment:  Patient's son and spouse  are aware of d/c. Thayer OhmChris at [facility] is aware of discharge.  LCSW signing off.   _______________________________________________ Annice NeedySettle, Meiah Zamudio D, LCSW 11/13/2016, 1:09 PM

## 2016-11-13 NOTE — Discharge Summary (Signed)
Physician Discharge Summary  Trevor Brock ZOX:[id number] DOB: 11/02/1932 DOA: 11/10/2016  PCP: [name], [name], MD  Admit date: 11/10/2016 Discharge date: 11/13/2016  Admitted From: home Disposition:  SNF  Recommendations for Outpatient Follow-up:  1. Follow up with PCP in 1-2 weeks 2. Please obtain BMP/CBC in one week  Discharge Condition: stable CODE STATUS: DNR Diet recommendation: Heart Healthy  Brief/Interim Summary: 83 year old male with a history of dementia who is brought to the hospital with weakness and facial swelling. Found to have left-sided facial cellulitis and low-grade fevers. Started on intravenous antibiotics.  He is generally weak.  Discharge Diagnoses:  Principal Problem:   Facial cellulitis Active Problems:   Hyperlipidemia   Hypothyroid   Dementia   Thrombocytopenia (HCC)   Depression   Generalized weakness   Heart murmur  1. Left facial cellulitis. treated with ceftriaxone with improvement. Fevers have resolved. He will be transitioned to augmentin to complete his course 2. Generalized weakness with dysarthria. Family felt that his speech had significantly declined since the day before admission. MRI of the brain did not show any acute infarct. Symptoms were likely related to underlying infectious process. Seen by physical therapy who recommended nursing facility placement. Overall speech has improved. 3. Hyperlipidemia. Does not tolerate statins due to muscle weakness. 4. Hypothyroidism. Continue on Synthroid 5. Dementia 6. Thrombocytopenia, chronic.  Discharge Instructions  Discharge Instructions    Diet - low sodium heart healthy    Complete by:  As directed    Increase activity slowly    Complete by:  As directed      Allergies as of 11/13/2016      Reactions   Statins    Possible cause of weakness      Medication List    TAKE these medications   amoxicillin-clavulanate 875-125 MG tablet Commonly known as:  AUGMENTIN Take 1 tablet by  mouth 2 (two) times daily.   levothyroxine 112 MCG tablet Commonly known as:  SYNTHROID, LEVOTHROID Take one tablet by mouth 30 minutes before breakfast once daily for thyroid   sertraline 50 MG tablet Commonly known as:  ZOLOFT Take 1 tablet (50 mg total) by mouth daily. What changed:  how much to take       Allergies  Allergen Reactions  . Statins     Possible cause of weakness    Consultations:     Procedures/Studies: Ct Head Wo Contrast  Result Date: 11/10/2016 CLINICAL DATA:  Unable to ambulate, LEFT facial swelling and LEFT eye droop. History of dementia, hyperlipidemia. EXAM: CT HEAD WITHOUT CONTRAST TECHNIQUE: Contiguous axial images were obtained from the base of the skull through the vertex without intravenous contrast. COMPARISON:  CT HEAD October 16, 2015 and MRI of the head September 29, 2015 FINDINGS: BRAIN: No intraparenchymal hemorrhage, mass effect nor midline shift. The ventricles and sulci are normal for age. Patchy supratentorial white matter hypodensities less than expected for patient's age, though non-specific are most compatible with chronic small vessel ischemic disease. Old small RIGHT cerebellar infarcts. No acute large vascular territory infarcts. No abnormal extra-axial fluid collections. Basal cisterns are patent. VASCULAR: Mild calcific atherosclerosis of the carotid siphons. SKULL: No skull fracture. No significant scalp soft tissue swelling. SINUSES/ORBITS: The mastoid air-cells and included paranasal sinuses are well-aerated.The included ocular globes and orbital contents are non-suspicious. Status post RIGHT scleral banding. OTHER: None. IMPRESSION: No acute intracranial process. Stable examination: Old small RIGHT cerebellar infarcts, otherwise negative CT HEAD for age. Critical Value/emergent results were called by telephone  at the time of interpretation on 11/10/2016 at 8:52 pm to Dr. Gerhard Munch , who verbally acknowledged these results. Electronically  Signed   By: Awilda Metro M.D.   On: 11/10/2016 20:53   Mr Brain Wo Contrast  Result Date: 11/11/2016 CLINICAL DATA:  Initial evaluation for dysarthria, left-sided facial swelling and droop. EXAM: MRI HEAD WITHOUT CONTRAST TECHNIQUE: Multiplanar, multiecho pulse sequences of the brain and surrounding structures were obtained without intravenous contrast. COMPARISON:  Prior CT from 11/10/2016. Comparison also made with previous MRI from 09/29/2015. FINDINGS: Brain: Study somewhat degraded by motion artifact. Diffuse prominence of the CSF containing spaces is compatible with generalized cerebral atrophy. Mild for age chronic microvascular ischemic disease present within the periventricular white matter. Remote right cerebellar infarcts noted. No abnormal foci of restricted diffusion to suggest acute or subacute ischemia. Gray-white matter differentiation maintained. No other areas of chronic infarction identified. Single punctate focus of susceptibility fact noted within the left parietal lobe, likely a small chronic microhemorrhage. No other evidence for acute or chronic intracranial hemorrhage. No mass lesion, midline shift or mass effect. Mild diffuse ventricular prominence related to global parenchymal volume loss of hydrocephalus. No extra-axial fluid collection. Major dural sinuses are grossly patent. Incidental note made of a percent the sella. Suprasellar region normal. Midline structures intact. Vascular: Major intracranial vascular flow voids are maintained. Vertebrobasilar system diminutive. Skull and upper cervical spine: Craniocervical junction within normal limits. Degenerative changes about the C1-2 articulation without significant stenosis. Visualized upper cervical spine otherwise unremarkable. Bone marrow signal intensity within normal limits. No scalp soft tissue abnormality. Sinuses/Orbits: Globes and orbital soft tissues within normal limits. Postsurgical changes noted about the right  globe. Mild scattered mucosal thickening within the ethmoidal air cells and maxillary sinuses. Paranasal sinuses are otherwise clear. Trace bilateral mastoid effusions noted. Inner ear structures grossly normal. Other: None. IMPRESSION: 1. No acute intracranial process identified. 2. Generalized age-related cerebral atrophy with mild chronic small vessel ischemic disease. Small remote right cerebellar infarcts. Electronically Signed   By: Rise Mu M.D.   On: 11/11/2016 15:12   Dg Chest Port 1 View  Result Date: 11/10/2016 CLINICAL DATA:  Weakness EXAM: PORTABLE CHEST 1 VIEW COMPARISON:  10/16/2015 chest radiograph. FINDINGS: Stable cardiomediastinal silhouette with top-normal heart size. No pneumothorax. No pleural effusion. Lungs appear clear, with no acute consolidative airspace disease and no pulmonary edema. IMPRESSION: No active disease. Electronically Signed   By: Delbert Phenix M.D.   On: 11/10/2016 20:42       Subjective: Feeling better. Left facial swelling improving  Discharge Exam: Vitals:   11/12/16 2100 11/13/16 0500  BP: (!) 126/54 (!) 145/66  Pulse: 71 70  Resp: 18 18  Temp: 98.2 F (36.8 C) 98 F (36.7 C)   Vitals:   11/12/16 0600 11/12/16 1437 11/12/16 2100 11/13/16 0500  BP: (!) 113/43 (!) 105/46 (!) 126/54 (!) 145/66  Pulse: 72 77 71 70  Resp: 18 18 18 18   Temp: 98.1 F (36.7 C) 98.9 F (37.2 C) 98.2 F (36.8 C) 98 F (36.7 C)  TempSrc: Oral Oral Oral Oral  SpO2: 95% 97% 99% 100%  Weight:        General: Pt is alert, awake, not in acute distress Cardiovascular: RRR, S1/S2 +, no rubs, no gallops Respiratory: CTA bilaterally, no wheezing, no rhonchi Abdominal: Soft, NT, ND, bowel sounds + Extremities: no edema, no cyanosis    The results of significant diagnostics from this hospitalization (including imaging, microbiology, ancillary and laboratory) are  listed below for reference.     Microbiology: Recent Results (from the past 240 hour(s))   Urine culture     Status: None   Collection Time: 11/10/16  8:09 PM  Result Value Ref Range Status   Specimen Description URINE, CLEAN CATCH  Final   Special Requests unknown Normal  Final   Culture   Final    NO GROWTH Performed at [facility], [address] [place], [place] [address]    Report Status 11/12/2016 FINAL  Final  Blood culture (routine x 2)     Status: None (Preliminary result)   Collection Time: 11/10/16  8:23 PM  Result Value Ref Range Status   Specimen Description RIGHT ANTECUBITAL  Final   Special Requests   Final    BOTTLES DRAWN AEROBIC AND ANAEROBIC Blood Culture adequate volume   Culture NO GROWTH 3 DAYS  Final   Report Status PENDING  Incomplete  Blood culture (routine x 2)     Status: None (Preliminary result)   Collection Time: 11/10/16  8:23 PM  Result Value Ref Range Status   Specimen Description BLOOD LEFT HAND  Final   Special Requests   Final    BOTTLES DRAWN AEROBIC AND ANAEROBIC Blood Culture adequate volume   Culture NO GROWTH 3 DAYS  Final   Report Status PENDING  Incomplete  MRSA PCR Screening     Status: Abnormal   Collection Time: 11/11/16  1:00 AM  Result Value Ref Range Status   MRSA by PCR POSITIVE (A) NEGATIVE Final    Comment:        The GeneXpert MRSA Assay (FDA approved for NASAL specimens only), is one component of a comprehensive MRSA colonization surveillance program. It is not intended to diagnose MRSA infection nor to guide or monitor treatment for MRSA infections. RESULT CALLED TO, READ BACK BY AND VERIFIED WITH: BIVENS L. AT 0850A ON [id number] BY THOMPSON S.      Labs: BNP (last 3 results) No results for input(s): BNP in the last 8760 hours. Basic Metabolic Panel:  Recent Labs Lab 11/10/16 2023 11/11/16 0540  NA 138 136  K 3.9 3.6  CL 105 108  CO2 26 24  GLUCOSE 96 98  BUN 13 14  CREATININE 1.03 0.89  CALCIUM 9.0 8.1*   Liver Function Tests:  Recent Labs Lab 11/10/16 2023  AST 44*  ALT 27   ALKPHOS 145*  BILITOT 1.0  PROT 6.1*  ALBUMIN 3.2*   No results for input(s): LIPASE, AMYLASE in the last 168 hours. No results for input(s): AMMONIA in the last 168 hours. CBC:  Recent Labs Lab 11/10/16 2023 11/11/16 0540 11/12/16 0432  WBC 6.7 5.5 5.0  NEUTROABS 3.7 3.0 2.8  HGB 12.1* 11.0* 10.0*  HCT 36.1* 32.0* 30.1*  MCV 99.2 99.4 100.0  PLT 66* 59* 49*   Cardiac Enzymes:  Recent Labs Lab 11/10/16 2023  TROPONINI <0.03   BNP: Invalid input(s): POCBNP CBG: No results for input(s): GLUCAP in the last 168 hours. D-Dimer No results for input(s): DDIMER in the last 72 hours. Hgb A1c No results for input(s): HGBA1C in the last 72 hours. Lipid Profile No results for input(s): CHOL, HDL, LDLCALC, TRIG, CHOLHDL, LDLDIRECT in the last 72 hours. Thyroid function studies No results for input(s): TSH, T4TOTAL, T3FREE, THYROIDAB in the last 72 hours.  Invalid input(s): FREET3 Anemia work up  Recent Labs  11/10/16 2024  VITAMINB12 575   Urinalysis    Component Value Date/Time  COLORURINE YELLOW 11/10/2016 2009   APPEARANCEUR CLEAR 11/10/2016 2009   LABSPEC 1.020 11/10/2016 2009   PHURINE 6.0 11/10/2016 2009   GLUCOSEU NEGATIVE 11/10/2016 2009   HGBUR TRACE (A) 11/10/2016 2009   BILIRUBINUR NEGATIVE 11/10/2016 2009   KETONESUR NEGATIVE 11/10/2016 2009   PROTEINUR NEGATIVE 11/10/2016 2009   NITRITE NEGATIVE 11/10/2016 2009   LEUKOCYTESUR NEGATIVE 11/10/2016 2009   Sepsis Labs Invalid input(s): PROCALCITONIN,  WBC,  LACTICIDVEN Microbiology Recent Results (from the past 240 hour(s))  Urine culture     Status: None   Collection Time: 11/10/16  8:09 PM  Result Value Ref Range Status   Specimen Description URINE, CLEAN CATCH  Final   Special Requests unknown Normal  Final   Culture   Final    NO GROWTH Performed at [facility], [address], [place], [place] [address]    Report Status 11/12/2016 FINAL  Final  Blood culture (routine x 2)      Status: None (Preliminary result)   Collection Time: 11/10/16  8:23 PM  Result Value Ref Range Status   Specimen Description RIGHT ANTECUBITAL  Final   Special Requests   Final    BOTTLES DRAWN AEROBIC AND ANAEROBIC Blood Culture adequate volume   Culture NO GROWTH 3 DAYS  Final   Report Status PENDING  Incomplete  Blood culture (routine x 2)     Status: None (Preliminary result)   Collection Time: 11/10/16  8:23 PM  Result Value Ref Range Status   Specimen Description BLOOD LEFT HAND  Final   Special Requests   Final    BOTTLES DRAWN AEROBIC AND ANAEROBIC Blood Culture adequate volume   Culture NO GROWTH 3 DAYS  Final   Report Status PENDING  Incomplete  MRSA PCR Screening     Status: Abnormal   Collection Time: 11/11/16  1:00 AM  Result Value Ref Range Status   MRSA by PCR POSITIVE (A) NEGATIVE Final    Comment:        The GeneXpert MRSA Assay (FDA approved for NASAL specimens only), is one component of a comprehensive MRSA colonization surveillance program. It is not intended to diagnose MRSA infection nor to guide or monitor treatment for MRSA infections. RESULT CALLED TO, READ BACK BY AND VERIFIED WITH: BIVENS L. AT 0850A ON [id number] BY THOMPSON S.      Time coordinating discharge: Over 30 minutes  SIGNED:   Erick Blinks, MD  [facility] 11/13/2016, 10:27 AM Pager   If 7PM-7AM, please contact night-coverage [email or web address] Password [email or web address]

## 2016-11-13 NOTE — Care Management Note (Signed)
Case Management Note  Patient Details  Name: Trevor Brock MRN: [id number] Date of Birth: [birth year]     Expected Discharge Date:  11/13/16               Expected Discharge Plan:  Skilled Nursing Facility  In-House Referral:  Clinical Social Work  Discharge planning Services  CM Consult  Post Acute Care Choice:    Choice offered to:  NA  DME Arranged:    DME Agency:     HH Arranged:    HH Agency:     Status of Service:  Completed, signed off  If discussed at [employer] Meetings, dates discussed:    Additional Comments: Patient discharging to SNF today. Trevor Brock, Trevor OilerSharley Diane, RN 11/13/2016, 12:12 PM

## 2016-11-13 NOTE — Care Management Important Message (Signed)
Important Message  Patient Details  Name: Trevor Brock MRN: [id number] Date of Birth: 05-24-1933   Medicare Important Message Given:  Yes    Thor Nannini, Chrystine OilerSharley Diane, RN 11/13/2016, 8:45 AM

## 2016-11-15 LAB — CULTURE, BLOOD (ROUTINE X 2)
Culture: NO GROWTH
Culture: NO GROWTH
SPECIAL REQUESTS: ADEQUATE
Special Requests: ADEQUATE

## 2016-11-19 DIAGNOSIS — D518 Other vitamin B12 deficiency anemias: Secondary | ICD-10-CM | POA: Diagnosis not present

## 2016-11-19 DIAGNOSIS — E782 Mixed hyperlipidemia: Secondary | ICD-10-CM | POA: Diagnosis not present

## 2016-11-19 DIAGNOSIS — E119 Type 2 diabetes mellitus without complications: Secondary | ICD-10-CM | POA: Diagnosis not present

## 2016-11-19 DIAGNOSIS — Z79899 Other long term (current) drug therapy: Secondary | ICD-10-CM | POA: Diagnosis not present

## 2016-11-24 DIAGNOSIS — G301 Alzheimer's disease with late onset: Secondary | ICD-10-CM | POA: Diagnosis not present

## 2016-11-24 DIAGNOSIS — Z8639 Personal history of other endocrine, nutritional and metabolic disease: Secondary | ICD-10-CM | POA: Diagnosis not present

## 2016-12-03 ENCOUNTER — Emergency Department (HOSPITAL_COMMUNITY): Payer: Medicare Other

## 2016-12-03 ENCOUNTER — Encounter (HOSPITAL_COMMUNITY): Payer: Self-pay

## 2016-12-03 ENCOUNTER — Emergency Department (HOSPITAL_COMMUNITY)
Admission: EM | Admit: 2016-12-03 | Discharge: 2016-12-04 | Disposition: A | Discharge status: Home / Self Care | Payer: Medicare Other | Attending: Emergency Medicine | Admitting: Emergency Medicine

## 2016-12-03 DIAGNOSIS — M25531 Pain in right wrist: Secondary | ICD-10-CM | POA: Diagnosis not present

## 2016-12-03 DIAGNOSIS — S01111A Laceration without foreign body of right eyelid and periocular area, initial encounter: Secondary | ICD-10-CM

## 2016-12-03 DIAGNOSIS — Y939 Activity, unspecified: Secondary | ICD-10-CM | POA: Diagnosis not present

## 2016-12-03 DIAGNOSIS — S6291XA Unspecified fracture of right wrist and hand, initial encounter for closed fracture: Secondary | ICD-10-CM | POA: Insufficient documentation

## 2016-12-03 DIAGNOSIS — S199XXA Unspecified injury of neck, initial encounter: Secondary | ICD-10-CM | POA: Diagnosis not present

## 2016-12-03 DIAGNOSIS — W19XXXA Unspecified fall, initial encounter: Secondary | ICD-10-CM

## 2016-12-03 DIAGNOSIS — W06XXXA Fall from bed, initial encounter: Secondary | ICD-10-CM | POA: Diagnosis not present

## 2016-12-03 DIAGNOSIS — S0003XA Contusion of scalp, initial encounter: Secondary | ICD-10-CM | POA: Diagnosis not present

## 2016-12-03 DIAGNOSIS — Y929 Unspecified place or not applicable: Secondary | ICD-10-CM | POA: Diagnosis not present

## 2016-12-03 DIAGNOSIS — S62101A Fracture of unspecified carpal bone, right wrist, initial encounter for closed fracture: Secondary | ICD-10-CM

## 2016-12-03 DIAGNOSIS — S0990XA Unspecified injury of head, initial encounter: Secondary | ICD-10-CM | POA: Insufficient documentation

## 2016-12-03 DIAGNOSIS — Y999 Unspecified external cause status: Secondary | ICD-10-CM | POA: Diagnosis not present

## 2016-12-03 DIAGNOSIS — S064X1A Epidural hemorrhage with loss of consciousness of 30 minutes or less, initial encounter: Secondary | ICD-10-CM | POA: Diagnosis not present

## 2016-12-03 DIAGNOSIS — S6991XA Unspecified injury of right wrist, hand and finger(s), initial encounter: Secondary | ICD-10-CM | POA: Diagnosis present

## 2016-12-03 DIAGNOSIS — T148XXA Other injury of unspecified body region, initial encounter: Secondary | ICD-10-CM | POA: Diagnosis not present

## 2016-12-03 MED ORDER — LIDOCAINE-EPINEPHRINE (PF) 1 %-1:200000 IJ SOLN
INTRAMUSCULAR | Status: AC
  Administered 2016-12-04: 01:00:00
  Filled 2016-12-03: qty 30

## 2016-12-03 MED ORDER — LIDOCAINE-EPINEPHRINE-TETRACAINE (LET) SOLUTION
3.0000 mL | Freq: Once | NASAL | Status: AC
  Administered 2016-12-03: 23:00:00 3 mL via TOPICAL
  Filled 2016-12-03: qty 3

## 2016-12-03 NOTE — ED Triage Notes (Signed)
[facility] [place].  Pt apparently fell out of the bed onto the floor, has laceration to right eyebrow area with a hematoma present, unknown if loc.  Pt's normal is dementia/confusion

## 2016-12-04 DIAGNOSIS — R279 Unspecified lack of coordination: Secondary | ICD-10-CM | POA: Diagnosis not present

## 2016-12-04 DIAGNOSIS — S6291XA Unspecified fracture of right wrist and hand, initial encounter for closed fracture: Secondary | ICD-10-CM | POA: Diagnosis not present

## 2016-12-04 DIAGNOSIS — Z7401 Bed confinement status: Secondary | ICD-10-CM | POA: Diagnosis not present

## 2016-12-04 NOTE — ED Provider Notes (Signed)
[facility]-EM[facility]Y DEPT Provider Note   CSN: [id number] Arrival date & time: 12/03/16  2201     History   Chief Complaint Chief Complaint  Patient presents with  . Fall    Laceration    HPI Trevor Brock is a 83 y.o. male with moderate dementia and requiring assistance using walker for mobility, fell prior to arrival causing laceration to his forehead.  There was no loc, no vomiting and patient has had no changes in his normal level of dementia since the event per family at bedside.  He has had no treatment prior to arrival.  The history is provided by the nursing home.    Past Medical History:  Diagnosis Date  . Assistance needed for mobility    walks with walker  . Dementia   . Depression   . Hyperlipidemia    Statin intolerance as leg weakness  . Hypothyroid   . Mild memory disturbance   . Thrombocytopenia [facility])     Patient Active Problem List   Diagnosis Date Noted  . Generalized weakness 11/11/2016  . Heart murmur 11/11/2016  . Facial cellulitis 11/10/2016  . MRSA carrier 10/22/2015  . Cellulitis and abscess 10/17/2015  . Thrombocytopenia (HCC) 10/17/2015  . Depression 10/17/2015  . Sepsis, unspecified organism (HCC) 10/17/2015  . Malnutrition of moderate degree 10/17/2015  . Anisocoria 10/16/2015  . Pancytopenia (HCC) 10/07/2015  . Syncope 09/28/2015  . Dementia 09/28/2015  . Assistance needed for mobility   . Hyperlipidemia   . Hypothyroid     Past Surgical History:  Procedure Laterality Date  . CHOLECYSTECTOMY    . RETINAL DETACHMENT SURGERY Right   . TONSILLECTOMY         Home Medications    Prior to Admission medications   Medication Sig Start Date End Date Taking? Authorizing Provider  levothyroxine (SYNTHROID, LEVOTHROID) 112 MCG tablet Take one tablet by mouth 30 minutes before breakfast once daily for thyroid 09/15/15   [provider]  sertraline (ZOLOFT) 50 MG tablet Take 1 tablet (50 mg total) by mouth daily. Patient taking  differently: Take 100 mg by mouth daily.  10/03/15   Dorothea Ogle, MD    Family History Family History  Problem Relation Age of Onset  . Cancer Mother        Diffuse metastases of unknown primary  . Cancer Sister        Breast  . Cancer Brother        Gastrointestinal  . Stroke Neg Hx   . Diabetes Neg Hx   . Heart disease Neg Hx     Social History Social History  Substance Use Topics  . Smoking status: Never Smoker  . Smokeless tobacco: Never Used  . Alcohol use No     Allergies   Statins   Review of Systems Review of Systems  Unable to perform ROS: Dementia     Physical Exam Updated Vital Signs BP (!) 121/51 (BP Location: Left Arm)   Pulse 73   Temp 98.3 F (36.8 C) (Oral)   Resp 18   SpO2 100%   Physical Exam  Constitutional: He appears well-developed and well-nourished. He is active. He is easily aroused. No distress.  HENT:  Head: Normocephalic.  Eyes: Conjunctivae and EOM are normal. Right eye exhibits no chemosis. Right conjunctiva is not injected. Right conjunctiva has no hemorrhage.  Right upper lid has a 1 cm superficial laceration, surrounding bruising.  2 cm laceration through right brow.  Hemostatic.  Neck:  Normal range of motion.  Cardiovascular: Normal rate, regular rhythm, normal heart sounds and intact distal pulses.   Pulses:      Radial pulses are 2+ on the right side, and 2+ on the left side.  Less than 2 sec cap refill in fingertips.    Pulmonary/Chest: Effort normal and breath sounds normal. He has no wheezes.  Abdominal: Soft. Bowel sounds are normal. There is no tenderness.  Musculoskeletal: Normal range of motion.       Right wrist: He exhibits swelling. He exhibits no deformity.  No palpable deformity.  Neurological: He is alert and easily aroused.  Skin: Skin is warm and dry.  Superficial abrasions bilateral dorsal hands.  Mild edema right dorsal wrist.  No ttp.  Psychiatric: He has a normal mood and affect.  Nursing note and  vitals reviewed.    ED Treatments / Results  Labs (all labs ordered are listed, but only abnormal results are displayed) Labs Reviewed - No data to display  EKG  EKG Interpretation None       Radiology Dg Wrist Complete Right  Result Date: 12/03/2016 CLINICAL DATA:  Right wrist pain after fall from bed. Wrist swelling. EXAM: RIGHT WRIST - COMPLETE 3+ VIEW COMPARISON:  None. FINDINGS: Suspected triquetral fracture about the dorsal aspect of the proximal carpal row in, appreciated on the lateral view. No evidence of additional fracture. Mild radiocarpal osteoarthritis. Degenerative change at the base of the thumb. There is mild soft tissue edema. IMPRESSION: Suspect acute triquetral fracture with soft tissue edema. Electronically Signed   By: Rubye Oaks M.D.   On: 12/03/2016 23:20   Ct Head Wo Contrast  Result Date: 12/03/2016 CLINICAL DATA:  Dementia patient post fall from bed with head injury. Laceration and hematoma. EXAM: CT HEAD WITHOUT CONTRAST CT CERVICAL SPINE WITHOUT CONTRAST TECHNIQUE: Multidetector CT imaging of the head and cervical spine was performed following the standard protocol without intravenous contrast. Multiplanar CT image reconstructions of the cervical spine were also generated. COMPARISON:  Head CT 11/10/2016 FINDINGS: CT HEAD FINDINGS Brain: Stable atrophy and chronic small vessel ischemia. Unchanged remote small right cerebellar infarct. No evidence of acute infarction, hemorrhage, hydrocephalus, extra-axial collection or mass lesion/mass effect. Vascular: Atherosclerosis of skullbase vasculature without hyperdense vessel or abnormal calcification. Skull: No skull fracture.  Right frontal scalp hematoma. Sinuses/Orbits: Right periorbital hematoma. No evidence of facial bone fracture. Status post scleral banding on the right. Mastoid air cells and paranasal sinuses are well-aerated. Other: None. CT CERVICAL SPINE FINDINGS Alignment: Minimal anterolisthesis of C7 on  T1 appears degenerative. With the septa normally aligned. Lateral masses of C1 are well aligned on C2. Skull base and vertebrae: No acute fracture. Degenerative change at C1-C2. Mild endplate changes at C5-C6. Vertebral body heights are preserved. Soft tissues and spinal canal: No prevertebral fluid or swelling. No visible canal hematoma. Disc levels: Diffuse disc space narrowing is most prominent at C5-C6 and C6-C7 with endplate spurring. Multilevel facet arthropathy and multilevel neural foraminal narrowing. Upper chest: No acute abnormality. Other: Mild carotid vascular calcifications. IMPRESSION: 1. Right frontal scalp hematoma. No skull fracture or acute intracranial abnormality. Stable degree of atrophy and chronic small vessel ischemia. 2. Degenerative change in the cervical spine without acute fracture or subluxation. Electronically Signed   By: Rubye Oaks M.D.   On: 12/03/2016 23:27   Ct Cervical Spine Wo Contrast  Result Date: 12/03/2016 CLINICAL DATA:  Dementia patient post fall from bed with head injury. Laceration and hematoma. EXAM: CT HEAD  WITHOUT CONTRAST CT CERVICAL SPINE WITHOUT CONTRAST TECHNIQUE: Multidetector CT imaging of the head and cervical spine was performed following the standard protocol without intravenous contrast. Multiplanar CT image reconstructions of the cervical spine were also generated. COMPARISON:  Head CT 11/10/2016 FINDINGS: CT HEAD FINDINGS Brain: Stable atrophy and chronic small vessel ischemia. Unchanged remote small right cerebellar infarct. No evidence of acute infarction, hemorrhage, hydrocephalus, extra-axial collection or mass lesion/mass effect. Vascular: Atherosclerosis of skullbase vasculature without hyperdense vessel or abnormal calcification. Skull: No skull fracture.  Right frontal scalp hematoma. Sinuses/Orbits: Right periorbital hematoma. No evidence of facial bone fracture. Status post scleral banding on the right. Mastoid air cells and paranasal  sinuses are well-aerated. Other: None. CT CERVICAL SPINE FINDINGS Alignment: Minimal anterolisthesis of C7 on T1 appears degenerative. With the septa normally aligned. Lateral masses of C1 are well aligned on C2. Skull base and vertebrae: No acute fracture. Degenerative change at C1-C2. Mild endplate changes at C5-C6. Vertebral body heights are preserved. Soft tissues and spinal canal: No prevertebral fluid or swelling. No visible canal hematoma. Disc levels: Diffuse disc space narrowing is most prominent at C5-C6 and C6-C7 with endplate spurring. Multilevel facet arthropathy and multilevel neural foraminal narrowing. Upper chest: No acute abnormality. Other: Mild carotid vascular calcifications. IMPRESSION: 1. Right frontal scalp hematoma. No skull fracture or acute intracranial abnormality. Stable degree of atrophy and chronic small vessel ischemia. 2. Degenerative change in the cervical spine without acute fracture or subluxation. Electronically Signed   By: Rubye OaksMelanie  Ehinger M.D.   On: 12/03/2016 23:27    Procedures Procedures (including critical care time)  LACERATION REPAIR Performed by: Burgess AmorIDOL, Azaan Leask Authorized by: Burgess AmorIDOL, Lorretta Kerce Consent: Verbal consent obtained. Risks and benefits: risks, benefits and alternatives were discussed Consent given by: patient Patient identity confirmed: provided demographic data Prepped and Draped in normal sterile fashion Wound explored  Laceration Location: 3 cm total, 2 cm through right brow with an additional 1 cm laceration parallel and beneath the first across lateral right upper lid.  Laceration Length: 3 cm  No Foreign Bodies seen or palpated  Anesthesia: supraorbital nerve block  Local anesthetic: lidocaine 2% without epinephrine  Anesthetic total: 1 ml  Irrigation method: syringe Amount of cleaning: standard  Skin closure: ethilon 5-0  Number of sutures: 9  Technique: simple interupted  Patient tolerance: Patient tolerated the procedure  well with no immediate complications.   Medications Ordered in ED Medications  lidocaine-EPINEPHrine-tetracaine (LET) solution (3 mLs Topical Given 12/03/16 2316)  lidocaine-EPINEPHrine (XYLOCAINE-EPINEPHrine) 1 %-1:200000 (PF) injection (  Given by Other 12/04/16 0036)     Initial Impression / Assessment and Plan / ED Course  I have reviewed the triage vital signs and the nursing notes.  Pertinent labs & imaging results that were available during my care of the patient were reviewed by me and considered in my medical decision making (see chart for details).     Pt was placed in ulnar gutter splint, referral to ortho for further eval/management.  Splint was examined post application, pain improved,  Patient can wiggle digits, less than 3 sec cap refill.  Wound care instructions given.  Pt advised to have sutures removed in 5-7 days,  Return here sooner for any signs of infection including redness, swelling, worse pain or drainage of pus.  Pt seen by Dr. Verdie MosherLiu during this visit.   Final Clinical Impressions(s) / ED Diagnoses   Final diagnoses:  Fall, initial encounter  Eyebrow laceration, right, initial encounter  Closed fracture of right wrist,  initial encounter    New Prescriptions Discharge Medication List as of 12/04/2016  1:16 AM       Burgess Amor, PA-C 12/05/16 1429    Burgess Amor, PA-C 12/05/16 1431    Lavera Guise, MD 12/08/16 2149

## 2016-12-04 NOTE — Discharge Instructions (Signed)
Have your sutures removed in 7 days.  Keep your wound clean and dry,  Until a good scab forms - you may then wash gently twice daily with mild soap and water, but dry completely after.  Get rechecked for any sign of infection (redness,  Swelling,  Increased pain or drainage of purulent fluid).  Call Dr. Romeo AppleHarrison for a recheck of your wrist injury within the next week.  Wear the splint at all times to protect this injury.  Ice and elevation can help with pain and swelling.

## 2016-12-04 NOTE — ED Provider Notes (Addendum)
Medical screening examination/treatment/procedure(s) were conducted as a shared visit with non-physician practitioner(s) and myself.  I personally evaluated the patient during the encounter.   EKG Interpretation None      SPLINT APPLICATION Date/Time: 12:44 AM Authorized by: Lavera Guiseana Duo Jackqueline Aquilar Consent: Verbal consent obtained. Risks and benefits: risks, benefits and alternatives were discussed Consent given by: patient Splint applied by: nurse Location details: right wrist Splint type: short arm ulnar gutter splint Supplies used: orthoglass Post-procedure: The splinted body part was neurovascularly unchanged following the procedure. Patient tolerance: Patient tolerated the procedure well with no immediate complications.    83 year old male who presents after fall. History of dementia. Not on anticoagulation.  [facility] [place]. Had unwitnessed fall out of bed on to the floor. Found on floor. Possible hit head on beside table with laceration to right eyebrow. Mental status is baseline. Nonambulatory at baseline. Right eye hematoma with normal orbits and intact EOM. Laceration to right eyebrow, which is repaired. CT head and neck shows no acute traumatic injuries. Right wrist slightly swollen with skin tear. Xr wrist shows possible triquetral fracture. Will splint and give ortho follow-up. Stable for discharge home.    Lavera GuiseLiu, Ajai Terhaar Duo, MD 12/04/16 0013    Lavera GuiseLiu, Libertie Hausler Duo, MD 12/04/16 [id number]    Lavera GuiseLiu, Janylah Belgrave Duo, MD 12/04/16 [phone]

## 2016-12-07 ENCOUNTER — Ambulatory Visit (INDEPENDENT_AMBULATORY_CARE_PROVIDER_SITE_OTHER): Payer: Medicare Other | Admitting: Orthopedic Surgery

## 2016-12-07 ENCOUNTER — Encounter: Payer: Self-pay | Admitting: Orthopedic Surgery

## 2016-12-07 VITALS — BP 122/65 | HR 68 | Temp 98.1°F

## 2016-12-07 DIAGNOSIS — R2681 Unsteadiness on feet: Secondary | ICD-10-CM | POA: Diagnosis not present

## 2016-12-07 DIAGNOSIS — S62111A Displaced fracture of triquetrum [cuneiform] bone, right wrist, initial encounter for closed fracture: Secondary | ICD-10-CM | POA: Diagnosis not present

## 2016-12-07 DIAGNOSIS — G301 Alzheimer's disease with late onset: Secondary | ICD-10-CM | POA: Diagnosis not present

## 2016-12-07 DIAGNOSIS — R1310 Dysphagia, unspecified: Secondary | ICD-10-CM | POA: Diagnosis not present

## 2016-12-07 DIAGNOSIS — R293 Abnormal posture: Secondary | ICD-10-CM | POA: Diagnosis not present

## 2016-12-07 DIAGNOSIS — L03211 Cellulitis of face: Secondary | ICD-10-CM | POA: Diagnosis not present

## 2016-12-07 DIAGNOSIS — Z8639 Personal history of other endocrine, nutritional and metabolic disease: Secondary | ICD-10-CM | POA: Diagnosis not present

## 2016-12-07 NOTE — Progress Notes (Signed)
  NEW PATIENT OFFICE VISIT    Chief Complaint  Patient presents with  . Wrist Injury    RIGHT WRIST FRACTURE, DOI 12/03/16    83 year old male with dementia presents for evaluation of right triquetral fracture  Injury date 4 days ago Right hand pain new-onset Constant Dull Nonradiating Located over the right dorsal hand    Review of Systems  Unable to perform ROS: Dementia     Past Medical History:  Diagnosis Date  . Assistance needed for mobility    walks with walker  . Dementia   . Depression   . Hyperlipidemia    Statin intolerance as leg weakness  . Hypothyroid   . Mild memory disturbance   . Thrombocytopenia (HCC)     Past Surgical History:  Procedure Laterality Date  . CHOLECYSTECTOMY    . RETINAL DETACHMENT SURGERY Right   . TONSILLECTOMY      Family History  Problem Relation Age of Onset  . Cancer Mother        Diffuse metastases of unknown primary  . Cancer Sister        Breast  . Cancer Brother        Gastrointestinal  . Stroke Neg Hx   . Diabetes Neg Hx   . Heart disease Neg Hx    Social History  Substance Use Topics  . Smoking status: Never Smoker  . Smokeless tobacco: Never Used  . Alcohol use No    BP 122/65   Pulse 68   Temp 98.1 F (36.7 C)   Physical Exam The patient has a lot of bruising and ecchymosis from the fall including no over his right eye. His general appearance show some cachexia with thin body habitus. He is oriented to person respond but not oriented to time or place  His mood is flat his affect is flat Ortho Exam He was in a wheelchair did not walk for us today His right upper extremity shows ecchymosis and bruising multiple skin tears He has painful range of motion of his right wrist tenderness over the triquetrum No wrist instability detected No gross motor or atrophy Normal pulse and perfusion in the right hand Normal sensation in the right hand Skin tear is noted   Meds ordered this encounter   Medications  . acetaminophen (TYLENOL) 650 MG CR tablet    Sig: Take 650 mg by mouth every 8 (eight) hours as needed for pain.    Encounter Diagnosis  Name Primary?  . Triquetral chip fracture, right, closed, initial encounter Yes     PLAN:  Xeroform dressing over the skin tear Cock-up splint Return 6 weeks no x-ray needed Change dressing daily

## 2016-12-08 DIAGNOSIS — R1310 Dysphagia, unspecified: Secondary | ICD-10-CM | POA: Diagnosis not present

## 2016-12-08 DIAGNOSIS — R2681 Unsteadiness on feet: Secondary | ICD-10-CM | POA: Diagnosis not present

## 2016-12-08 DIAGNOSIS — R293 Abnormal posture: Secondary | ICD-10-CM | POA: Diagnosis not present

## 2016-12-09 DIAGNOSIS — R1310 Dysphagia, unspecified: Secondary | ICD-10-CM | POA: Diagnosis not present

## 2016-12-09 DIAGNOSIS — R2681 Unsteadiness on feet: Secondary | ICD-10-CM | POA: Diagnosis not present

## 2016-12-09 DIAGNOSIS — R293 Abnormal posture: Secondary | ICD-10-CM | POA: Diagnosis not present

## 2016-12-10 DIAGNOSIS — R1310 Dysphagia, unspecified: Secondary | ICD-10-CM | POA: Diagnosis not present

## 2016-12-10 DIAGNOSIS — R293 Abnormal posture: Secondary | ICD-10-CM | POA: Diagnosis not present

## 2016-12-10 DIAGNOSIS — R2681 Unsteadiness on feet: Secondary | ICD-10-CM | POA: Diagnosis not present

## 2016-12-11 DIAGNOSIS — R293 Abnormal posture: Secondary | ICD-10-CM | POA: Diagnosis not present

## 2016-12-11 DIAGNOSIS — R2681 Unsteadiness on feet: Secondary | ICD-10-CM | POA: Diagnosis not present

## 2016-12-11 DIAGNOSIS — R1310 Dysphagia, unspecified: Secondary | ICD-10-CM | POA: Diagnosis not present

## 2016-12-14 DIAGNOSIS — R293 Abnormal posture: Secondary | ICD-10-CM | POA: Diagnosis not present

## 2016-12-14 DIAGNOSIS — R2681 Unsteadiness on feet: Secondary | ICD-10-CM | POA: Diagnosis not present

## 2016-12-14 DIAGNOSIS — Z8639 Personal history of other endocrine, nutritional and metabolic disease: Secondary | ICD-10-CM | POA: Diagnosis not present

## 2016-12-14 DIAGNOSIS — G301 Alzheimer's disease with late onset: Secondary | ICD-10-CM | POA: Diagnosis not present

## 2016-12-14 DIAGNOSIS — R1310 Dysphagia, unspecified: Secondary | ICD-10-CM | POA: Diagnosis not present

## 2016-12-14 DIAGNOSIS — L03211 Cellulitis of face: Secondary | ICD-10-CM | POA: Diagnosis not present

## 2016-12-15 ENCOUNTER — Emergency Department (HOSPITAL_COMMUNITY): Payer: Medicare Other

## 2016-12-15 ENCOUNTER — Encounter (HOSPITAL_COMMUNITY): Payer: Self-pay | Admitting: *Deleted

## 2016-12-15 ENCOUNTER — Emergency Department (HOSPITAL_COMMUNITY)
Admission: EM | Admit: 2016-12-15 | Discharge: 2016-12-16 | Disposition: A | Discharge status: Skilled Nursing Facility | Payer: Medicare Other | Source: Home / Self Care | Attending: Emergency Medicine | Admitting: Emergency Medicine

## 2016-12-15 DIAGNOSIS — E871 Hypo-osmolality and hyponatremia: Secondary | ICD-10-CM | POA: Diagnosis not present

## 2016-12-15 DIAGNOSIS — R220 Localized swelling, mass and lump, head: Secondary | ICD-10-CM

## 2016-12-15 DIAGNOSIS — B0230 Zoster ocular disease, unspecified: Secondary | ICD-10-CM | POA: Diagnosis not present

## 2016-12-15 DIAGNOSIS — Z79899 Other long term (current) drug therapy: Secondary | ICD-10-CM | POA: Insufficient documentation

## 2016-12-15 DIAGNOSIS — Z66 Do not resuscitate: Secondary | ICD-10-CM | POA: Diagnosis not present

## 2016-12-15 DIAGNOSIS — Z888 Allergy status to other drugs, medicaments and biological substances status: Secondary | ICD-10-CM | POA: Diagnosis not present

## 2016-12-15 DIAGNOSIS — R1310 Dysphagia, unspecified: Secondary | ICD-10-CM | POA: Diagnosis not present

## 2016-12-15 DIAGNOSIS — Z743 Need for continuous supervision: Secondary | ICD-10-CM | POA: Diagnosis not present

## 2016-12-15 DIAGNOSIS — S0181XA Laceration without foreign body of other part of head, initial encounter: Secondary | ICD-10-CM | POA: Diagnosis not present

## 2016-12-15 DIAGNOSIS — R600 Localized edema: Secondary | ICD-10-CM

## 2016-12-15 DIAGNOSIS — D61818 Other pancytopenia: Secondary | ICD-10-CM | POA: Diagnosis not present

## 2016-12-15 DIAGNOSIS — R293 Abnormal posture: Secondary | ICD-10-CM | POA: Diagnosis not present

## 2016-12-15 DIAGNOSIS — L03211 Cellulitis of face: Secondary | ICD-10-CM

## 2016-12-15 DIAGNOSIS — B029 Zoster without complications: Secondary | ICD-10-CM | POA: Diagnosis not present

## 2016-12-15 DIAGNOSIS — Z809 Family history of malignant neoplasm, unspecified: Secondary | ICD-10-CM | POA: Diagnosis not present

## 2016-12-15 DIAGNOSIS — Z9049 Acquired absence of other specified parts of digestive tract: Secondary | ICD-10-CM | POA: Diagnosis not present

## 2016-12-15 DIAGNOSIS — A419 Sepsis, unspecified organism: Secondary | ICD-10-CM | POA: Diagnosis not present

## 2016-12-15 DIAGNOSIS — E039 Hypothyroidism, unspecified: Secondary | ICD-10-CM | POA: Insufficient documentation

## 2016-12-15 DIAGNOSIS — Z803 Family history of malignant neoplasm of breast: Secondary | ICD-10-CM | POA: Diagnosis not present

## 2016-12-15 DIAGNOSIS — R2681 Unsteadiness on feet: Secondary | ICD-10-CM | POA: Diagnosis not present

## 2016-12-15 DIAGNOSIS — R279 Unspecified lack of coordination: Secondary | ICD-10-CM | POA: Diagnosis not present

## 2016-12-15 DIAGNOSIS — E785 Hyperlipidemia, unspecified: Secondary | ICD-10-CM | POA: Diagnosis not present

## 2016-12-15 DIAGNOSIS — I517 Cardiomegaly: Secondary | ICD-10-CM | POA: Diagnosis not present

## 2016-12-15 DIAGNOSIS — E43 Unspecified severe protein-calorie malnutrition: Secondary | ICD-10-CM | POA: Diagnosis not present

## 2016-12-15 DIAGNOSIS — L0390 Cellulitis, unspecified: Secondary | ICD-10-CM | POA: Diagnosis not present

## 2016-12-15 DIAGNOSIS — R404 Transient alteration of awareness: Secondary | ICD-10-CM | POA: Diagnosis not present

## 2016-12-15 DIAGNOSIS — Z22322 Carrier or suspected carrier of Methicillin resistant Staphylococcus aureus: Secondary | ICD-10-CM | POA: Diagnosis not present

## 2016-12-15 LAB — URINALYSIS, ROUTINE W REFLEX MICROSCOPIC
Bacteria, UA: NONE SEEN
Bilirubin Urine: NEGATIVE
GLUCOSE, UA: NEGATIVE mg/dL
Ketones, ur: NEGATIVE mg/dL
Leukocytes, UA: NEGATIVE
Nitrite: NEGATIVE
PROTEIN: NEGATIVE mg/dL
Specific Gravity, Urine: 1.02 (ref 1.005–1.030)
Squamous Epithelial / LPF: NONE SEEN
pH: 6 (ref 5.0–8.0)

## 2016-12-15 LAB — COMPREHENSIVE METABOLIC PANEL
ALK PHOS: 136 U/L — AB (ref 38–126)
ALT: 24 U/L (ref 17–63)
AST: 40 U/L (ref 15–41)
Albumin: 3.1 g/dL — ABNORMAL LOW (ref 3.5–5.0)
Anion gap: 8 (ref 5–15)
BUN: 14 mg/dL (ref 6–20)
CALCIUM: 8.2 mg/dL — AB (ref 8.9–10.3)
CHLORIDE: 103 mmol/L (ref 101–111)
CO2: 21 mmol/L — AB (ref 22–32)
CREATININE: 0.98 mg/dL (ref 0.61–1.24)
GFR calc Af Amer: >60 mL/min (ref >60)
Glucose, Bld: 105 mg/dL — ABNORMAL HIGH (ref 65–99)
Potassium: 4.1 mmol/L (ref 3.5–5.1)
Sodium: 132 mmol/L — ABNORMAL LOW (ref 135–145)
Total Bilirubin: 0.9 mg/dL (ref 0.3–1.2)
Total Protein: 5.8 g/dL — ABNORMAL LOW (ref 6.5–8.1)

## 2016-12-15 LAB — CBC WITH DIFFERENTIAL/PLATELET
Basophils Absolute: 0 10*3/uL (ref 0.0–0.1)
Basophils Relative: 1 %
EOS PCT: 4 %
Eosinophils Absolute: 0.1 10*3/uL (ref 0.0–0.7)
HCT: 35.2 % — ABNORMAL LOW (ref 39.0–52.0)
Hemoglobin: 11.8 g/dL — ABNORMAL LOW (ref 13.0–17.0)
LYMPHS PCT: 28 %
Lymphs Abs: 0.8 10*3/uL (ref 0.7–4.0)
MCH: 33.4 pg (ref 26.0–34.0)
MCHC: 33.5 g/dL (ref 30.0–36.0)
MCV: 99.7 fL (ref 78.0–100.0)
MONO ABS: 0.3 10*3/uL (ref 0.1–1.0)
Monocytes Relative: 9 %
Neutro Abs: 1.7 10*3/uL (ref 1.7–7.7)
Neutrophils Relative %: 59 %
PLATELETS: 47 10*3/uL — AB (ref 150–400)
RBC: 3.53 MIL/uL — ABNORMAL LOW (ref 4.22–5.81)
RDW: 15.1 % (ref 11.5–15.5)
WBC: 3 10*3/uL — ABNORMAL LOW (ref 4.0–10.5)

## 2016-12-15 LAB — PROTIME-INR
INR: 1.11
Prothrombin Time: 14.3 seconds (ref 11.4–15.2)

## 2016-12-15 LAB — I-STAT CG4 LACTIC ACID, ED: LACTIC ACID, VENOUS: 1.25 mmol/L (ref 0.5–1.9)

## 2016-12-15 MED ORDER — DOXYCYCLINE HYCLATE 100 MG IV SOLR
INTRAVENOUS | Status: AC
  Filled 2016-12-15: qty 100

## 2016-12-15 MED ORDER — DEXTROSE 5 % IV SOLN
100.0000 mg | Freq: Once | INTRAVENOUS | Status: AC
  Administered 2016-12-15: 100 mg via INTRAVENOUS
  Filled 2016-12-15: qty 100

## 2016-12-15 MED ORDER — IOPAMIDOL (ISOVUE-300) INJECTION 61%
75.0000 mL | Freq: Once | INTRAVENOUS | Status: AC | PRN
Start: 2016-12-15 — End: 2016-12-15
  Administered 2016-12-15: 75 mL via INTRAVENOUS

## 2016-12-15 MED ORDER — HYDROCODONE-ACETAMINOPHEN 5-325 MG PO TABS
0.5000 | ORAL_TABLET | Freq: Once | ORAL | Status: AC
  Administered 2016-12-15: 0.5 via ORAL
  Filled 2016-12-15: qty 1

## 2016-12-15 MED ORDER — SODIUM CHLORIDE 0.9 % IV BOLUS (SEPSIS)
1000.0000 mL | Freq: Once | INTRAVENOUS | Status: AC
  Administered 2016-12-15: 1000 mL via INTRAVENOUS

## 2016-12-15 MED ORDER — ACETAMINOPHEN 325 MG PO TABS
650.0000 mg | ORAL_TABLET | Freq: Once | ORAL | Status: AC
  Administered 2016-12-15: 650 mg via ORAL
  Filled 2016-12-15: qty 2

## 2016-12-15 NOTE — ED Triage Notes (Signed)
Pt is a resident of [facility] who returns to er for further evaluation of fever and ?cellulitis to right side of face, recently had a a fall that required stitches to right eye area,

## 2016-12-15 NOTE — ED Provider Notes (Signed)
[facility]-EMERGENCY DEPT Provider Note   CSN: [id number] Arrival date & time: 12/15/16  2105     History   Chief Complaint Chief Complaint  Patient presents with  . Fever    HPI Trevor Brock is a 83 y.o. male.   Fever   This is a new problem. The current episode started 3 to 5 hours ago. The problem occurs constantly. The problem has not changed since onset.The maximum temperature noted was 101 to 101.9 F. Pertinent negatives include no chest pain, no congestion and no cough. He has tried nothing for the symptoms.    Past Medical History:  Diagnosis Date  . Assistance needed for mobility    walks with walker  . Dementia   . Depression   . Hyperlipidemia    Statin intolerance as leg weakness  . Hypothyroid   . Mild memory disturbance   . Thrombocytopenia [facility])     Patient Active Problem List   Diagnosis Date Noted  . Generalized weakness 11/11/2016  . Heart murmur 11/11/2016  . Facial cellulitis 11/10/2016  . MRSA carrier 10/22/2015  . Cellulitis and abscess 10/17/2015  . Thrombocytopenia (HCC) 10/17/2015  . Depression 10/17/2015  . Sepsis, unspecified organism (HCC) 10/17/2015  . Malnutrition of moderate degree 10/17/2015  . Anisocoria 10/16/2015  . Pancytopenia (HCC) 10/07/2015  . Syncope 09/28/2015  . Dementia 09/28/2015  . Assistance needed for mobility   . Hyperlipidemia   . Hypothyroid     Past Surgical History:  Procedure Laterality Date  . CHOLECYSTECTOMY    . RETINAL DETACHMENT SURGERY Right   . TONSILLECTOMY         Home Medications    Prior to Admission medications   Medication Sig Start Date End Date Taking? Authorizing Provider  levothyroxine (SYNTHROID, LEVOTHROID) 112 MCG tablet Take one tablet by mouth 30 minutes before breakfast once daily for thyroid 09/15/15  Yes [provider]  sertraline (ZOLOFT) 50 MG tablet Take 1 tablet (50 mg total) by mouth daily. 10/03/15  Yes Dorothea Ogle, MD  acetaminophen (TYLENOL) 325 MG  tablet Take 2 tablets (650 mg total) by mouth every 6 (six) hours as needed for moderate pain or fever. 12/16/16   Katherina Wimer, Barbara Cower, MD  doxycycline (VIBRAMYCIN) 100 MG capsule Take 1 capsule (100 mg total) by mouth 2 (two) times daily. One po bid x 10 days 12/16/16 12/26/16  Kenyette Gundy, Barbara Cower, MD  HYDROcodone-acetaminophen (NORCO) 5-325 MG tablet Take 0.5-1 tablets by mouth every 6 (six) hours as needed for severe pain. 12/16/16   Bennet Kujawa, Barbara Cower, MD    Family History Family History  Problem Relation Age of Onset  . Cancer Mother        Diffuse metastases of unknown primary  . Cancer Sister        Breast  . Cancer Brother        Gastrointestinal  . Stroke Neg Hx   . Diabetes Neg Hx   . Heart disease Neg Hx     Social History Social History  Substance Use Topics  . Smoking status: Never Smoker  . Smokeless tobacco: Never Used  . Alcohol use No     Allergies   Statins   Review of Systems Review of Systems  Unable to perform ROS: Dementia  Constitutional: Positive for fever.  HENT: Negative for congestion.   Respiratory: Negative for cough.   Cardiovascular: Negative for chest pain.     Physical Exam Updated Vital Signs BP (!) 121/53   Pulse 66  Temp 99.5 F (37.5 C) (Oral)   Resp 16   Wt 83.9 kg (185 lb)   SpO2 96%   BMI 25.80 kg/m   Physical Exam  Constitutional: He is oriented to person, place, and time. He appears well-developed and well-nourished.  HENT:  Head: Normocephalic and atraumatic.  Eyes: Conjunctivae and EOM are normal.  Neck: Normal range of motion.  Cardiovascular: Normal rate.   Pulmonary/Chest: Effort normal. No respiratory distress. He has no wheezes.  Abdominal: He exhibits no distension.  Musculoskeletal: Normal range of motion. He exhibits no edema or deformity.  Neurological: He is alert and oriented to person, place, and time. No cranial nerve deficit.  Skin:  Large area of erythema to whole right face with some scabs, swelling on inside of  mouth, extends partially into upper right neck, warm, mild edema.  Nursing note and vitals reviewed.    ED Treatments / Results  Labs (all labs ordered are listed, but only abnormal results are displayed) Labs Reviewed  COMPREHENSIVE METABOLIC PANEL - Abnormal; Notable for the following:       Result Value   Sodium 132 (*)    CO2 21 (*)    Glucose, Bld 105 (*)    Calcium 8.2 (*)    Total Protein 5.8 (*)    Albumin 3.1 (*)    Alkaline Phosphatase 136 (*)    All other components within normal limits  CBC WITH DIFFERENTIAL/PLATELET - Abnormal; Notable for the following:    WBC 3.0 (*)    RBC 3.53 (*)    Hemoglobin 11.8 (*)    HCT 35.2 (*)    Platelets 47 (*)    All other components within normal limits  URINALYSIS, ROUTINE W REFLEX MICROSCOPIC - Abnormal; Notable for the following:    Hgb urine dipstick SMALL (*)    All other components within normal limits  CULTURE, BLOOD (ROUTINE X 2)  CULTURE, BLOOD (ROUTINE X 2)  PROTIME-INR  I-STAT CG4 LACTIC ACID, ED    EKG  EKG Interpretation None       Radiology Dg Chest 2 View  Result Date: 12/15/2016 CLINICAL DATA:  Fever, cellulitis. EXAM: CHEST  2 VIEW COMPARISON:  Chest radiograph Nov 10, 2016 FINDINGS: Cardiac silhouette is mildly enlarged and unchanged. Tortuous mildly calcified aorta. Low inspiratory examination. Mild bronchitic changes and LEFT lung base strandy densities. No pneumothorax. Osteopenia. IMPRESSION: Mild cardiomegaly and mild bronchitic changes with LEFT lung base atelectasis/scarring. Electronically Signed   By: Awilda Metroourtnay  Bloomer M.D.   On: 12/15/2016 22:29   Ct Maxillofacial W Contrast  Result Date: 12/16/2016 CLINICAL DATA:  Initial evaluation for recent fall with laceration to right side of face, increased swelling with redness and fever. EXAM: CT MAXILLOFACIAL WITH CONTRAST TECHNIQUE: Multidetector CT imaging of the maxillofacial structures was performed. Multiplanar CT image reconstructions were also  generated. A small metallic BB was placed on the right temple in order to reliably differentiate right from left. COMPARISON:  Prior CT from 12/03/2016. FINDINGS: Osseous: No acute osseous abnormality about the face. Leftward nasal septal deviation noted. Orbits: Postoperative changes present at the right globe. Globes and orbital soft tissues otherwise within normal limits. Sinuses: Mild scattered mucosal thickening within the ethmoidal air cells. Paranasal sinuses are otherwise clear. Trace opacity noted within the right mastoid air cells. Left mastoid air cells clear. Middle ear cavities are clear. Soft tissues: Soft tissue swelling with inflammatory stranding present within the right face, involving the right cheek and right masticator space, concerning for  cellulitis given the provided history. Overlying skin thickening. Changes extend inferiorly into the anterior aspect of the partially visualized right upper neck. Mild inflammatory stranding within the right retro antral fat. Right parapharyngeal fat preserved at this time without deeper extension into the neck. No discrete abscess or drainable fluid collection identified. No postseptal extension into the bony right orbit. Few mildly prominent right level 1 B lymph nodes, likely reactive. Limited intracranial: Unremarkable. IMPRESSION: Asymmetric soft tissue swelling with inflammatory stranding within the right face, extending inferiorly along the anterior aspect of the upper right neck, concerning for cellulitis given the provided history. No abscess or drainable fluid collection identified. No postseptal extension into the bony right orbit. Electronically Signed   By: Rise Mu M.D.   On: 12/16/2016 00:08    Procedures Procedures (including critical care time)  Medications Ordered in ED Medications  acetaminophen (TYLENOL) tablet 650 mg (650 mg Oral Given 12/15/16 2155)  sodium chloride 0.9 % bolus 1,000 mL (0 mLs Intravenous Stopped  12/15/16 2353)  doxycycline (VIBRAMYCIN) 100 mg in dextrose 5 % 250 mL IVPB (0 mg Intravenous Stopped 12/16/16 0056)  iopamidol (ISOVUE-300) 61 % injection 75 mL (75 mLs Intravenous Contrast Given 12/15/16 2302)  HYDROcodone-acetaminophen (NORCO/VICODIN) 5-325 MG per tablet 0.5 tablet (0.5 tablets Oral Given 12/15/16 2349)     Initial Impression / Assessment and Plan / ED Course  I have reviewed the triage vital signs and the nursing notes.  Pertinent labs & imaging results that were available during my care of the patient were reviewed by me and considered in my medical decision making (see chart for details).    Suspect cellulitis, will eval for underlying abscess/bone infection and for e/o sepsis. Likely will be able to dc back to SNF on oral antibiotics.   CT with just cellulitis, no obvious abscesses or airway involvement. Observed in ER, given IV doxycycline. Persistently normal VS, no decline in MS, family updated and discussed multiple times, should still be suitable for outpatient treatment with close supervision at SNF but will need to return if not improving after 48-72 hours of antibiotics.   Final Clinical Impressions(s) / ED Diagnoses   Final diagnoses:  Facial swelling  Cellulitis of face    New Prescriptions Discharge Medication List as of 12/16/2016 12:25 AM    START taking these medications   Details  HYDROcodone-acetaminophen (NORCO) 5-325 MG tablet Take 0.5-1 tablets by mouth every 6 (six) hours as needed for severe pain., Starting Wed 12/16/2016, Print         Kennesha Brewbaker, Barbara Cower, MD 12/16/16 [phone]

## 2016-12-15 NOTE — ED Notes (Signed)
Sutures removed from pt's rt eyebrow

## 2016-12-16 DIAGNOSIS — R1310 Dysphagia, unspecified: Secondary | ICD-10-CM | POA: Diagnosis not present

## 2016-12-16 DIAGNOSIS — R293 Abnormal posture: Secondary | ICD-10-CM | POA: Diagnosis not present

## 2016-12-16 MED ORDER — DOXYCYCLINE HYCLATE 100 MG PO CAPS: 100.0000 mg | ORAL_CAPSULE | Freq: Two times a day (BID) | ORAL | 0 refills | Status: DC

## 2016-12-16 MED ORDER — ACETAMINOPHEN 325 MG PO TABS: 650.0000 mg | ORAL_TABLET | Freq: Four times a day (QID) | ORAL | 0 refills | Status: AC | PRN

## 2016-12-16 MED ORDER — HYDROCODONE-ACETAMINOPHEN 5-325 MG PO TABS: 0.5000 | ORAL_TABLET | Freq: Four times a day (QID) | ORAL | 0 refills | Status: DC | PRN

## 2016-12-16 MED ORDER — ACETAMINOPHEN 325 MG PO TABS: 650.0000 mg | ORAL_TABLET | Freq: Four times a day (QID) | ORAL | 0 refills | Status: DC | PRN

## 2016-12-16 NOTE — ED Notes (Signed)
Call[facility] abnormal anaerobic  gram positive culture [facility] [place].  Labs given to [employer]atie Pulley.

## 2016-12-16 NOTE — ED Notes (Signed)
Trevor Brock states understanding of care given and follow up instructions.

## 2016-12-17 ENCOUNTER — Inpatient Hospital Stay (HOSPITAL_COMMUNITY)
Admission: EM | Admit: 2016-12-17 | Discharge: 2016-12-20 | DRG: 872 | Disposition: A | Discharge status: Skilled Nursing Facility | Payer: Medicare Other | Attending: Family Medicine | Admitting: Family Medicine

## 2016-12-17 ENCOUNTER — Encounter (HOSPITAL_COMMUNITY): Payer: Self-pay | Admitting: Emergency Medicine

## 2016-12-17 DIAGNOSIS — Z7189 Other specified counseling: Secondary | ICD-10-CM | POA: Diagnosis not present

## 2016-12-17 DIAGNOSIS — F0391 Unspecified dementia with behavioral disturbance: Secondary | ICD-10-CM | POA: Diagnosis present

## 2016-12-17 DIAGNOSIS — Z515 Encounter for palliative care: Secondary | ICD-10-CM | POA: Insufficient documentation

## 2016-12-17 DIAGNOSIS — E039 Hypothyroidism, unspecified: Secondary | ICD-10-CM | POA: Diagnosis not present

## 2016-12-17 DIAGNOSIS — L0201 Cutaneous abscess of face: Secondary | ICD-10-CM

## 2016-12-17 DIAGNOSIS — F0390 Unspecified dementia without behavioral disturbance: Secondary | ICD-10-CM | POA: Diagnosis present

## 2016-12-17 DIAGNOSIS — B0229 Other postherpetic nervous system involvement: Secondary | ICD-10-CM | POA: Diagnosis present

## 2016-12-17 DIAGNOSIS — F329 Major depressive disorder, single episode, unspecified: Secondary | ICD-10-CM | POA: Diagnosis present

## 2016-12-17 DIAGNOSIS — Z803 Family history of malignant neoplasm of breast: Secondary | ICD-10-CM | POA: Diagnosis not present

## 2016-12-17 DIAGNOSIS — B0230 Zoster ocular disease, unspecified: Secondary | ICD-10-CM | POA: Diagnosis present

## 2016-12-17 DIAGNOSIS — D61818 Other pancytopenia: Secondary | ICD-10-CM | POA: Diagnosis present

## 2016-12-17 DIAGNOSIS — E871 Hypo-osmolality and hyponatremia: Secondary | ICD-10-CM | POA: Diagnosis not present

## 2016-12-17 DIAGNOSIS — A419 Sepsis, unspecified organism: Secondary | ICD-10-CM | POA: Diagnosis not present

## 2016-12-17 DIAGNOSIS — Z9049 Acquired absence of other specified parts of digestive tract: Secondary | ICD-10-CM | POA: Diagnosis not present

## 2016-12-17 DIAGNOSIS — Z888 Allergy status to other drugs, medicaments and biological substances status: Secondary | ICD-10-CM

## 2016-12-17 DIAGNOSIS — S0993XA Unspecified injury of face, initial encounter: Secondary | ICD-10-CM | POA: Diagnosis not present

## 2016-12-17 DIAGNOSIS — L0390 Cellulitis, unspecified: Secondary | ICD-10-CM | POA: Diagnosis not present

## 2016-12-17 DIAGNOSIS — R030 Elevated blood-pressure reading, without diagnosis of hypertension: Secondary | ICD-10-CM | POA: Diagnosis not present

## 2016-12-17 DIAGNOSIS — Z809 Family history of malignant neoplasm, unspecified: Secondary | ICD-10-CM | POA: Diagnosis not present

## 2016-12-17 DIAGNOSIS — B028 Zoster with other complications: Secondary | ICD-10-CM | POA: Diagnosis not present

## 2016-12-17 DIAGNOSIS — B029 Zoster without complications: Secondary | ICD-10-CM | POA: Diagnosis not present

## 2016-12-17 DIAGNOSIS — L03211 Cellulitis of face: Secondary | ICD-10-CM | POA: Diagnosis present

## 2016-12-17 DIAGNOSIS — E785 Hyperlipidemia, unspecified: Secondary | ICD-10-CM | POA: Diagnosis present

## 2016-12-17 DIAGNOSIS — Z22322 Carrier or suspected carrier of Methicillin resistant Staphylococcus aureus: Secondary | ICD-10-CM | POA: Diagnosis not present

## 2016-12-17 DIAGNOSIS — Z6825 Body mass index (BMI) 25.0-25.9, adult: Secondary | ICD-10-CM

## 2016-12-17 DIAGNOSIS — E43 Unspecified severe protein-calorie malnutrition: Secondary | ICD-10-CM | POA: Diagnosis not present

## 2016-12-17 DIAGNOSIS — Z66 Do not resuscitate: Secondary | ICD-10-CM | POA: Diagnosis not present

## 2016-12-17 LAB — BLOOD CULTURE ID PANEL (REFLEXED)
ACINETOBACTER BAUMANNII: NOT DETECTED
CANDIDA GLABRATA: NOT DETECTED
CANDIDA KRUSEI: NOT DETECTED
Candida albicans: NOT DETECTED
Candida parapsilosis: NOT DETECTED
Candida tropicalis: NOT DETECTED
ENTEROBACTER CLOACAE COMPLEX: NOT DETECTED
ENTEROBACTERIACEAE SPECIES: NOT DETECTED
ENTEROCOCCUS SPECIES: NOT DETECTED
ESCHERICHIA COLI: NOT DETECTED
Haemophilus influenzae: NOT DETECTED
Klebsiella oxytoca: NOT DETECTED
Klebsiella pneumoniae: NOT DETECTED
Listeria monocytogenes: NOT DETECTED
Methicillin resistance: DETECTED — AB
NEISSERIA MENINGITIDIS: NOT DETECTED
PSEUDOMONAS AERUGINOSA: NOT DETECTED
Proteus species: NOT DETECTED
STAPHYLOCOCCUS SPECIES: DETECTED — AB
STREPTOCOCCUS AGALACTIAE: NOT DETECTED
STREPTOCOCCUS PNEUMONIAE: NOT DETECTED
STREPTOCOCCUS SPECIES: NOT DETECTED
Serratia marcescens: NOT DETECTED
Staphylococcus aureus (BCID): NOT DETECTED
Streptococcus pyogenes: NOT DETECTED

## 2016-12-17 LAB — CBC WITH DIFFERENTIAL/PLATELET
Basophils Absolute: 0 10*3/uL (ref 0.0–0.1)
Basophils Relative: 1 %
Eosinophils Absolute: 0.1 10*3/uL (ref 0.0–0.7)
Eosinophils Relative: 4 %
HEMATOCRIT: 36.8 % — AB (ref 39.0–52.0)
HEMOGLOBIN: 12.4 g/dL — AB (ref 13.0–17.0)
LYMPHS PCT: 36 %
Lymphs Abs: 1.1 10*3/uL (ref 0.7–4.0)
MCH: 33.2 pg (ref 26.0–34.0)
MCHC: 33.7 g/dL (ref 30.0–36.0)
MCV: 98.7 fL (ref 78.0–100.0)
MONOS PCT: 14 %
Monocytes Absolute: 0.4 10*3/uL (ref 0.1–1.0)
NEUTROS ABS: 1.5 10*3/uL — AB (ref 1.7–7.7)
NEUTROS PCT: 47 %
Platelets: 45 10*3/uL — ABNORMAL LOW (ref 150–400)
RBC: 3.73 MIL/uL — AB (ref 4.22–5.81)
RDW: 14.2 % (ref 11.5–15.5)
WBC: 3.2 10*3/uL — AB (ref 4.0–10.5)

## 2016-12-17 LAB — COMPREHENSIVE METABOLIC PANEL
ALK PHOS: 148 U/L — AB (ref 38–126)
ALT: 24 U/L (ref 17–63)
ANION GAP: 7 (ref 5–15)
AST: 39 U/L (ref 15–41)
Albumin: 3.1 g/dL — ABNORMAL LOW (ref 3.5–5.0)
BUN: 13 mg/dL (ref 6–20)
CALCIUM: 8.5 mg/dL — AB (ref 8.9–10.3)
CHLORIDE: 103 mmol/L (ref 101–111)
CO2: 23 mmol/L (ref 22–32)
CREATININE: 0.89 mg/dL (ref 0.61–1.24)
Glucose, Bld: 101 mg/dL — ABNORMAL HIGH (ref 65–99)
Potassium: 4.1 mmol/L (ref 3.5–5.1)
Sodium: 133 mmol/L — ABNORMAL LOW (ref 135–145)
Total Bilirubin: 1 mg/dL (ref 0.3–1.2)
Total Protein: 6.1 g/dL — ABNORMAL LOW (ref 6.5–8.1)

## 2016-12-17 LAB — I-STAT CG4 LACTIC ACID, ED: LACTIC ACID, VENOUS: 1.73 mmol/L (ref 0.5–1.9)

## 2016-12-17 MED ORDER — SERTRALINE HCL 50 MG PO TABS
50.0000 mg | ORAL_TABLET | Freq: Every day | ORAL | Status: DC
  Administered 2016-12-18 – 2016-12-20 (×3): 50 mg via ORAL
  Filled 2016-12-17 (×3): qty 1

## 2016-12-17 MED ORDER — VANCOMYCIN HCL 10 G IV SOLR
1500.0000 mg | INTRAVENOUS | Status: DC
  Administered 2016-12-18 – 2016-12-19 (×2): 1500 mg via INTRAVENOUS
  Filled 2016-12-17 (×2): qty 1500

## 2016-12-17 MED ORDER — VANCOMYCIN HCL 500 MG IV SOLR
500.0000 mg | Freq: Once | INTRAVENOUS | Status: AC
  Administered 2016-12-17: 500 mg via INTRAVENOUS
  Filled 2016-12-17: qty 500

## 2016-12-17 MED ORDER — PIPERACILLIN-TAZOBACTAM 3.375 G IVPB 30 MIN
3.3750 g | Freq: Once | INTRAVENOUS | Status: AC
  Administered 2016-12-17: 3.375 g via INTRAVENOUS
  Filled 2016-12-17: qty 50

## 2016-12-17 MED ORDER — ERYTHROMYCIN 5 MG/GM OP OINT
TOPICAL_OINTMENT | Freq: Four times a day (QID) | OPHTHALMIC | Status: DC
  Administered 2016-12-17 – 2016-12-20 (×12): via OPHTHALMIC
  Filled 2016-12-17: qty 3.5

## 2016-12-17 MED ORDER — ACETAMINOPHEN 325 MG PO TABS: 650.0000 mg | ORAL_TABLET | Freq: Four times a day (QID) | ORAL | Status: DC | PRN

## 2016-12-17 MED ORDER — LEVOTHYROXINE SODIUM 112 MCG PO TABS
112.0000 ug | ORAL_TABLET | Freq: Every day | ORAL | Status: DC
  Administered 2016-12-18 – 2016-12-20 (×3): 112 ug via ORAL
  Filled 2016-12-17 (×3): qty 1

## 2016-12-17 MED ORDER — VANCOMYCIN HCL 10 G IV SOLR
1500.0000 mg | Freq: Once | INTRAVENOUS | Status: DC
  Filled 2016-12-17: qty 1500

## 2016-12-17 MED ORDER — ACETAMINOPHEN 650 MG RE SUPP: 650.0000 mg | Freq: Four times a day (QID) | RECTAL | Status: DC | PRN

## 2016-12-17 MED ORDER — SODIUM CHLORIDE 0.9 % IV BOLUS (SEPSIS)
1000.0000 mL | Freq: Once | INTRAVENOUS | Status: AC
  Administered 2016-12-17: 1000 mL via INTRAVENOUS

## 2016-12-17 MED ORDER — VANCOMYCIN HCL IN DEXTROSE 1-5 GM/200ML-% IV SOLN
1000.0000 mg | Freq: Once | INTRAVENOUS | Status: DC
  Filled 2016-12-17: qty 200

## 2016-12-17 MED ORDER — ONDANSETRON HCL 4 MG PO TABS: 4.0000 mg | ORAL_TABLET | Freq: Four times a day (QID) | ORAL | Status: DC | PRN

## 2016-12-17 MED ORDER — DEXTROSE 5 % IV SOLN
10.0000 mg/kg | Freq: Three times a day (TID) | INTRAVENOUS | Status: DC
  Administered 2016-12-18 – 2016-12-19 (×6): 840 mg via INTRAVENOUS
  Filled 2016-12-17 (×8): qty 16.8

## 2016-12-17 MED ORDER — MORPHINE SULFATE (PF) 4 MG/ML IV SOLN
4.0000 mg | Freq: Once | INTRAVENOUS | Status: AC
Start: 2016-12-17 — End: 2016-12-17
  Administered 2016-12-17: 4 mg via INTRAVENOUS
  Filled 2016-12-17: qty 1

## 2016-12-17 MED ORDER — DEXTROSE 5 % IV SOLN
850.0000 mg | Freq: Once | INTRAVENOUS | Status: AC
  Administered 2016-12-17: 850 mg via INTRAVENOUS
  Filled 2016-12-17: qty 17

## 2016-12-17 MED ORDER — MORPHINE SULFATE (PF) 4 MG/ML IV SOLN
4.0000 mg | INTRAVENOUS | Status: DC | PRN
  Administered 2016-12-17: 4 mg via INTRAVENOUS
  Filled 2016-12-17: qty 1

## 2016-12-17 MED ORDER — MORPHINE SULFATE (PF) 2 MG/ML IV SOLN
2.0000 mg | INTRAVENOUS | Status: DC | PRN
Start: 2016-12-17 — End: 2016-12-18

## 2016-12-17 MED ORDER — VANCOMYCIN HCL IN DEXTROSE 1-5 GM/200ML-% IV SOLN
1000.0000 mg | Freq: Once | INTRAVENOUS | Status: AC
  Administered 2016-12-17: 1000 mg via INTRAVENOUS

## 2016-12-17 MED ORDER — ONDANSETRON HCL 4 MG/2ML IJ SOLN
4.0000 mg | Freq: Four times a day (QID) | INTRAMUSCULAR | Status: DC | PRN
  Administered 2016-12-19 (×2): 4 mg via INTRAVENOUS
  Filled 2016-12-17 (×2): qty 2

## 2016-12-17 MED ORDER — DEXTROSE 5 % IV SOLN
10.0000 mg/kg | Freq: Once | INTRAVENOUS | Status: DC
  Filled 2016-12-17: qty 16.8

## 2016-12-17 NOTE — ED Provider Notes (Signed)
Was asked by AD to review culture data.   Based on chart review, patient was seen and evaluated for fevers and noted to have facial cellulitis. He is otherwise nontoxic-appearing and was discharged home on doxycycline. This was on 6/19.  Blood cultures positive for methicillin-resistant coag negative staph.  Coag negative staph is generally a contaminant. I have instructed nursing to call patient. If he is no longer febrile and cellulitis improving, he can return first thing in the morning for repeat blood cultures. If he is ill or has worsened he needs to return immediately.   Shon BatonHorton, Ranetta Armacost F, MD 12/17/16 [phone]

## 2016-12-17 NOTE — Progress Notes (Addendum)
Pharmacy Note:  Initial antibiotics for Acyclovir, Vancomycin and Zosyn  ordered by EDP for shingles, Methicillin Resistant Coag (-) Staph BCx 1 of 2, and cellulitis.  Estimated Creatinine Clearance: 71.4 mL/min (by C-G formula based on SCr of 0.89 mg/dL).   Body mass index is 25.09 kg/m.  Allergies  Allergen Reactions  . Statins     Possible cause of weakness    Vitals:   12/17/16 1400 12/17/16 1430  BP: (!) 158/51 (!) 127/53  Pulse: 61 64  Resp: 14 14  Temp:      Anti-infectives    Start     Dose/Rate Route Frequency Ordered Stop   12/17/16 1500  acyclovir (ZOVIRAX) 840 mg in dextrose 5 % 150 mL IVPB     10 mg/kg  83.9 kg 166.8 mL/hr over 60 Minutes Intravenous Once 12/17/16 1452     12/17/16 1415  vancomycin (VANCOCIN) 500 mg in sodium chloride 0.9 % 100 mL IVPB     500 mg 100 mL/hr over 60 Minutes Intravenous  Once 12/17/16 1303     12/17/16 1315  vancomycin (VANCOCIN) IVPB 1000 mg/200 mL premix     1,000 mg 200 mL/hr over 60 Minutes Intravenous  Once 12/17/16 1303 12/17/16 1452   12/17/16 1300  vancomycin (VANCOCIN) 1,500 mg in sodium chloride 0.9 % 500 mL IVPB  Status:  Discontinued     1,500 mg 250 mL/hr over 120 Minutes Intravenous  Once 12/17/16 1257 12/17/16 1302   12/17/16 1230  piperacillin-tazobactam (ZOSYN) IVPB 3.375 g     3.375 g 100 mL/hr over 30 Minutes Intravenous  Once 12/17/16 1227 12/17/16 1343   12/17/16 1230  vancomycin (VANCOCIN) IVPB 1000 mg/200 mL premix  Status:  Discontinued     1,000 mg 200 mL/hr over 60 Minutes Intravenous  Once 12/17/16 1227 12/17/16 1257     Plan: Initial doses X 1 ordered (see above) . F/U admission orders for further dosing if therapy continued.  Mady GemmaHayes, Destyn Parfitt R, [facility] 12/17/2016 2:54 PM

## 2016-12-17 NOTE — ED Notes (Signed)
Entire right face with redness, swelling, and weeping.  Redness extends to neck.  Right eye is swollen close but pt abl to open. Lips are swollen with eschar noted.

## 2016-12-17 NOTE — ED Notes (Signed)
Transported to [place].  

## 2016-12-17 NOTE — ED Notes (Signed)
Brace to right arm PTA.

## 2016-12-17 NOTE — ED Notes (Signed)
Iv's dated 12-15-16 not present.

## 2016-12-17 NOTE — Consult Note (Signed)
Ophthalmology Consult   Trevor Brock is a 83 y.o. male with medical history of dementia, hyperlipidemia, and pancytopenia presented with approximately 2-3 day history of right facial pain. Unfortunately, the patient has advanced dementia, and he is unable to provide any significant history.  Patient was first seen 6/19 with pain and started on doxycycline.  It has progressed since then and pt was diagnosed with Herpes Zoster today.  Pt is nonverbal at this time and has severe dementia so exam was limited.  Pt vision could not be assessed.  Pupils were equally round and reactive to light, extraocular motility was full grossly by observing random eye movements.  Pressure was 17 in the right eye were equal to palpation.  The pt has vesicles and erythyma of the right face following the V2 dermatome.  V1 Is not affected and there are no lesions on the tip of the nose (Hutchinsons sign).  The patient does have upper eyelid swelling but this appears to be edema carrying over from lower eyelid.  There is no redness or vesicles to suggest upper eyelid involvement.  The right eye had minimal injection but did have a small subconjunctival hemorrhage which is most likely secondary to rubbing from the facial irritation.  There is no conjunctival or corneal involvement.  The patient has cataracts in both eyes and the right eye was dilated to rule out intraocular involvement.  The retina has scars from previous retina surgery but no signs of inflammation.    A/P 1.Herpes Zoster involving the V2 dermatome of the right face sparing the eye.  Eye involvement typically involves V1 which is not involved in this patient.  Pt does have periorbital edema and lower eyelid involvement.  This will cause irritation of the conjunctiva due to poor tear film and ectropion from lower eyelid swelling.  Pt to use Erythromycin ointment 4x a day in the right eye.  If the condition worsens and the eye appears to be involved please  reconsult and we will gladly see the patient.  Once swelling resolves can stop the erythromycin.  Pt is on acyclovir and IV antibiotics.  Thank you for giving me the opportunity to participate in the care of this patient.  Please feel free to contact me if you have any concerns.    Trevor Brock, M.D. Cell [phone] Office [phone]

## 2016-12-17 NOTE — Consult Note (Signed)
Consultation Note Date: 12/17/2016   Patient Name: Trevor Brock  DOB: 07/08/1932  MRN: [id number]  Age / Sex: 83 y.o., male  PCP: Benita Stabile, MD Referring Physician: Catarina Hartshorn, MD  Reason for Consultation: Establishing goals of care and Psychosocial/spiritual support  HPI/Patient Profile: 83 y.o. male  with past medical history of End-stage dementia, nonverbal mostly, SNF resident times 2 months, hypothyroidism, increased cholesterol, history of left-sided facial cellulitis 1 month ago, history of fall with wrist fracture in skin take your 3 weeks ago admitted on 12/17/2016 with right facial shingles with ophthalmic involvement.   Clinical Assessment and Goals of Care: Mr. Sibyl Parr is lying quietly in bed. He briefly makes but does not keep eye contact. He is nonverbal. Present today at bedside is son, Ivin Booty. Arlys John and I go to a private room for a discussion.   Arlys John states that his father has had a decline in his health over the last 2 months, and has been a resident of [facility] [place] for several months now. He shares that Mr. Sibyl Parr came to the hospital after an illness and was admitted to [facility]. Mr. Sibyl Parr was then transferred to [facility] center as a resident. Arlys John states that his father had this same type of cellulitis on his left side of his face 1 month ago.  He also fell 3 weeks ago and broke his wrist/had a skin tear.  Arlys John states that he is concerned about his father's declines and his suffering. He states, "my dad is dying". I share a diagram of the chronic illness pathway, and we talk about what is normal and expected. We talk about the changes for people with dementia. We briefly discussed the MOST form, and talk about making decisions for the future. Arlys John states his mother had been caring for Mr. Sibyl Parr, but she is frail herself, using a walker. Arlys John agrees for Mr. Sibyl Parr to be  transferred to [facility] for specialized treatment for his shingles with ophthalmic involvement. Arlys John states that his father will not be able to participate in detailed eye exams, he has not been able to follow directions for several months now.  Arlys John requests that we have a family meeting here at [facility] tomorrow 6/22 around 1230 or 1 PM. He states it would be easier for his mother and his siblings to meet here at [facility].   Healthcare power of attorney HCPOA - son Earlie Lou is at bedside at this time. He shares that his sister, Harrold Donath is healthcare and durable power of attorney. There are 3 son's Boris Lown, and [employer].  Wife Talbert Forest is 61 years old.   SUMMARY OF RECOMMENDATIONS   continue to treat the treatable, the Arlys John states he wants to make sure his father's pain is meant well-managed.  code status not discussed with son Arlys John today, he is not healthcare power of attorney.   Code Status/Advance Care Planning:  Full code  Symptom Management:   per hospitalist, no additional needs at this time.  Palliative Prophylaxis:  Eye Care and Frequent Pain Assessment  Additional Recommendations (Limitations, Scope, Preferences):  Continue to treat the treatable, with the balance of pain control  Psycho-social/Spiritual:   Desire for further Chaplaincy support:no  Additional Recommendations: Caregiving  Support/Resources and Education on Hospice  Prognosis:   < 6 months, would not be surprising based on functional decline, end-stage dementia.  Discharge Planning: To be determined, [facility] [place] [facility]       Primary Diagnoses: Present on Admission: . Facial cellulitis . Herpes zoster ophthalmicus of right eye . Pancytopenia (HCC) . Dementia   I have reviewed the medical record, interviewed the patient and family, and examined the patient. The following aspects are pertinent.  Past Medical History:  Diagnosis Date  . Assistance needed for  mobility    walks with walker  . Dementia   . Depression   . Hyperlipidemia    Statin intolerance as leg weakness  . Hypothyroid   . Mild memory disturbance   . Thrombocytopenia [facility](HCC)    Social History   Social History  . Marital status: Married    Spouse name: N/A  . Number of children: N/A  . Years of education: N/A   Social History Main Topics  . Smoking status: Never Smoker  . Smokeless tobacco: Never Used  . Alcohol use No  . Drug use: No  . Sexual activity: Not Asked   Other Topics Concern  . None   Social History Narrative  . None   Family History  Problem Relation Age of Onset  . Cancer Mother        Diffuse metastases of unknown primary  . Cancer Sister        Breast  . Cancer Brother        Gastrointestinal  . Stroke Neg Hx   . Diabetes Neg Hx   . Heart disease Neg Hx    Scheduled Meds: Continuous Infusions: . acyclovir 850 mg (12/17/16 1544)  . sodium chloride 1,000 mL (12/17/16 1553)   PRN Meds:.morphine injection Medications Prior to Admission:  Prior to Admission medications   Medication Sig Start Date End Date Taking? Authorizing Provider  acetaminophen (TYLENOL) 325 MG tablet Take 2 tablets (650 mg total) by mouth every 6 (six) hours as needed for moderate pain or fever. 12/16/16  Yes Mesner, Barbara CowerJason, MD  doxycycline (VIBRAMYCIN) 100 MG capsule Take 1 capsule (100 mg total) by mouth 2 (two) times daily. One po bid x 10 days 12/16/16 12/26/16 Yes Mesner, Barbara CowerJason, MD  HYDROcodone-acetaminophen (NORCO) 5-325 MG tablet Take 0.5-1 tablets by mouth every 6 (six) hours as needed for severe pain. 12/16/16  Yes Mesner, Barbara CowerJason, MD  levothyroxine (SYNTHROID, LEVOTHROID) 112 MCG tablet Take one tablet by mouth 30 minutes before breakfast once daily for thyroid 09/15/15  Yes [provider]  sertraline (ZOLOFT) 50 MG tablet Take 1 tablet (50 mg total) by mouth daily. 10/03/15  Yes Dorothea OgleMyers, Iskra M, MD   Allergies  Allergen Reactions  . Statins     Possible  cause of weakness   Review of Systems  Unable to perform ROS: Dementia    Physical Exam  Constitutional: No distress.  HENT:  Head: Normocephalic.  Right face with swelling, crusty, drainage, involving tip of the nose  Cardiovascular: Normal rate and regular rhythm.   Rate in the 50s  Pulmonary/Chest: Effort normal. No respiratory distress.  Abdominal: Soft. He exhibits no distension.  Musculoskeletal: He exhibits no edema.  Neurological: He is alert.  Eyes are open, briefly makes eye contact, nonverbal  Skin: Skin is warm and dry.  Right side of face swollen, crusty, drainage, involving tip of the nose  Nursing note and vitals reviewed.   Vital Signs: BP (!) 153/55   Pulse (!) 58   Temp 98.9 F (37.2 C) (Oral)   Resp 16   Ht 6' (1.829 m)   Wt 83.9 kg (185 lb)   SpO2 99%   BMI 25.09 kg/m  Pain Assessment: 0-10   Pain Score: 0-No pain   SpO2: SpO2: 99 % O2 Device:SpO2: 99 % O2 Flow Rate: .   IO: Intake/output summary:  Intake/Output Summary (Last 24 hours) at 12/17/16 1555 Last data filed at 12/17/16 1550  Gross per 24 hour  Intake             2350 ml  Output                0 ml  Net             2350 ml    LBM:   Baseline Weight: Weight: 83.9 kg (185 lb) Most recent weight: Weight: 83.9 kg (185 lb)     Palliative Assessment/Data:   Flowsheet Rows     Most Recent Value  Intake Tab  Referral Department  -- [ED]  Unit at Time of Referral  ER  Palliative Care Primary Diagnosis  Sepsis/Infectious Disease  Date Notified  12/17/16  Palliative Care Type  New Palliative care  Reason for referral  Clarify Goals of Care  Date of Admission  12/17/16  Date first seen by Palliative Care  12/17/16  # of days Palliative referral response time  0 Day(s)  # of days IP prior to Palliative referral  0  Clinical Assessment  Palliative Performance Scale Score  20%  Pain Max last 24 hours  Not able to report  Pain Min Last 24 hours  Not able to report  Dyspnea Max  Last 24 Hours  Not able to report  Dyspnea Min Last 24 hours  Not able to report  Psychosocial & Spiritual Assessment  Palliative Care Outcomes  Patient/Family meeting held?  Yes  Who was at the meeting?  Son Arlys John at bedside      Time In: 1450 Time Out: 1540 Time Total: 50 minutes Greater than 50%  of this time was spent counseling and coordinating care related to the above assessment and plan.  Signed by: Katheran Awe, NP   Please contact Palliative Medicine Team phone at [phone] for questions and concerns.  For individual provider: See Loretha Stapler

## 2016-12-17 NOTE — ED Provider Notes (Signed)
[facility]-EMER[facility]Y DEPT Provider Note   CSN: [id number] Arrival date & time: 12/17/16  1140     History   Chief Complaint Chief Complaint  Patient presents with  . Cellulitis  . Abnormal Lab    HPI Trevor Brock is a 83 y.o. male.  HPI Patient presents to the emergency room for evaluation of persistent facial cellulitis and positive outpatient blood cultures. Patient presented to the emergency room on June 19 for new onset of fevers associated with facial erythema Past Medical History:  Diagnosis Date  . Assistance needed for mobility    walks with walker  . Dementia   . Depression   . Hyperlipidemia    Statin intolerance as leg weakness  . Hypothyroid   . Mild memory disturbance   . Thrombocytopenia [facility])     Patient Active Problem List   Diagnosis Date Noted  . Generalized weakness 11/11/2016  . Heart murmur 11/11/2016  . Facial cellulitis 11/10/2016  . MRSA carrier 10/22/2015  . Cellulitis and abscess 10/17/2015  . Thrombocytopenia (HCC) 10/17/2015  . Depression 10/17/2015  . Sepsis, unspecified organism (HCC) 10/17/2015  . Malnutrition of moderate degree 10/17/2015  . Anisocoria 10/16/2015  . Pancytopenia (HCC) 10/07/2015  . Syncope 09/28/2015  . Dementia 09/28/2015  . Assistance needed for mobility   . Hyperlipidemia   . Hypothyroid     Past Surgical History:  Procedure Laterality Date  . CHOLECYSTECTOMY    . RETINAL DETACHMENT SURGERY Right   . TONSILLECTOMY         Home Medications    Prior to Admission medications   Medication Sig Start Date End Date Taking? Authorizing Provider  acetaminophen (TYLENOL) 325 MG tablet Take 2 tablets (650 mg total) by mouth every 6 (six) hours as needed for moderate pain or fever. 12/16/16   Mesner, Barbara Cower, MD  doxycycline (VIBRAMYCIN) 100 MG capsule Take 1 capsule (100 mg total) by mouth 2 (two) times daily. One po bid x 10 days 12/16/16 12/26/16  Mesner, Barbara Cower, MD  HYDROcodone-acetaminophen (NORCO) 5-325 MG  tablet Take 0.5-1 tablets by mouth every 6 (six) hours as needed for severe pain. 12/16/16   Mesner, Barbara Cower, MD  levothyroxine (SYNTHROID, LEVOTHROID) 112 MCG tablet Take one tablet by mouth 30 minutes before breakfast once daily for thyroid 09/15/15   [provider]  sertraline (ZOLOFT) 50 MG tablet Take 1 tablet (50 mg total) by mouth daily. 10/03/15   Dorothea Ogle, MD    Family History Family History  Problem Relation Age of Onset  . Cancer Mother        Diffuse metastases of unknown primary  . Cancer Sister        Breast  . Cancer Brother        Gastrointestinal  . Stroke Neg Hx   . Diabetes Neg Hx   . Heart disease Neg Hx     Social History Social History  Substance Use Topics  . Smoking status: Never Smoker  . Smokeless tobacco: Never Used  . Alcohol use No     Allergies   Statins   Review of Systems Review of Systems  Unable to perform ROS: Mental status change     Physical Exam Updated Vital Signs BP (!) 143/57   Pulse (!) 58   Temp 98.9 F (37.2 C) (Oral)   Resp 16   Ht 1.829 m (6')   Wt 83.9 kg (185 lb)   SpO2 99%   BMI 25.09 kg/m   Physical Exam  HENT:  Head: Normocephalic.  Right Ear: External ear normal.  Left Ear: External ear normal.  Erythema of the right check extending down to the neck and the right side of the nose, crusting and weaping from the wound, black crusted eschars noted at the surface, edema of the lip, unable to get patient to open his mouth to visualize the mucous membranes  Eyes: Right eye exhibits no discharge. Left eye exhibits no discharge. No scleral icterus.  Mild injection of the inferior aspect of the right sclera, no drainage  Neck: Neck supple. No tracheal deviation present.  Cardiovascular: Normal rate, regular rhythm and intact distal pulses.   Pulmonary/Chest: Effort normal and breath sounds normal. No stridor. No respiratory distress. He has no wheezes. He has no rales.  Abdominal: Soft. Bowel sounds are  normal. He exhibits no distension. There is no tenderness. There is no rebound and no guarding.  Musculoskeletal: He exhibits no edema or tenderness.  Neurological: Cranial nerve deficit: no facial droop, extraocular movements intact, no slurred speech. He exhibits normal muscle tone. He displays no seizure activity.  Limited exam, pt does not answer questions, moans during exam, does not follow commands, this is baseline per family  Skin: Skin is warm and dry. No rash noted. He is not diaphoretic.  Psychiatric: He has a normal mood and affect.  Nursing note and vitals reviewed.    ED Treatments / Results  Labs (all labs ordered are listed, but only abnormal results are displayed) Labs Reviewed  COMPREHENSIVE METABOLIC PANEL - Abnormal; Notable for the following:       Result Value   Sodium 133 (*)    Glucose, Bld 101 (*)    Calcium 8.5 (*)    Total Protein 6.1 (*)    Albumin 3.1 (*)    Alkaline Phosphatase 148 (*)    All other components within normal limits  CBC WITH DIFFERENTIAL/PLATELET - Abnormal; Notable for the following:    WBC 3.2 (*)    RBC 3.73 (*)    Hemoglobin 12.4 (*)    HCT 36.8 (*)    Platelets 45 (*)    Neutro Abs 1.5 (*)    All other components within normal limits  CULTURE, BLOOD (ROUTINE X 2)  CULTURE, BLOOD (ROUTINE X 2)  I-STAT CG4 LACTIC ACID, ED    EKG  EKG Interpretation None       Radiology Dg Chest 2 View  Result Date: 12/15/2016 CLINICAL DATA:  Fever, cellulitis. EXAM: CHEST  2 VIEW COMPARISON:  Chest radiograph Nov 10, 2016 FINDINGS: Cardiac silhouette is mildly enlarged and unchanged. Tortuous mildly calcified aorta. Low inspiratory examination. Mild bronchitic changes and LEFT lung base strandy densities. No pneumothorax. Osteopenia. IMPRESSION: Mild cardiomegaly and mild bronchitic changes with LEFT lung base atelectasis/scarring. Electronically Signed   By: Awilda Metro M.D.   On: 12/15/2016 22:29   Ct Maxillofacial W  Contrast  Result Date: 12/16/2016 CLINICAL DATA:  Initial evaluation for recent fall with laceration to right side of face, increased swelling with redness and fever. EXAM: CT MAXILLOFACIAL WITH CONTRAST TECHNIQUE: Multidetector CT imaging of the maxillofacial structures was performed. Multiplanar CT image reconstructions were also generated. A small metallic BB was placed on the right temple in order to reliably differentiate right from left. COMPARISON:  Prior CT from 12/03/2016. FINDINGS: Osseous: No acute osseous abnormality about the face. Leftward nasal septal deviation noted. Orbits: Postoperative changes present at the right globe. Globes and orbital soft tissues otherwise within normal limits. Sinuses:  Mild scattered mucosal thickening within the ethmoidal air cells. Paranasal sinuses are otherwise clear. Trace opacity noted within the right mastoid air cells. Left mastoid air cells clear. Middle ear cavities are clear. Soft tissues: Soft tissue swelling with inflammatory stranding present within the right face, involving the right cheek and right masticator space, concerning for cellulitis given the provided history. Overlying skin thickening. Changes extend inferiorly into the anterior aspect of the partially visualized right upper neck. Mild inflammatory stranding within the right retro antral fat. Right parapharyngeal fat preserved at this time without deeper extension into the neck. No discrete abscess or drainable fluid collection identified. No postseptal extension into the bony right orbit. Few mildly prominent right level 1 B lymph nodes, likely reactive. Limited intracranial: Unremarkable. IMPRESSION: Asymmetric soft tissue swelling with inflammatory stranding within the right face, extending inferiorly along the anterior aspect of the upper right neck, concerning for cellulitis given the provided history. No abscess or drainable fluid collection identified. No postseptal extension into the bony  right orbit. Electronically Signed   By: Rise MuBenjamin  McClintock M.D.   On: 12/16/2016 00:08    Procedures Procedures (including critical care time)  Medications Ordered in ED Medications  sodium chloride 0.9 % bolus 1,000 mL (1,000 mLs Intravenous New Bag/Given 12/17/16 1247)    And  sodium chloride 0.9 % bolus 1,000 mL (not administered)    And  sodium chloride 0.9 % bolus 1,000 mL (not administered)  morphine 4 MG/ML injection 4 mg (not administered)  vancomycin (VANCOCIN) IVPB 1000 mg/200 mL premix (1,000 mg Intravenous New Bag/Given 12/17/16 1340)    Followed by  vancomycin (VANCOCIN) 500 mg in sodium chloride 0.9 % 100 mL IVPB (not administered)  piperacillin-tazobactam (ZOSYN) IVPB 3.375 g (0 g Intravenous Stopped 12/17/16 1343)  morphine 4 MG/ML injection 4 mg (4 mg Intravenous Given 12/17/16 1247)     Initial Impression / Assessment and Plan / ED Course  I have reviewed the triage vital signs and the nursing notes.  Pertinent labs & imaging results that were available during my care of the patient were reviewed by me and considered in my medical decision making (see chart for details).  Clinical Course as of Dec 18 1351  Thu Dec 17, 2016  1247 Spoke with family.  They want to make sure his pain is being treated.  They also have interest in a palliative care consult.  [JK]  1352 Labs reviewed.  No signs of sepsis.  Appearance of rash is concerning for shingles as well.  Will cover with acyclovir.  Consult with hospitalist.  [JK]    Clinical Course User Index [JK] Linwood DibblesKnapp, Aleeya Veitch, MD    Patient presents to the emergency room for evaluation of cellulitis associated with positive blood cultures from June 19 showing methicillin-resistant coag negative staph. The patient has been taking doxycycline as an outpatient at the nursing center however symptoms are worsening.  He does appear to have significant facial cellulitis associated with some crusting and scabs.  Plan is to start the  patient on IV Zosyn and vancomycin. I have ordered repeat blood cultures, morphine for pain. I plan on consultation with the medical service for admission. I have also ordered a palliative care consult per the family's request  Final Clinical Impressions(s) / ED Diagnoses   Final diagnoses:  Facial cellulitis  Herpes zoster with other complication    New Prescriptions New Prescriptions   No medications on file     Linwood DibblesKnapp, Alcides Nutting, MD 12/17/16 1353

## 2016-12-17 NOTE — H&P (Addendum)
History and Physical  Trevor Brock:[id number] DOB: 1933/04/09 DOA: 12/17/2016   PCP: Benita Stabile, MD   Patient coming from: [facility] in [place] Chief Complaint: face pain  HPI:  Trevor Brock is a 83 y.o. male with medical history of dementia, hyperlipidemia, and pancytopenia presented with approximately 2-3 day history of right facial pain. Unfortunately, the patient has advanced dementia, and he is unable to provide any significant history.  The patient was seen in the emergency department on 12/15/2016 for fever. At that time, CT of the face was obtained and showed asymmetric soft tissue swelling with inflammatory stranding. Because of concerns for cellulitis, the patient was started on doxycycline and discharged back to his skilled nursing facility.  Unfortunately, the patient continued to complain of increasing pain. The patient's son noted increasing crusting, erythema, and edema of the right side of his face. Blood cultures were obtained on 12/15/2016, and they're currently growing coagulase-negative Staphylococcus in one of 2 sets. Because of his increasing pain, edema, erythema, the patient was brought back to the emergency department for further evaluation. Unfortunately, the patient is unable to clarify if he is having any eye pain or decreased visual acuity. There is been no vomiting, diarrhea, respiratory distress. At baseline, the patient is able to speak although nonsensically.  The patient was recently discharged from the hospital after a stay from 10/31/2016 through 11/13/2016 when he was treated for LEFT facial cellulitis. He was discharged back to SNF with Augmentin. His son states that the left side of his face completely resolved. Unfortunately, the patient had a mechanical fall on 12/04/2016 resulting in a laceration on the right eyebrow. He was brought to the emergency department where his laceration was repaired. He has not had any other mechanical falls since  that episode.  In the emergency department, the patient was afebrile and hemodynamically stable so showing 100% on room air. He was noted to be pancytopenic with WBC 3.2, hemoglobin 12.4, platelets 45,000. Sodium was 133. Otherwise, lytes and hepatic enzymes were unremarkable.  Assessment/Plan: Herpes zoster of the face -Concern about herpes zoster ophthalmicus -case discussed with Dr. Prince Solian graciously agreed to see patient if pt is transferred to [facility] and pt arrives this evening--please contact him [phone] -start IV acyclovir -start erythromycin ointment to right eye  Cellulitis of face/neck -patient has secondary bacterial superinfection superimposed upon VZV -IV vancomycin  Hyponatremia -pt received 3 L of NS in ED -recheck BMP in am  Hypothyroidism -continue synthroid  Dementia without behavioral disturbance/Depression -continue zoloft  Pancytopenia -chronic -check serum B12  Right Wrist Fracture -s/p unwitnessed mechanical fall on 6/7 -currently in splint       Past Medical History:  Diagnosis Date  . Assistance needed for mobility    walks with walker  . Dementia   . Depression   . Hyperlipidemia    Statin intolerance as leg weakness  . Hypothyroid   . Mild memory disturbance   . Thrombocytopenia (HCC)    Past Surgical History:  Procedure Laterality Date  . CHOLECYSTECTOMY    . RETINAL DETACHMENT SURGERY Right   . TONSILLECTOMY     Social History:  reports that he has never smoked. He has never used smokeless tobacco. He reports that he does not drink alcohol or use drugs.   Family History  Problem Relation Age of Onset  . Cancer Mother        Diffuse metastases of unknown primary  .  Cancer Sister        Breast  . Cancer Brother        Gastrointestinal  . Stroke Neg Hx   . Diabetes Neg Hx   . Heart disease Neg Hx      Allergies  Allergen Reactions  . Statins     Possible cause of weakness     Prior to  Admission medications   Medication Sig Start Date End Date Taking? Authorizing Provider  acetaminophen (TYLENOL) 325 MG tablet Take 2 tablets (650 mg total) by mouth every 6 (six) hours as needed for moderate pain or fever. 12/16/16   Mesner, Barbara Cower, MD  doxycycline (VIBRAMYCIN) 100 MG capsule Take 1 capsule (100 mg total) by mouth 2 (two) times daily. One po bid x 10 days 12/16/16 12/26/16  Mesner, Barbara Cower, MD  HYDROcodone-acetaminophen (NORCO) 5-325 MG tablet Take 0.5-1 tablets by mouth every 6 (six) hours as needed for severe pain. 12/16/16   Mesner, Barbara Cower, MD  levothyroxine (SYNTHROID, LEVOTHROID) 112 MCG tablet Take one tablet by mouth 30 minutes before breakfast once daily for thyroid 09/15/15   [provider]  sertraline (ZOLOFT) 50 MG tablet Take 1 tablet (50 mg total) by mouth daily. 10/03/15   Dorothea Ogle, MD    Review of Systems:  Unobtainable secondary to patient's mental status and advanced dementia  Physical Exam: Vitals:   12/17/16 1300 12/17/16 1330 12/17/16 1400 12/17/16 1430  BP: (!) 141/55 (!) 143/57 (!) 158/51 (!) 127/53  Pulse: (!) 56 (!) 58 61 64  Resp: 14 16 14 14   Temp:      TempSrc:      SpO2: 98% 99% 100% 100%  Weight:      Height:       General:  Awake and alert, NAD, nontoxic, pleasantly confused Head/Eye: No conjunctival hemorrhage, no icterus, [place]/AT, No nystagmus ENT:  Erythema about the face with crusting lesions extending from the nose laterally to the right with associated edema and clear drainage. There is also mild erythema and anterior surface of the neck.--See pictures below Neck:  No masses, no lymphadenpathy, no bruits; mild erythema without crepitance.  CV:  RRR, no rub, no gallop, no S3 Lung:  CTAB, good air movement, no wheeze, no rhonchi Abdomen: soft/NT, +BS, nondistended, no peritoneal signs Ext: No cyanosis, No rashes, No petechiae, No lymphangitis, No edema Neuro:  strength 4/5 in bilateral upper and lower extremities, no  dysmetria       Labs on Admission:  Basic Metabolic Panel:  Recent Labs Lab 12/15/16 2130 12/17/16 1247  NA 132* 133*  K 4.1 4.1  CL 103 103  CO2 21* 23  GLUCOSE 105* 101*  BUN 14 13  CREATININE 0.98 0.89  CALCIUM 8.2* 8.5*   Liver Function Tests:  Recent Labs Lab 12/15/16 2130 12/17/16 1247  AST 40 39  ALT 24 24  ALKPHOS 136* 148*  BILITOT 0.9 1.0  PROT 5.8* 6.1*  ALBUMIN 3.1* 3.1*   No results for input(s): LIPASE, AMYLASE in the last 168 hours. No results for input(s): AMMONIA in the last 168 hours. CBC:  Recent Labs Lab 12/15/16 2130 12/17/16 1247  WBC 3.0* 3.2*  NEUTROABS 1.7 1.5*  HGB 11.8* 12.4*  HCT 35.2* 36.8*  MCV 99.7 98.7  PLT 47* 45*   Coagulation Profile:  Recent Labs Lab 12/15/16 2130  INR 1.11   Cardiac Enzymes: No results for input(s): CKTOTAL, CKMB, CKMBINDEX, TROPONINI in the last 168 hours. BNP: Invalid input(s): POCBNP CBG: No  results for input(s): GLUCAP in the last 168 hours. Urine analysis:    Component Value Date/Time   COLORURINE YELLOW 12/15/2016 2112   APPEARANCEUR CLEAR 12/15/2016 2112   LABSPEC 1.020 12/15/2016 2112   PHURINE 6.0 12/15/2016 2112   GLUCOSEU NEGATIVE 12/15/2016 2112   HGBUR SMALL (A) 12/15/2016 2112   BILIRUBINUR NEGATIVE 12/15/2016 2112   KETONESUR NEGATIVE 12/15/2016 2112   PROTEINUR NEGATIVE 12/15/2016 2112   NITRITE NEGATIVE 12/15/2016 2112   LEUKOCYTESUR NEGATIVE 12/15/2016 2112   Sepsis Labs: @LABRCNTIP (procalcitonin:4,lacticidven:4) ) Recent Results (from the past 240 hour(s))  Culture, blood (Routine x 2)     Status: None (Preliminary result)   Collection Time: 12/15/16  9:30 PM  Result Value Ref Range Status   Specimen Description BLOOD RIGHT HAND  Final   Special Requests   Final    BOTTLES DRAWN AEROBIC AND ANAEROBIC Blood Culture adequate volume   Culture NO GROWTH 2 DAYS  Final   Report Status PENDING  Incomplete  Culture, blood (Routine x 2)     Status: None  (Preliminary result)   Collection Time: 12/15/16  9:30 PM  Result Value Ref Range Status   Specimen Description BLOOD LEFT HAND  Final   Special Requests   Final    BOTTLES DRAWN AEROBIC AND ANAEROBIC Blood Culture adequate volume   Culture  Setup Time   Final    GRAM POSITIVE COCCI recovered from anarobic bottle Gram Stain Report Called to,Read Back By and Verified With: Cruise,[email or web address]  by [email or web address],b 6.20.18 [facility] IN BOTH AEROBIC AND ANAEROBIC BOTTLES    Culture   Final    GRAM POSITIVE COCCI CULTURE REINCUBATED FOR BETTER GROWTH Performed at [facility], [address] [place], [place] [address]    Report Status PENDING  Incomplete  Blood Culture ID Panel (Reflexed)     Status: Abnormal   Collection Time: 12/15/16  9:30 PM  Result Value Ref Range Status   Enterococcus species NOT DETECTED NOT DETECTED Final   Listeria monocytogenes NOT DETECTED NOT DETECTED Final   Staphylococcus species DETECTED (A) NOT DETECTED Final    Comment: Methicillin (oxacillin) resistant coagulase negative staphylococcus. Possible blood culture contaminant (unless isolated from more than one blood culture draw or clinical case suggests pathogenicity). No antibiotic treatment is indicated for blood  culture contaminants. CRITICAL RESULT CALLED TO, READ BACK BY AND VERIFIED WITH: K.Emeline GinsLANE,RN [id number] 12/17/16 M.CAMPBELL    Staphylococcus aureus NOT DETECTED NOT DETECTED Final   Methicillin resistance DETECTED (A) NOT DETECTED Final    Comment: CRITICAL RESULT CALLED TO, READ BACK BY AND VERIFIED WITH: K.Emeline GinsLANE,RN [id number] 12/17/16 M.CAMPBELL    Streptococcus species NOT DETECTED NOT DETECTED Final   Streptococcus agalactiae NOT DETECTED NOT DETECTED Final   Streptococcus pneumoniae NOT DETECTED NOT DETECTED Final   Streptococcus pyogenes NOT DETECTED NOT DETECTED Final   Acinetobacter baumannii NOT DETECTED NOT DETECTED Final   Enterobacteriaceae species NOT DETECTED NOT DETECTED Final    Enterobacter cloacae complex NOT DETECTED NOT DETECTED Final   Escherichia coli NOT DETECTED NOT DETECTED Final   Klebsiella oxytoca NOT DETECTED NOT DETECTED Final   Klebsiella pneumoniae NOT DETECTED NOT DETECTED Final   Proteus species NOT DETECTED NOT DETECTED Final   Serratia marcescens NOT DETECTED NOT DETECTED Final   Haemophilus influenzae NOT DETECTED NOT DETECTED Final   Neisseria meningitidis NOT DETECTED NOT DETECTED Final   Pseudomonas aeruginosa NOT DETECTED NOT DETECTED Final   Candida albicans NOT DETECTED NOT DETECTED Final   Candida glabrata  NOT DETECTED NOT DETECTED Final   Candida krusei NOT DETECTED NOT DETECTED Final   Candida parapsilosis NOT DETECTED NOT DETECTED Final   Candida tropicalis NOT DETECTED NOT DETECTED Final    Comment: Performed at [facility], [address], [place], [place] [address]  Blood Culture (routine x 2)     Status: None (Preliminary result)   Collection Time: 12/17/16 12:48 PM  Result Value Ref Range Status   Specimen Description BLOOD RIGHT ARM  Final   Special Requests   Final    BOTTLES DRAWN AEROBIC AND ANAEROBIC Blood Culture adequate volume   Culture PENDING  Incomplete   Report Status PENDING  Incomplete  Blood Culture (routine x 2)     Status: None (Preliminary result)   Collection Time: 12/17/16 12:53 PM  Result Value Ref Range Status   Specimen Description BLOOD LEFT ARM  Final   Special Requests   Final    BOTTLES DRAWN AEROBIC AND ANAEROBIC Blood Culture adequate volume   Culture PENDING  Incomplete   Report Status PENDING  Incomplete     Radiological Exams on Admission: Dg Chest 2 View  Result Date: 12/15/2016 CLINICAL DATA:  Fever, cellulitis. EXAM: CHEST  2 VIEW COMPARISON:  Chest radiograph Nov 10, 2016 FINDINGS: Cardiac silhouette is mildly enlarged and unchanged. Tortuous mildly calcified aorta. Low inspiratory examination. Mild bronchitic changes and LEFT lung base strandy densities. No pneumothorax.  Osteopenia. IMPRESSION: Mild cardiomegaly and mild bronchitic changes with LEFT lung base atelectasis/scarring. Electronically Signed   By: Awilda Metro M.D.   On: 12/15/2016 22:29   Ct Maxillofacial W Contrast  Result Date: 12/16/2016 CLINICAL DATA:  Initial evaluation for recent fall with laceration to right side of face, increased swelling with redness and fever. EXAM: CT MAXILLOFACIAL WITH CONTRAST TECHNIQUE: Multidetector CT imaging of the maxillofacial structures was performed. Multiplanar CT image reconstructions were also generated. A small metallic BB was placed on the right temple in order to reliably differentiate right from left. COMPARISON:  Prior CT from 12/03/2016. FINDINGS: Osseous: No acute osseous abnormality about the face. Leftward nasal septal deviation noted. Orbits: Postoperative changes present at the right globe. Globes and orbital soft tissues otherwise within normal limits. Sinuses: Mild scattered mucosal thickening within the ethmoidal air cells. Paranasal sinuses are otherwise clear. Trace opacity noted within the right mastoid air cells. Left mastoid air cells clear. Middle ear cavities are clear. Soft tissues: Soft tissue swelling with inflammatory stranding present within the right face, involving the right cheek and right masticator space, concerning for cellulitis given the provided history. Overlying skin thickening. Changes extend inferiorly into the anterior aspect of the partially visualized right upper neck. Mild inflammatory stranding within the right retro antral fat. Right parapharyngeal fat preserved at this time without deeper extension into the neck. No discrete abscess or drainable fluid collection identified. No postseptal extension into the bony right orbit. Few mildly prominent right level 1 B lymph nodes, likely reactive. Limited intracranial: Unremarkable. IMPRESSION: Asymmetric soft tissue swelling with inflammatory stranding within the right face,  extending inferiorly along the anterior aspect of the upper right neck, concerning for cellulitis given the provided history. No abscess or drainable fluid collection identified. No postseptal extension into the bony right orbit. Electronically Signed   By: Rise Mu M.D.   On: 12/16/2016 00:08       Time spent:60 minutes Code Status:   FULL Family Communication:  Son updated at bedside Disposition Plan: expect 2-3 day hospitalization Consults called:  none DVT Prophylaxis: SCDs  Hailynn Slovacek, DO  [facility] Pager [phone]  If 7PM-7AM, please contact night-coverage [email or web address] Password [facility] 12/17/2016, 2:56 PM

## 2016-12-17 NOTE — Progress Notes (Signed)
Pharmacy Antibiotic Note  Trevor Brock is a [id number] y.o. male admitted on 12/17/2016 with Cellulitis of face/neck and shingles (herpes zoster) of the face,concerned about herpes zoster ophthalmicus. .  Pharmacy has been consulted for Vancomycin dosing for cellulitis of face/neck.  Patient received vancomycin 1000 mg + 500mg  IVPB given  @ 13:40 & 14:51 on 12/17/16, zosyn x1 and [facility]. Also to continue  Acyclovir 10mg /kg IV q8h  6/19 BCx: CONS. 1of 2 bottles  PMH:  End-stage dementia, nonverbal mostly, SNF resident times 2 months, hypothyroidism, increased cholesterol, history of left-sided facial cellulitis 1 month ago, history of fall with wrist fracture in skin take your 3 weeks ago admitted on 12/17/2016 with right facial shingles with ophthalmic involvement.   Plan: Vancomycin 1500 mg IV q24h  Monitor clinical status, renal function, cultures, steady state vancomycin trough.    Height: 6' (182.9 cm) Weight: 185 lb (83.9 kg) IBW/kg (Calculated) : 77.6  Temp (24hrs), Avg:98.1 F (36.7 C), Min:97.2 F (36.2 C), Max:98.9 F (37.2 C)   Recent Labs Lab 12/15/16 2125 12/15/16 2130 12/17/16 1247 12/17/16 1258  WBC  --  3.0* 3.2*  --   CREATININE  --  0.98 0.89  --   LATICACIDVEN 1.25  --   --  1.73    Estimated Creatinine Clearance: 71.4 mL/min (by C-G formula based on SCr of 0.89 mg/dL).    Allergies  Allergen Reactions  . Statins     Possible cause of weakness    Antimicrobials this admission: Vanc 6/21>>  Zosyn 6/21 x 1  Acyclovir 6/21 x 1 Dose adjustments this admission:   Microbiology results: 6/19 BCx: CONS. 1of 2 bottles 6/21 BCx: sent 6/21 MRSA PCR:   Thank you for allowing pharmacy to be a part of this patient's care. Noah Delaineuth Arsh Feutz, RPh Clinical Pharmacist Pager: [phone] 8A-3:30P [phone] 3:30P-10P [phone] Main Pharmacy [phone]  12/17/2016 7:51 PM

## 2016-12-17 NOTE — ED Notes (Signed)
Transfer canceled due to pt needing to be in a negative pressure room.  Hospitalist notified.  Bed request re-submitted.

## 2016-12-17 NOTE — ED Notes (Signed)
Blood culture results reviewed by Dr. Wilkie AyeHorton.  Phone call to [facility] by this nurse and spoke to [place] who reports pt is still on Doxycycline.   Relay to [place] that patient will need to have re-collection of blood cultures x 2 to confirm if improvement of if positive cultures were contaminant.  Carollee HerterShannon reports she will let the primary nurse know and to bring pt back to the e.d. If patient not improving and to repeat blood cultures.

## 2016-12-17 NOTE — ED Triage Notes (Signed)
[facility] returns [facility] for eval of worsening cellulitis to face and positive blood cultures.

## 2016-12-17 NOTE — ED Notes (Signed)
[name] (son)  [phone]

## 2016-12-18 ENCOUNTER — Encounter (HOSPITAL_COMMUNITY): Payer: Self-pay | Admitting: *Deleted

## 2016-12-18 DIAGNOSIS — B0229 Other postherpetic nervous system involvement: Secondary | ICD-10-CM

## 2016-12-18 LAB — BASIC METABOLIC PANEL
Anion gap: 6 (ref 5–15)
BUN: 8 mg/dL (ref 6–20)
CALCIUM: 7.9 mg/dL — AB (ref 8.9–10.3)
CO2: 21 mmol/L — ABNORMAL LOW (ref 22–32)
CREATININE: 0.9 mg/dL (ref 0.61–1.24)
Chloride: 104 mmol/L (ref 101–111)
GFR calc Af Amer: >60 mL/min (ref >60)
GFR calc non Af Amer: >60 mL/min (ref >60)
GLUCOSE: 96 mg/dL (ref 65–99)
Potassium: 4.1 mmol/L (ref 3.5–5.1)
Sodium: 131 mmol/L — ABNORMAL LOW (ref 135–145)

## 2016-12-18 LAB — CBC
HCT: 32.3 % — ABNORMAL LOW (ref 39.0–52.0)
Hemoglobin: 10.7 g/dL — ABNORMAL LOW (ref 13.0–17.0)
MCH: 32.9 pg (ref 26.0–34.0)
MCHC: 33.1 g/dL (ref 30.0–36.0)
MCV: 99.4 fL (ref 78.0–100.0)
Platelets: 54 10*3/uL — ABNORMAL LOW (ref 150–400)
RBC: 3.25 MIL/uL — ABNORMAL LOW (ref 4.22–5.81)
RDW: 14.7 % (ref 11.5–15.5)
WBC: 4 10*3/uL (ref 4.0–10.5)

## 2016-12-18 LAB — VITAMIN B12: VITAMIN B 12: 435 pg/mL (ref 180–914)

## 2016-12-18 LAB — CULTURE, BLOOD (ROUTINE X 2): Special Requests: ADEQUATE

## 2016-12-18 LAB — MRSA PCR SCREENING: MRSA BY PCR: POSITIVE — AB

## 2016-12-18 MED ORDER — SODIUM CHLORIDE 0.9 % IV SOLN
INTRAVENOUS | Status: DC
  Administered 2016-12-18 – 2016-12-19 (×2): via INTRAVENOUS

## 2016-12-18 MED ORDER — MORPHINE SULFATE (PF) 2 MG/ML IV SOLN
1.0000 mg | INTRAVENOUS | Status: DC | PRN
  Administered 2016-12-18 – 2016-12-19 (×4): 1 mg via INTRAVENOUS
  Filled 2016-12-18 (×4): qty 1

## 2016-12-18 MED ORDER — PIPERACILLIN-TAZOBACTAM 3.375 G IVPB
3.3750 g | Freq: Three times a day (TID) | INTRAVENOUS | Status: DC
  Administered 2016-12-18 – 2016-12-19 (×4): 3.375 g via INTRAVENOUS
  Filled 2016-12-18 (×6): qty 50

## 2016-12-18 NOTE — Progress Notes (Signed)
PROGRESS NOTE    Trevor Brock  WUJ:[id number] DOB: 05-Sep-1932 DOA: 12/17/2016 PCP: [name], [name], MD  Outpatient Specialists:     Brief Narrative:  83 y/o ? Moderate to severe dementia baseline Depression 6 emergency visits [facility] in June 2018 with falls Admission 5/15-5/18 left-sided facial cellulitis, low-grade fever-DC To SNF Augmentin Prior admission 09/2015 left buttock abscess status post Rx's doxycycline Chronic thrombocytopenia, platelet range in the 50s Moderate to severe malnutrition  Admitted initially at [facility] 6/19 and [facility] secondary to facial cellulitis, likely herpes zoster involvement of trigeminal nerve Patient was first seen in the emergency room 6/19 started doxycycline He progressed quickly and was sent [facility] with pathogen on the  rash&blisters  Ophthalmology saw the patient in consult recommended treatment as below   Assessment & Plan:   Active Problems:   Dementia   Pancytopenia (HCC)   Facial cellulitis   Herpes zoster ophthalmicus of right eye   Hyponatremia   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 Vonna KotykBevis involvement, patient will be revisited 6/25 by him  Keep on airborne precautions until crusting over of lesion heals  Moderate to severe dementia Complications of behavioral disturbances and dementia  No family present  Sertraline 50 daily   Palliative care [facility] reveals possibly would be a good candidate [facility] once stabilizes from acute treatment  He is DO NOT RESUSCITATE   Hypovolemic hyponatremia  No urine studies  however patient septic on admission  Follow trend of labs, saline 50 cc per hour  Hypothyroidism  Continue Synthroid 112 g a.m.     Anticoagulation is relatively contraindicated with thrombocytopenia No family present at the bedside Inpatient pending resolution Disposition likely back to [facility] with hospice in 3-4 days  Consultants:   Ophthalmology  Procedures:     Antimicrobials:   Vancomycin, Zosyn, acyclovir, erythromycin 6/21--->    Subjective: Nonverbal Pulling at his face and at condom catheter Patient examined with ophthalmologist in the room  Objective: Vitals:   12/17/16 1847 12/17/16 2155 12/18/16 0642 12/18/16 0957  BP: 135/60 (!) 116/48 (!) 103/45 (!) 135/47  Pulse: 70 70 69 72  Resp:  18 17 18   Temp: 97.2 F (36.2 C) 97.5 F (36.4 C) 98 F (36.7 C) 98 F (36.7 C)  TempSrc: Oral Oral Oral Oral  SpO2: 98% 98% 99% 98%  Weight:  85.7 kg (188 lb 15 oz)    Height:        Intake/Output Summary (Last 24 hours) at 12/18/16 1617 Last data filed at 12/18/16 1300  Gross per 24 hour  Intake             1510 ml  Output              925 ml  Net              585 ml   Filed Weights   12/17/16 1141 12/17/16 2155  Weight: 83.9 kg (185 lb) 85.7 kg (188 lb 15 oz)    Examination:  Confluent area below right lower eyelid has crusting and scab, it does not lose however Right  eye has a brisk pupillary reflex, conjunctiva slightly pink, hyphema on temporal area of outside of iris Neck is soft supple S1-S2 no murmur rub or gallop Abdomen is soft nontender nondistended no rebound no guarding Neuro hard to determine   Data Reviewed: I have personally reviewed following labs and imaging studies  CBC:  Recent Labs Lab 12/15/16 2130 12/17/16 1247 12/18/16 0651  WBC 3.0* 3.2* 4.0  NEUTROABS 1.7 1.5*  --   HGB 11.8* 12.4* 10.7*  HCT 35.2* 36.8* 32.3*  MCV 99.7 98.7 99.4  PLT 47* 45* 54*   Basic Metabolic Panel:  Recent Labs Lab  12/15/16 2130 12/17/16 1247 12/18/16 0651  NA 132* 133* 131*  K 4.1 4.1 4.1  CL 103 103 104  CO2 21* 23 21*  GLUCOSE 105* 101* 96  BUN 14 13 8   CREATININE 0.98 0.89 0.90  CALCIUM 8.2* 8.5* 7.9*   GFR: Estimated Creatinine Clearance: 70.7 mL/min (by C-G formula based on SCr of 0.9 mg/dL). Liver Function Tests:  Recent Labs Lab 12/15/16 2130 12/17/16 1247  AST 40 39  ALT 24 24  ALKPHOS 136* 148*  BILITOT 0.9 1.0  PROT 5.8* 6.1*  ALBUMIN 3.1* 3.1*   No results for input(s): LIPASE, AMYLASE in the last 168 hours. No results for input(s): AMMONIA in the last 168 hours. Coagulation Profile:  Recent Labs Lab 12/15/16 2130  INR 1.11   Cardiac Enzymes: No results for input(s): CKTOTAL, CKMB, CKMBINDEX, TROPONINI in the last 168 hours. BNP (last 3 results) No results for input(s): PROBNP in the last 8760 hours. HbA1C: No results for input(s): HGBA1C in the last 72 hours. CBG: No results for input(s): GLUCAP in the last 168 hours. Lipid Profile: No results for input(s): CHOL, HDL, LDLCALC, TRIG, CHOLHDL, LDLDIRECT in the last 72 hours. Thyroid Function Tests: No results for input(s): TSH, T4TOTAL, FREET4, T3FREE, THYROIDAB in the last 72 hours. Anemia Panel:  Recent Labs  12/18/16 0651  VITAMINB12 435   Urine analysis:    Component Value Date/Time   COLORURINE YELLOW 12/15/2016 2112   APPEARANCEUR CLEAR 12/15/2016 2112   LABSPEC 1.020 12/15/2016 2112   PHURINE 6.0 12/15/2016 2112   GLUCOSEU NEGATIVE 12/15/2016 2112   HGBUR SMALL (A) 12/15/2016 2112   BILIRUBINUR NEGATIVE 12/15/2016 2112   KETONESUR NEGATIVE 12/15/2016 2112   PROTEINUR NEGATIVE 12/15/2016 2112   NITRITE NEGATIVE 12/15/2016 2112   LEUKOCYTESUR NEGATIVE 12/15/2016 2112   Sepsis Labs: @LABRCNTIP (procalcitonin:4,lacticidven:4)  ) Recent Results (from the past 240 hour(s))  Culture, blood (Routine x 2)     Status: None (Preliminary result)   Collection Time: 12/15/16  9:30 PM  Result Value  Ref Range Status   Specimen Description BLOOD RIGHT HAND  Final   Special Requests   Final    BOTTLES DRAWN AEROBIC AND ANAEROBIC Blood Culture adequate volume   Culture NO GROWTH 3 DAYS  Final   Report Status PENDING  Incomplete  Culture, blood (Routine x 2)     Status: Abnormal   Collection Time: 12/15/16  9:30 PM  Result Value Ref Range Status   Specimen Description BLOOD LEFT HAND  Final   Special Requests   Final    BOTTLES DRAWN AEROBIC AND ANAEROBIC Blood Culture adequate volume   Culture  Setup Time   Final    GRAM POSITIVE COCCI recovered from anarobic bottle Gram Stain Report Called to,Read Back By and Verified With: Cruise,j@[email or web address]  by matthews,b 6.20.18 [facility] hospital IN BOTH AEROBIC AND ANAEROBIC BOTTLES  Culture (A)  Final    STAPHYLOCOCCUS SPECIES (COAGULASE NEGATIVE) THE SIGNIFICANCE OF ISOLATING THIS ORGANISM FROM A SINGLE SET OF BLOOD CULTURES WHEN MULTIPLE SETS ARE DRAWN IS UNCERTAIN. PLEASE NOTIFY THE MICROBIOLOGY DEPARTMENT WITHIN ONE WEEK IF SPECIATION AND SENSITIVITIES ARE REQUIRED. Performed at [facility], [address], [place], [place] [address]    Report Status 12/18/2016 FINAL  Final  Blood Culture ID Panel (Reflexed)     Status: Abnormal   Collection Time: 12/15/16  9:30 PM  Result Value Ref Range Status   Enterococcus species NOT DETECTED NOT DETECTED Final   Listeria monocytogenes NOT DETECTED NOT DETECTED Final   Staphylococcus species DETECTED (A) NOT DETECTED Final    Comment: Methicillin (oxacillin) resistant coagulase negative staphylococcus. Possible blood culture contaminant (unless isolated from more than one blood culture draw or clinical case suggests pathogenicity). No antibiotic treatment is indicated for blood  culture contaminants. CRITICAL RESULT CALLED TO, READ BACK BY AND VERIFIED WITH: K.Emeline Gins [id number] 12/17/16 M.CAMPBELL    Staphylococcus aureus NOT DETECTED NOT DETECTED Final   Methicillin resistance DETECTED (A) NOT  DETECTED Final    Comment: CRITICAL RESULT CALLED TO, READ BACK BY AND VERIFIED WITH: K.Emeline Gins [id number] 12/17/16 M.CAMPBELL    Streptococcus species NOT DETECTED NOT DETECTED Final   Streptococcus agalactiae NOT DETECTED NOT DETECTED Final   Streptococcus pneumoniae NOT DETECTED NOT DETECTED Final   Streptococcus pyogenes NOT DETECTED NOT DETECTED Final   Acinetobacter baumannii NOT DETECTED NOT DETECTED Final   Enterobacteriaceae species NOT DETECTED NOT DETECTED Final   Enterobacter cloacae complex NOT DETECTED NOT DETECTED Final   Escherichia coli NOT DETECTED NOT DETECTED Final   Klebsiella oxytoca NOT DETECTED NOT DETECTED Final   Klebsiella pneumoniae NOT DETECTED NOT DETECTED Final   Proteus species NOT DETECTED NOT DETECTED Final   Serratia marcescens NOT DETECTED NOT DETECTED Final   Haemophilus influenzae NOT DETECTED NOT DETECTED Final   Neisseria meningitidis NOT DETECTED NOT DETECTED Final   Pseudomonas aeruginosa NOT DETECTED NOT DETECTED Final   Candida albicans NOT DETECTED NOT DETECTED Final   Candida glabrata NOT DETECTED NOT DETECTED Final   Candida krusei NOT DETECTED NOT DETECTED Final   Candida parapsilosis NOT DETECTED NOT DETECTED Final   Candida tropicalis NOT DETECTED NOT DETECTED Final    Comment: Performed at [facility], [address] [place], [place] [address]  Blood Culture (routine x 2)     Status: None (Preliminary result)   Collection Time: 12/17/16 12:48 PM  Result Value Ref Range Status   Specimen Description BLOOD RIGHT ARM  Final   Special Requests   Final    BOTTLES DRAWN AEROBIC AND ANAEROBIC Blood Culture adequate volume   Culture NO GROWTH < 24 HOURS  Final   Report Status PENDING  Incomplete  Blood Culture (routine x 2)     Status: None (Preliminary result)   Collection Time: 12/17/16 12:53 PM  Result Value Ref Range Status   Specimen Description BLOOD LEFT ARM  Final   Special Requests   Final    BOTTLES DRAWN AEROBIC AND  ANAEROBIC Blood Culture adequate volume   Culture NO GROWTH < 24 HOURS  Final   Report Status PENDING  Incomplete  MRSA PCR Screening     Status: Abnormal   Collection Time: 12/17/16  7:22 PM  Result Value Ref Range Status   MRSA by PCR POSITIVE (A) NEGATIVE Final    Comment:        The GeneXpert  MRSA Assay (FDA approved for NASAL specimens only), is one component of a comprehensive MRSA colonization surveillance program. It is not intended to diagnose MRSA infection nor to guide or monitor treatment for MRSA infections. RESULT CALLED TO, READ BACK BY AND VERIFIED WITH: C.MARSHALL,RN AT 0314 BY L.PITT 12/18/16          Radiology Studies: No results found.      Scheduled Meds: . erythromycin   Right Eye Q6H  . levothyroxine  112 mcg Oral QAC breakfast  . sertraline  50 mg Oral Daily   Continuous Infusions: . acyclovir Stopped (12/18/16 0926)  . piperacillin-tazobactam (ZOSYN)  IV Stopped (12/18/16 1459)  . vancomycin 1,500 mg (12/18/16 1451)     LOS: 1 day    Time spent: 71    Pleas Koch, MD [facility]   If 7PM-7AM, please contact night-coverage [email or web address] Password [facility] 12/18/2016, 4:17 PM

## 2016-12-18 NOTE — Clinical Social Work Note (Signed)
Clinical Social Work Assessment  Patient Details  Name: Trevor Brock MRN: [id number] Date of Birth: [birth year]  Date of referral:  12/18/16               Reason for consult:  Facility Placement                Permission sought to share information with:  Family Supports Permission granted to share information::  No (CSW talked with patient's wife as he is oriented to person only. Call also made to daughter, Harrold DonathKathy [facility](HCPOA) and message left)  Name::     Charna BusmanShirley Hotard and Harrold DonathKathy Lawrence  Agency::     Relationship::  Wife and Daughter  Contact Information:  Wife - [phone] (h) or [phone] (mobile) and Daughter Olegario MessierKathy [phone]- [phone] (mobile)  Housing/Transportation Living arrangements for the past 2 months:  Skilled Nursing Facility [facility] Source of Information:  Patient Patient Interpreter Needed:  None Criminal Activity/Legal Involvement Pertinent to Current Situation/Hospitalization:  No - Comment as needed Significant Relationships:  Adult Children, Spouse Lives with:  Facility Resident [name] Do you feel safe going back to the place where you live?  Yes (Skilled nursing facility) Need for family participation in patient care:  Yes (Comment)  Care giving concerns:  None expressed by wife regarding care at nursing facility. Wife confirmed that [facility]  Social Worker assessment / plan:  CSW talked with Mrs. Chapa regarding patient's discharge disposition and was informed that [facility] will [facility]. Mrs. Kinnick reported that patient has been there since approx. May 18th. Call also made to daughter and Judie PetitHCPOA Kathy and message left.   Employment status:  Retired Database administratornsurance information:  Managed Medicare, [place] Recommendations:    Information / Referral to community resources:  Skilled Nursing Facility (None needed or requested as [facility])  Patient/Family's Response to care: No concerns expressed by wife regarding care during hospitalization.  Patient/Family's Understanding of and Emotional Response to Diagnosis, Current Treatment, and Prognosis:  Not discussed.  Emotional Assessment Appearance:  Other (Comment Required (Did not meet with patient) Attitude/Demeanor/Rapport:  Unable to Assess Affect (typically observed):  Unable to Assess Orientation:  Oriented to Self Alcohol / Substance use:  Never Used Psych involvement (Current and /or in the community):  No (Comment)  Discharge Needs  Concerns to be addressed:  Discharge Planning Concerns Readmission within the last 30 days:  No Current discharge risk:  None Barriers to Discharge:  No Barriers Identified   Cristobal GoldmannCrawford, Myrical Andujo Bradley, LCSW 12/18/2016, 3:42 PM

## 2016-12-18 NOTE — Progress Notes (Signed)
Daily Progress Note   Patient Name: Trevor Brock       Date: 12/18/2016 DOB: 1932/11/01  Age: 83 y.o. MRN#: [id number] Attending Physician: Rhetta Mura, MD Primary Care Physician: Benita Stabile, MD Admit Date: 12/17/2016  Reason for Consultation/Follow-up: Establishing goals of care and Psychosocial/spiritual support  Subjective: Family meeting of Trevor Brock at [facility] hospital. Trevor Brock family including wife of 67 years Trevor Brock, daughter/HC POA/durable POA Trevor Brock, sons Trevor Brock, Trevor Brock, and Trevor Brock are present.  We talk about Trevor Brock's chronic health concerns. Family shares that he had been working up until a few years ago. They feel that his decline started April 2017 with a bad fall at home. He was placed in [facility] as a resident Nov 13, 2016. Trevor Brock shares that her worry is that her husband will continue to fall.   We talk about the chronic illness pathway, I share a diagram. We also talk about the differences for people who have dementia. We talk about the dementia pathway in detail including physical changes, nutritional changes, and mental status changes. Family states that Trevor Brock, at this time is unable to complete even simple tasks, and he is mostly nonverbal. They share that he was accepting services from hospice of [facility] in the home until he was placed in [facility]. They share that their Brock is for him to return to hospice services at [facility] center.  Family shares their concern that Trevor Brock is not receiving adequate pain control. They share that [facility] was not giving medications as needed. We talk about the signs and symptoms of discomfort since Trevor Brock is nonverbal. Family is requesting assistance with pain  management both in the hospital and at [facility]. I encourage family to work closely with staff at [facility] center upon return including Trevor Brock, Trevor Brock, Trevor Brock. We talk briefly about Medicare/Medicaid, and billing.  We review the MOST form in detail, but do not complete this form today. We talk about the concept of allowing natural death, let nature take its course, do not treat the next infection or illness. I give the booklet, "Hard choices for loving people". Family states that Trevor Brock never wanted to live in a nursing home, even stating I Brock I die on the way".  Length of Stay: 1  Current Medications: Scheduled Meds:  . erythromycin   Right Eye Q6H  . levothyroxine  112 mcg Oral QAC breakfast  . sertraline  50 mg Oral Daily    Continuous Infusions: . acyclovir Stopped (12/18/16 0926)  . piperacillin-tazobactam (ZOSYN)  IV 3.375 g (12/18/16 1059)  . vancomycin      PRN Meds: acetaminophen **OR** acetaminophen, morphine injection, morphine injection, ondansetron **OR** ondansetron (ZOFRAN) IV  Physical Exam  Nursing note and vitals reviewed.           Vital Signs: BP (!) 135/47 (BP Location: Left Arm)   Pulse 72   Temp 98 F (36.7 C) (Oral)   Resp 18   Ht 6' (1.829 m)   Wt 85.7 kg (188 lb 15 oz)   SpO2 98%   BMI 25.62 kg/m  SpO2: SpO2: 98 % O2 Device: O2 Device: Not Delivered O2 Flow Rate:    Intake/output summary:  Intake/Output Summary (Last 24 hours) at 12/18/16 1301 Last data filed at 12/18/16 0900  Gross per 24 hour  Intake             3620 ml  Output              550 ml  Net             3070 ml   LBM:   Baseline Weight: Weight: 83.9 kg (185 lb) Most recent weight: Weight: 85.7 kg (188 lb 15 oz)       Palliative Assessment/Data:    Flowsheet Rows     Most Recent Value  Intake Tab  Referral Department  -- [ED]  Unit at Time of Referral  ER  Palliative Care Primary Diagnosis  Sepsis/Infectious Disease  Date  Notified  12/17/16  Palliative Care Type  New Palliative care  Reason for referral  Clarify Goals of Care  Date of Admission  12/17/16  Date first seen by Palliative Care  12/17/16  # of days Palliative referral response time  0 Day(s)  # of days IP prior to Palliative referral  0  Clinical Assessment  Palliative Performance Scale Score  20%  Pain Max last 24 hours  Not able to report  Pain Min Last 24 hours  Not able to report  Dyspnea Max Last 24 Hours  Not able to report  Dyspnea Min Last 24 hours  Not able to report  Psychosocial & Spiritual Assessment  Palliative Care Outcomes  Patient/Family meeting held?  Yes  Who was at the meeting?  Son Trevor JohnBrian at bedside      Patient Active Problem List   Diagnosis Date Noted  . Herpes zoster ophthalmicus of right eye 12/17/2016  . Hyponatremia 12/17/2016  . 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 Shingles   . Palliative care encounter   . Goals of care, counseling/discussion   . Generalized weakness 11/11/2016  . Heart murmur 11/11/2016  . Facial cellulitis 11/10/2016  . MRSA carrier 10/22/2015  . Cellulitis and abscess 10/17/2015  . Thrombocytopenia (HCC) 10/17/2015  . Depression 10/17/2015  . Sepsis, unspecified organism (HCC) 10/17/2015  . Malnutrition of moderate degree 10/17/2015  . Anisocoria 10/16/2015  . Pancytopenia (HCC) 10/07/2015  . Syncope 09/28/2015  . Dementia 09/28/2015  . Assistance needed for mobility   . Hyperlipidemia   . Hypothyroid     Palliative Care Assessment & Plan   Patient Profile: 83 y.o. male  with past medical history of End-stage dementia, nonverbal  mostly, SNF resident times 2 months, hypothyroidism, increased cholesterol, history of left-sided facial cellulitis 1 month ago, history of fall with wrist fracture in skin take your 3 weeks ago admitted on 12/17/2016 with right facial shingles with ophthalmic involvement.   Assessment: Advanced dementia; Trevor Brock is  now a resident of [facility] [place].  Recommendations/Plan:  Return to [facility] as resident with the benefits of [facility] if possible.   Goals of Care and Additional Recommendations:  Limitations on Scope of Treatment: Treat the treatable but no CPR or intubation, no PEG tube  Code Status:    Code Status Orders        Start     Ordered   12/17/16 1920  Do not attempt resuscitation (DNR)  Continuous    Question Answer Comment  In the event of cardiac or respiratory ARREST Do not call a "code blue"   In the event of cardiac or respiratory ARREST Do not perform Intubation, CPR, defibrillation or ACLS   In the event of cardiac or respiratory ARREST Use medication by any route, position, wound care, and other measures to relive pain and suffering. May use oxygen, suction and manual treatment of airway obstruction as needed for comfort.      12/17/16 1919    Code Status History    Date Active Date Inactive Code Status Order ID Comments User Context   11/11/2016  1:46 AM 11/13/2016  5:37 PM DNR [id number]  Bobette Mo, MD Inpatient   11/11/2016 12:57 AM 11/11/2016  1:46 AM Full Code [id number]  Bobette Mo, MD Inpatient   10/17/2015 12:27 AM 10/20/2015  9:46 PM Full Code [id number]  Briscoe Deutscher, MD ED   09/28/2015 11:05 PM 10/03/2015  5:37 PM Full Code [id number]  Levie Heritage, DO Inpatient    Advance Directive Documentation     Most Recent Value  Type of Advance Directive  Out of facility DNR (pink MOST or yellow form)  Pre-existing out of facility DNR order (yellow form or pink MOST form)  -  "MOST" Form in Place?  -       Prognosis:   < 6 months would not be surprising based on functional decline, end-stage dementia.  Discharge Planning:  Return to [facility] [place] as a resident, hopefully with the benefits of hospice of [place].  Care plan was discussed with Dr. Mahala Menghini.   Thank you for allowing the Palliative Medicine  Team to assist in the care of this patient.   Time In: 1300 Time Out: 1435 Total Time 95 minutes Prolonged Time Billed  yes       Greater than 50%  of this time was spent counseling and coordinating care related to the above assessment and plan.  Katheran Awe, NP  Please contact Palliative Medicine Team phone at [phone] for questions and concerns.

## 2016-12-18 NOTE — [place] FL2 (Signed)
[place] MEDICAID FL2 LEVEL OF CARE SCREENING TOOL     IDENTIFICATION  Patient Name: Trevor Brock Birthdate: [birth year] Sex: male Admission Date (Current Location): 12/17/2016  [place]:  [place] [id number] P Facility and Address:  [facility], [address], [place], [place] [address]      Provider Number: [id number]  Attending Physician Name and Address:  Rhetta MuraSamtani, Jai-Gurmukh, MD  Relative Name and Phone Number:  Charna BusmanShirley Maharaj - [phone]; Harrold DonathKathy Lawrence - daughter; [phone] [facility](HC and Durable POA)    Current Level of Care: Hospital Recommended Level of Care: Skilled Nursing Facility [facility] Prior Approval Number:    Date Approved/Denied:   PASRR Number: [id number] A Dolores Hoose(Eff. 10/01/15)  Discharge Plan: SNF [facility]    Current Diagnoses: Patient Active Problem List   Diagnosis Date Noted  . Herpes zoster ophthalmicus of right eye 12/17/2016  . Hyponatremia 12/17/2016  . 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 Shingles   . Palliative care encounter   . Goals of care, counseling/discussion   . Generalized weakness 11/11/2016  . Heart murmur 11/11/2016  . Facial cellulitis 11/10/2016  . MRSA carrier 10/22/2015  . Cellulitis and abscess 10/17/2015  . Thrombocytopenia (HCC) 10/17/2015  . Depression 10/17/2015  . Sepsis, unspecified organism (HCC) 10/17/2015  . Malnutrition of moderate degree 10/17/2015  . Anisocoria 10/16/2015  . Pancytopenia (HCC) 10/07/2015  . Syncope 09/28/2015  . Dementia 09/28/2015  . Assistance needed for mobility   . Hyperlipidemia   . Hypothyroid     Orientation RESPIRATION BLADDER Height & Weight     Self  Normal Incontinent Weight: 188 lb 15 oz (85.7 kg) Height:  6' (182.9 cm)  BEHAVIORAL SYMPTOMS/MOOD NEUROLOGICAL BOWEL NUTRITION STATUS      Continent Diet (Regular)  AMBULATORY STATUS COMMUNICATION OF NEEDS Skin   Limited  Assist Verbally Other (Comment), Skin abrasions (Abrasion-right anterior face; Blister-right anterior face; Cellulitis-right anterior face; Ecchymosis-right/left arm; Skin tear - right/left arm, anterior & posterior; weeping of right anterior facec)                       Personal Care Assistance Level of Assistance  Bathing, Feeding, Dressing Bathing Assistance: Limited assistance Feeding assistance: Limited assistance Dressing Assistance: Limited assistance     Functional Limitations Info  Sight, Hearing, Speech Sight Info: Impaired Hearing Info: Adequate Speech Info: Adequate (patient talks at times, but mostly nonsensically)    SPECIAL CARE FACTORS FREQUENCY                       Contractures Contractures Info: Not present    Additional Factors Info  Code Status, Allergies Code Status Info: DNR Allergies Info: Statins           Current Medications (12/18/2016):  This is the current hospital active medication list Current Facility-Administered Medications  Medication Dose Route Frequency Provider Last Rate Last Dose  . acetaminophen (TYLENOL) tablet 650 mg  650 mg Oral Q6H PRN Tat, David, MD       Or  . acetaminophen (TYLENOL) suppository 650 mg  650 mg Rectal Q6H PRN Tat, Onalee Huaavid, MD      . acyclovir (ZOVIRAX) 840 mg in dextrose 5 % 150 mL IVPB  10 mg/kg Intravenous Andres LabrumQ8H Tat, David, MD   Stopped at 12/18/16 [id number]  . erythromycin ophthalmic ointment   Right Eye Rob BuntingQ6H Tat, David, MD      .  levothyroxine (SYNTHROID, LEVOTHROID) tablet 112 mcg  112 mcg Oral QAC breakfast Tat, Onalee Hua, MD   112 mcg at 12/18/16 0825  . morphine 2 MG/ML injection 1 mg  1 mg Intravenous Q3H PRN Rhetta Mura, MD      . ondansetron (ZOFRAN) tablet 4 mg  4 mg Oral Q6H PRN Tat, David, MD       Or  . ondansetron (ZOFRAN) injection 4 mg  4 mg Intravenous Q6H PRN Tat, David, MD      . piperacillin-tazobactam (ZOSYN) IVPB 3.375 g  3.375 g Intravenous Q8H Samtani, Jai-Gurmukh, MD 12.5 mL/hr  at 12/18/16 1059 3.375 g at 12/18/16 1059  . sertraline (ZOLOFT) tablet 50 mg  50 mg Oral Daily Tat, David, MD   50 mg at 12/18/16 1059  . vancomycin (VANCOCIN) 1,500 mg in sodium chloride 0.9 % 500 mL IVPB  1,500 mg Intravenous Q24H Catarina Hartshorn, MD         Discharge Medications: Please see discharge summary for a list of discharge medications.  Relevant Imaging Results:  Relevant Lab Results:   Additional Information ss#[id number].  Cristobal Goldmann, LCSW

## 2016-12-18 NOTE — Consult Note (Signed)
Ophthalmology follow up  83 yo pt with herpes zoster following V2 distribution.  The pt appears stable.  The right lower eyelid is involved in the V2 distribution.  Pt does have edema of the right upper eyelid but swelling is from surrounding affected area and no vesicles seen.  The conjunctiva has 1 small area of subconjunctival hemorrhage and mild injection from dryness and ointment but no involvement from zoster and no staining with flourescein. The cornea is not involved and is clear.  Crisp iris details seen and no iritis appreciated.  Pt is not verbal so exam is limited. Pt is on acyclovir, IV antibiotics, and Erythromycin ointment 6x a day in the right eye.     A/P 1.Herpes Zoster stable with yesterdays exam.  The vesicles are continuing to crust over but no other dermatomes appear to be involved.  CPM with Erythromycin ointment 6x a day.  If right eye becomes more injected please feel free to give us a call and we will reexamine to see if zoster is progressing.  No progression on exam today and symptoms started on the 18th.  It is unlikely for V2 zoster to involve the eye but it can occur.  I will recheck on Monday if patient still in hospital or sooner if any changes seen on exam.  Thank you for allowing me to participate in the care of this patient.  Please feel free to contact me if you have any concerns.  Mia Creekimothy Caitland Porchia, M.D. Cell [phone] Office [phone]

## 2016-12-19 LAB — BASIC METABOLIC PANEL
ANION GAP: 4 — AB (ref 5–15)
BUN: 10 mg/dL (ref 6–20)
CALCIUM: 7.9 mg/dL — AB (ref 8.9–10.3)
CO2: 24 mmol/L (ref 22–32)
Chloride: 102 mmol/L (ref 101–111)
Creatinine, Ser: 1.05 mg/dL (ref 0.61–1.24)
Glucose, Bld: 89 mg/dL (ref 65–99)
POTASSIUM: 3.6 mmol/L (ref 3.5–5.1)
SODIUM: 130 mmol/L — AB (ref 135–145)

## 2016-12-19 LAB — CBC WITH DIFFERENTIAL/PLATELET
BASOS ABS: 0 10*3/uL (ref 0.0–0.1)
BASOS PCT: 1 %
Eosinophils Absolute: 0.2 10*3/uL (ref 0.0–0.7)
Eosinophils Relative: 4 %
HEMATOCRIT: 31.5 % — AB (ref 39.0–52.0)
HEMOGLOBIN: 10.6 g/dL — AB (ref 13.0–17.0)
Lymphocytes Relative: 47 %
Lymphs Abs: 2.3 10*3/uL (ref 0.7–4.0)
MCH: 33.1 pg (ref 26.0–34.0)
MCHC: 33.7 g/dL (ref 30.0–36.0)
MCV: 98.4 fL (ref 78.0–100.0)
MONO ABS: 0.3 10*3/uL (ref 0.1–1.0)
Monocytes Relative: 7 %
NEUTROS ABS: 2 10*3/uL (ref 1.7–7.7)
NEUTROS PCT: 41 %
Platelets: 59 10*3/uL — ABNORMAL LOW (ref 150–400)
RBC: 3.2 MIL/uL — AB (ref 4.22–5.81)
RDW: 14.6 % (ref 11.5–15.5)
WBC: 4.8 10*3/uL (ref 4.0–10.5)

## 2016-12-19 MED ORDER — DOXYCYCLINE HYCLATE 100 MG PO TABS
100.0000 mg | ORAL_TABLET | Freq: Two times a day (BID) | ORAL | Status: DC
  Administered 2016-12-19 – 2016-12-20 (×2): 100 mg via ORAL
  Filled 2016-12-19 (×2): qty 1

## 2016-12-19 MED ORDER — ACYCLOVIR 800 MG PO TABS
800.0000 mg | ORAL_TABLET | Freq: Three times a day (TID) | ORAL | Status: DC
  Administered 2016-12-19 – 2016-12-20 (×2): 800 mg via ORAL
  Filled 2016-12-19 (×5): qty 1

## 2016-12-19 MED ORDER — MORPHINE SULFATE (CONCENTRATE) 10 MG/0.5ML PO SOLN
5.0000 mg | Freq: Four times a day (QID) | ORAL | Status: DC | PRN
  Administered 2016-12-19: 5 mg via ORAL
  Filled 2016-12-19: qty 0.5

## 2016-12-19 NOTE — Clinical Social Work Note (Signed)
CSW received call from MD questioning [facility] with [facility]les. CSW [facility] with [facility] that pt can return to facility tomorrow with shingles, as long as it's being treated. CSW updated MD. CSW continuing to follow for discharge needs.   Corlis HoveJeneya Anayely Constantine, LCSWA, LCASA Clinical Social Work [place]) [phone]

## 2016-12-19 NOTE — Progress Notes (Signed)
PROGRESS NOTE    LEMARCUS Brock  ZOX:[id number] DOB: 02-09-1933 DOA: 12/17/2016 PCP: Benita Stabile, MD  Outpatient Specialists:     Brief Narrative:  83 y/o ? Moderate to severe dementia baseline Depression Chronic thrombocytopenia, platelet range in the 50s Moderate to severe malnutrition 6 emergency visits to [facility] ED in June 2018 with falls  Admission 5/15-5/18 left-sided facial cellulitis, low-grade fever-DC To SNF Augmentin  Prior admission 09/2015 left buttock abscess status post Rx's doxycycline   Admitted initially at [facility] 6/19 and transferred to [facility] secondary to facial cellulitis, likely herpes zoster involvement of trigeminal nerve Patient was first seen in the emergency room 6/19 started doxycycline He progressed quickly and was sent over from the [facility] with pathogen on the  rash&blisters  Ophthalmology saw the patient in consult recommended treatment as below   Assessment & Plan:   Active Problems:   Dementia   Pancytopenia (HCC)   Facial cellulitis   Herpes zoster ophthalmicus of right eye   Hyponatremia   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 Vonna Kotyk involvement, d/w Dr. Vonna Kotyk 6/24 can d/c home from his  perspective--Will need OP optho follow up in [place] in 1 wk  Keep on airborne precautions until crusting over of lesion heals--Can go back to SNF 6/24  Moderate to severe dementia Complications of behavioral disturbances and dementia  No family present  Sertraline 50 daily   Palliative care discussion performed 6/22 at [facility] reveals possibly would be a good candidate for hospice of [facility] once stabilizes from acute treatment  He is DO NOT RESUSCITATE   Hypovolemic hyponatremia 2/2 to sepsis  No urine studies however patient septic on admission  Follow trend of labs, saline 50 cc per hour d/c 6/23  Hypothyroidism  Continue Synthroid 112 g a.m.     Anticoagulation is relatively contraindicated with thrombocytopenia No family present at the bedside Inpatient pending resolution Disposition likely back to [facility] with hospice in 3-4 days  Consultants:   Ophthalmology  Procedures:     Antimicrobials:   Vancomycin, Zosyn, acyclovir 6/21-->doxy + acylovir 6/23  erythromycin 6/21---> 10 days more   Subjective:  Nonverbal More alert however Family in room  Objective: Vitals:   12/18/16 2102 12/19/16 0600 12/19/16 0945 12/19/16 1707  BP: (!) 123/49 (!) 116/51 (!) 132/52 (!) 110/52  Pulse: 79 68 64 74  Resp: 18 16 18 18   Temp: 100.2 F (37.9 C) 99.1 F (37.3 C) 98.8 F (37.1 C) 99.7 F (37.6 C)  TempSrc: Oral Oral Oral Oral  SpO2: 97% 98% 96% 99%  Weight: 88 kg (194 lb)     Height:        Intake/Output Summary (Last 24 hours) at 12/19/16 1732 Last data filed at 12/19/16 1300  Gross per 24 hour  Intake           1227.2 ml  Output             1800 ml  Net           -572.8 ml   Filed Weights   12/17/16 1141 12/17/16 2155 12/18/16 2102  Weight: 83.9 kg (185 lb)  85.7 kg (188 lb 15 oz) 88 kg (194 lb)    Examination:  Confluent area below right lower eyelid has crusting and scab, it does not lose however Right eye has a brisk pupillary reflex, conjunctiva slightly pink, hyphema on temporal area of outside of iris Neck is soft supple S1-S2 no murmur rub or gallop Abdomen is soft nontender nondistended no rebound no guarding Neuro hard to determine   Data Reviewed: I have personally reviewed following labs and imaging studies  CBC:  Recent Labs Lab 12/15/16 2130 12/17/16 1247 12/18/16 0651 12/19/16 0407  WBC 3.0*  3.2* 4.0 4.8  NEUTROABS 1.7 1.5*  --  2.0  HGB 11.8* 12.4* 10.7* 10.6*  HCT 35.2* 36.8* 32.3* 31.5*  MCV 99.7 98.7 99.4 98.4  PLT 47* 45* 54* 59*   Basic Metabolic Panel:  Recent Labs Lab 12/15/16 2130 12/17/16 1247 12/18/16 0651 12/19/16 0407  NA 132* 133* 131* 130*  K 4.1 4.1 4.1 3.6  CL 103 103 104 102  CO2 21* 23 21* 24  GLUCOSE 105* 101* 96 89  BUN 14 13 8 10   CREATININE 0.98 0.89 0.90 1.05  CALCIUM 8.2* 8.5* 7.9* 7.9*   GFR: Estimated Creatinine Clearance: 60.6 mL/min (by C-G formula based on SCr of 1.05 mg/dL). Liver Function Tests:  Recent Labs Lab 12/15/16 2130 12/17/16 1247  AST 40 39  ALT 24 24  ALKPHOS 136* 148*  BILITOT 0.9 1.0  PROT 5.8* 6.1*  ALBUMIN 3.1* 3.1*   No results for input(s): LIPASE, AMYLASE in the last 168 hours. No results for input(s): AMMONIA in the last 168 hours. Coagulation Profile:  Recent Labs Lab 12/15/16 2130  INR 1.11   Cardiac Enzymes: No results for input(s): CKTOTAL, CKMB, CKMBINDEX, TROPONINI in the last 168 hours. BNP (last 3 results) No results for input(s): PROBNP in the last 8760 hours. HbA1C: No results for input(s): HGBA1C in the last 72 hours. CBG: No results for input(s): GLUCAP in the last 168 hours. Lipid Profile: No results for input(s): CHOL, HDL, LDLCALC, TRIG, CHOLHDL, LDLDIRECT in the last 72 hours. Thyroid Function Tests: No results for input(s): TSH, T4TOTAL, FREET4, T3FREE, THYROIDAB in the last 72 hours. Anemia Panel:  Recent Labs  12/18/16 0651  VITAMINB12 435   Urine analysis:    Component Value Date/Time   COLORURINE YELLOW 12/15/2016 2112   APPEARANCEUR CLEAR 12/15/2016 2112   LABSPEC 1.020 12/15/2016 2112   PHURINE 6.0 12/15/2016 2112   GLUCOSEU NEGATIVE 12/15/2016 2112   HGBUR SMALL (A) 12/15/2016 2112   BILIRUBINUR NEGATIVE 12/15/2016 2112   KETONESUR NEGATIVE 12/15/2016 2112   PROTEINUR NEGATIVE 12/15/2016 2112   NITRITE NEGATIVE 12/15/2016 2112   LEUKOCYTESUR NEGATIVE  12/15/2016 2112   Sepsis Labs: @LABRCNTIP (procalcitonin:4,lacticidven:4)  ) Recent Results (from the past 240 hour(s))  Culture, blood (Routine x 2)     Status: None (Preliminary result)   Collection Time: 12/15/16  9:30 PM  Result Value Ref Range Status   Specimen Description BLOOD RIGHT HAND  Final   Special Requests   Final    BOTTLES DRAWN AEROBIC AND ANAEROBIC Blood Culture adequate volume   Culture NO GROWTH 3 DAYS  Final   Report Status PENDING  Incomplete  Culture, blood (Routine x 2)     Status: Abnormal   Collection Time: 12/15/16  9:30 PM  Result Value Ref Range Status   Specimen Description BLOOD LEFT HAND  Final   Special Requests   Final    BOTTLES DRAWN AEROBIC AND ANAEROBIC  Blood Culture adequate volume   Culture  Setup Time   Final    GRAM POSITIVE COCCI recovered from anarobic bottle Gram Stain Report Called to,Read Back By and Verified With: Cruise,[email or web address]  by matthews,b 6.20.18 [facility] IN BOTH AEROBIC AND ANAEROBIC BOTTLES    Culture (A)  Final    STAPHYLOCOCCUS SPECIES (COAGULASE NEGATIVE) THE SIGNIFICANCE OF ISOLATING THIS ORGANISM FROM A SINGLE SET OF BLOOD CULTURES WHEN MULTIPLE SETS ARE DRAWN IS UNCERTAIN. PLEASE NOTIFY THE MICROBIOLOGY DEPARTMENT WITHIN ONE WEEK IF SPECIATION AND SENSITIVITIES ARE REQUIRED. Performed at [facility], [address] [place], [place] [address]    Report Status 12/18/2016 FINAL  Final  Blood Culture ID Panel (Reflexed)     Status: Abnormal   Collection Time: 12/15/16  9:30 PM  Result Value Ref Range Status   Enterococcus species NOT DETECTED NOT DETECTED Final   Listeria monocytogenes NOT DETECTED NOT DETECTED Final   Staphylococcus species DETECTED (A) NOT DETECTED Final    Comment: Methicillin (oxacillin) resistant coagulase negative staphylococcus. Possible blood culture contaminant (unless isolated from more than one blood culture draw or clinical case suggests pathogenicity). No antibiotic treatment  is indicated for blood  culture contaminants. CRITICAL RESULT CALLED TO, READ BACK BY AND VERIFIED WITH: K.Emeline GinsLANE,RN [id number] 12/17/16 M.CAMPBELL    Staphylococcus aureus NOT DETECTED NOT DETECTED Final   Methicillin resistance DETECTED (A) NOT DETECTED Final    Comment: CRITICAL RESULT CALLED TO, READ BACK BY AND VERIFIED WITH: K.Emeline GinsLANE,RN [id number] 12/17/16 M.CAMPBELL    Streptococcus species NOT DETECTED NOT DETECTED Final   Streptococcus agalactiae NOT DETECTED NOT DETECTED Final   Streptococcus pneumoniae NOT DETECTED NOT DETECTED Final   Streptococcus pyogenes NOT DETECTED NOT DETECTED Final   Acinetobacter baumannii NOT DETECTED NOT DETECTED Final   Enterobacteriaceae species NOT DETECTED NOT DETECTED Final   Enterobacter cloacae complex NOT DETECTED NOT DETECTED Final   Escherichia coli NOT DETECTED NOT DETECTED Final   Klebsiella oxytoca NOT DETECTED NOT DETECTED Final   Klebsiella pneumoniae NOT DETECTED NOT DETECTED Final   Proteus species NOT DETECTED NOT DETECTED Final   Serratia marcescens NOT DETECTED NOT DETECTED Final   Haemophilus influenzae NOT DETECTED NOT DETECTED Final   Neisseria meningitidis NOT DETECTED NOT DETECTED Final   Pseudomonas aeruginosa NOT DETECTED NOT DETECTED Final   Candida albicans NOT DETECTED NOT DETECTED Final   Candida glabrata NOT DETECTED NOT DETECTED Final   Candida krusei NOT DETECTED NOT DETECTED Final   Candida parapsilosis NOT DETECTED NOT DETECTED Final   Candida tropicalis NOT DETECTED NOT DETECTED Final    Comment: Performed at [facility], [address] [place], [place] [address]  Blood Culture (routine x 2)     Status: None (Preliminary result)   Collection Time: 12/17/16 12:48 PM  Result Value Ref Range Status   Specimen Description BLOOD RIGHT ARM  Final   Special Requests   Final    BOTTLES DRAWN AEROBIC AND ANAEROBIC Blood Culture adequate volume   Culture NO GROWTH < 24 HOURS  Final   Report Status PENDING  Incomplete    Blood Culture (routine x 2)     Status: None (Preliminary result)   Collection Time: 12/17/16 12:53 PM  Result Value Ref Range Status   Specimen Description BLOOD LEFT ARM  Final   Special Requests   Final    BOTTLES DRAWN AEROBIC AND ANAEROBIC Blood Culture adequate volume   Culture NO GROWTH < 24 HOURS  Final   Report  Status PENDING  Incomplete  MRSA PCR Screening     Status: Abnormal   Collection Time: 12/17/16  7:22 PM  Result Value Ref Range Status   MRSA by PCR POSITIVE (A) NEGATIVE Final    Comment:        The GeneXpert MRSA Assay (FDA approved for NASAL specimens only), is one component of a comprehensive MRSA colonization surveillance program. It is not intended to diagnose MRSA infection nor to guide or monitor treatment for MRSA infections. RESULT CALLED TO, READ BACK BY AND VERIFIED WITH: C.MARSHALL,RN AT 0314 BY L.PITT 12/18/16          Radiology Studies: No results found.      Scheduled Meds: . acyclovir  800 mg Oral TID  . doxycycline  100 mg Oral Q12H  . erythromycin   Right Eye Q6H  . levothyroxine  112 mcg Oral QAC breakfast  . sertraline  50 mg Oral Daily   Continuous Infusions: . sodium chloride 50 mL/hr at 12/19/16 1211     LOS: 2 days    Time spent: 74    Pleas Koch, MD [facility] (P) [phone]   If 7PM-7AM, please contact night-coverage [email or web address] Password [facility] 12/19/2016, 5:32 PM

## 2016-12-20 ENCOUNTER — Telehealth: Payer: Self-pay

## 2016-12-20 LAB — CULTURE, BLOOD (ROUTINE X 2)
Culture: NO GROWTH
Special Requests: ADEQUATE

## 2016-12-20 MED ORDER — ACYCLOVIR 800 MG PO TABS: 800.0000 mg | ORAL_TABLET | Freq: Three times a day (TID) | ORAL | 0 refills | Status: AC

## 2016-12-20 MED ORDER — SERTRALINE HCL 50 MG PO TABS: 50.0000 mg | ORAL_TABLET | Freq: Every day | ORAL | 0 refills | Status: AC

## 2016-12-20 MED ORDER — DOXYCYCLINE HYCLATE 100 MG PO TABS: 100.0000 mg | ORAL_TABLET | Freq: Two times a day (BID) | ORAL | 0 refills | Status: AC

## 2016-12-20 MED ORDER — MORPHINE SULFATE (CONCENTRATE) 10 MG/0.5ML PO SOLN: 5.0000 mg | Freq: Four times a day (QID) | ORAL | 0 refills | Status: AC | PRN

## 2016-12-20 MED ORDER — ERYTHROMYCIN 5 MG/GM OP OINT: TOPICAL_OINTMENT | Freq: Four times a day (QID) | OPHTHALMIC | 0 refills | Status: AC

## 2016-12-20 NOTE — Discharge Summary (Signed)
Physician Discharge Summary  Trevor Brock:[id number] DOB: 1933/02/09 DOA: 12/17/2016  PCP: Benita Stabile, MD  Admit date: 81/21/2018 Discharge date: 12/20/2016  Time spent: 35 minutes  Recommendations for Outpatient Follow-up:  1. Please complete entire 7 day course of acyclovir and doxycycline 2. Patient should he seen as per arrangement by the [facility] with either an optometrist or ophthalmologist within one week from day of discharge 3. Would recommend hospice following the patient at the [facility] 4. 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 Moderate to severe dementia baseline Depression Chronic thrombocytopenia, platelet range in the 50s Moderate to severe malnutrition 6 emergency visits to [facility] ED in June 2018 with falls             Admission 5/15-5/18 left-sided facial cellulitis, low-grade fever-DC To SNF Augmentin             Prior admission 09/2015 left buttock abscess status post Rx's doxycycline   Admitted initially at [facility] 6/19 and transferred to [facility] secondary to facial cellulitis, likely herpes zoster involvement of trigeminal nerve Patient was first seen in the emergency room 6/19 started doxycycline He progressed quickly and was sent over from the [facility] with pathogen on the  rash&blisters  Ophthalmology saw the patient in consult recommended treatment as below              Hospital Course:   Facial cellulitis confirmed on CT scan 6/19 Herpes zoster ophthalmicus right eye, V2 involvement trigeminal nerve             Continue erythromycin ointment 4 time daily right eye             IV vancomycin, acyclovir, Zosyn per pharmacy-->Doxy 100 bid/Acyclovir orally 800     TID 6/23             Continue saline eyedrops             Appreciate Dr. Vonna Kotyk involvement, d/w Dr. Vonna Kotyk 6/24 can d/c home from his perspective--Will need OP optho follow up in [place] in 1 wk              date of completion of acyclovir 6/28, doxycycline 6/28 (this is a 7 day course)  Moderate to severe dementia Complications of behavioral disturbances and dementia             Sertraline 50 daily prescribed              Palliative care discussion performed 6/22 at [facility] reveals possibly would be a good candidate for hospice of [facility] once stabilizes from acute treatment             He is DO NOT RESUSCITATE            Hypovolemic hyponatremia 2/2 to sepsis  No urine studies however patient septic on admission              repeat labs as per interval nursing home protocol  Hypothyroidism             Continue Synthroid 112 g a.m.  Consider TSH if intent of care is not comfort   Consultations: Dr. Mia Creek (ophthalmology)   Discharge Exam: Vitals:   12/20/16 0649 12/20/16 0850  BP: (!) 107/39 (!) 82/68  Pulse: 68 71  Resp: 18 18  Temp: 98.4 F (36.9 C) 98.5 F (36.9 C)    General: Alert but confused, picking at the face, some losing however patient's right eye does not appear to be injected did and subconjunctival hemorrhage seems less his overall right eyelid is almost equal in size and less swollen compared to prior exams His face in the distribution is scabbed over but I do not notice any further cellulitis surrounding the scalp Cardiovascular: S1-S2 no murmur rub or gallop Respiratory: Clinically clear Abdomen soft  nontender  Discharge Instructions   Discharge Instructions    Diet - low sodium heart healthy    Complete by:  As directed    Increase activity slowly    Complete by:  As directed      Current Discharge Medication List    START taking these medications   Details  acyclovir (ZOVIRAX) 800 MG tablet Take 1 tablet (800 mg total) by mouth 3 (three) times daily. Qty: 12 tablet, Refills: 0    doxycycline (VIBRA-TABS) 100 MG tablet Take 1 tablet (100 mg total) by mouth every 12 (twelve) hours. Qty: 8 tablet, Refills: 0    erythromycin ophthalmic ointment Place into the right eye every 6 (six) hours. Qty: 3.5 g, Refills: 0    Morphine Sulfate (MORPHINE CONCENTRATE) 10 MG/0.5ML SOLN concentrated solution Take 0.25 mLs (5 mg total) by mouth every 6 (six) hours as needed for moderate pain or severe pain. Qty: 30 mL, Refills: 0      CONTINUE these medications which have CHANGED   Details  sertraline (ZOLOFT) 50 MG tablet Take 1 tablet (50 mg total) by mouth daily. Qty: 3 tablet, Refills: 0      CONTINUE these medications which have NOT CHANGED   Details  acetaminophen (TYLENOL) 325 MG tablet Take 2 tablets (650 mg total) by mouth every 6 (six) hours as needed for moderate pain or fever. Qty: 60 tablet, Refills: 0    levothyroxine (SYNTHROID, LEVOTHROID) 112 MCG tablet Take one tablet by mouth 30 minutes before breakfast once daily for thyroid Refills: 0      STOP taking these medications     doxycycline (VIBRAMYCIN) 100 MG capsule      HYDROcodone-acetaminophen (NORCO) 5-325 MG tablet        Allergies  Allergen Reactions  . Statins     Possible cause of weakness      The results of significant diagnostics from this hospitalization (including imaging, microbiology, ancillary and laboratory) are listed below for reference.    Significant Diagnostic Studies: Dg Chest 2 View  Result Date: 12/15/2016 CLINICAL DATA:  Fever, cellulitis. EXAM: CHEST  2 VIEW COMPARISON:   Chest radiograph Nov 10, 2016 FINDINGS: Cardiac silhouette is mildly enlarged and unchanged. Tortuous mildly calcified aorta. Low inspiratory examination. Mild bronchitic changes and LEFT lung base strandy densities. No pneumothorax. Osteopenia. IMPRESSION: Mild cardiomegaly and mild bronchitic changes with LEFT lung base atelectasis/scarring. Electronically Signed   By: Awilda Metro M.D.   On:  12/15/2016 22:29   Dg Wrist Complete Right  Result Date: 12/03/2016 CLINICAL DATA:  Right wrist pain after fall from bed. Wrist swelling. EXAM: RIGHT WRIST - COMPLETE 3+ VIEW COMPARISON:  None. FINDINGS: Suspected triquetral fracture about the dorsal aspect of the proximal carpal row in, appreciated on the lateral view. No evidence of additional fracture. Mild radiocarpal osteoarthritis. Degenerative change at the base of the thumb. There is mild soft tissue edema. IMPRESSION: Suspect acute triquetral fracture with soft tissue edema. Electronically Signed   By: Rubye Oaks M.D.   On: 12/03/2016 23:20   Ct Head Wo Contrast  Result Date: 12/03/2016 CLINICAL DATA:  Dementia patient post fall from bed with head injury. Laceration and hematoma. EXAM: CT HEAD WITHOUT CONTRAST CT CERVICAL SPINE WITHOUT CONTRAST TECHNIQUE: Multidetector CT imaging of the head and cervical spine was performed following the standard protocol without intravenous contrast. Multiplanar CT image reconstructions of the cervical spine were also generated. COMPARISON:  Head CT 11/10/2016 FINDINGS: CT HEAD FINDINGS Brain: Stable atrophy and chronic small vessel ischemia. Unchanged remote small right cerebellar infarct. No evidence of acute infarction, hemorrhage, hydrocephalus, extra-axial collection or mass lesion/mass effect. Vascular: Atherosclerosis of skullbase vasculature without hyperdense vessel or abnormal calcification. Skull: No skull fracture.  Right frontal scalp hematoma. Sinuses/Orbits: Right periorbital hematoma. No evidence of  facial bone fracture. Status post scleral banding on the right. Mastoid air cells and paranasal sinuses are well-aerated. Other: None. CT CERVICAL SPINE FINDINGS Alignment: Minimal anterolisthesis of C7 on T1 appears degenerative. With the septa normally aligned. Lateral masses of C1 are well aligned on C2. Skull base and vertebrae: No acute fracture. Degenerative change at C1-C2. Mild endplate changes at C5-C6. Vertebral body heights are preserved. Soft tissues and spinal canal: No prevertebral fluid or swelling. No visible canal hematoma. Disc levels: Diffuse disc space narrowing is most prominent at C5-C6 and C6-C7 with endplate spurring. Multilevel facet arthropathy and multilevel neural foraminal narrowing. Upper chest: No acute abnormality. Other: Mild carotid vascular calcifications. IMPRESSION: 1. Right frontal scalp hematoma. No skull fracture or acute intracranial abnormality. Stable degree of atrophy and chronic small vessel ischemia. 2. Degenerative change in the cervical spine without acute fracture or subluxation. Electronically Signed   By: Rubye Oaks M.D.   On: 12/03/2016 23:27   Ct Cervical Spine Wo Contrast  Result Date: 12/03/2016 CLINICAL DATA:  Dementia patient post fall from bed with head injury. Laceration and hematoma. EXAM: CT HEAD WITHOUT CONTRAST CT CERVICAL SPINE WITHOUT CONTRAST TECHNIQUE: Multidetector CT imaging of the head and cervical spine was performed following the standard protocol without intravenous contrast. Multiplanar CT image reconstructions of the cervical spine were also generated. COMPARISON:  Head CT 11/10/2016 FINDINGS: CT HEAD FINDINGS Brain: Stable atrophy and chronic small vessel ischemia. Unchanged remote small right cerebellar infarct. No evidence of acute infarction, hemorrhage, hydrocephalus, extra-axial collection or mass lesion/mass effect. Vascular: Atherosclerosis of skullbase vasculature without hyperdense vessel or abnormal calcification. Skull:  No skull fracture.  Right frontal scalp hematoma. Sinuses/Orbits: Right periorbital hematoma. No evidence of facial bone fracture. Status post scleral banding on the right. Mastoid air cells and paranasal sinuses are well-aerated. Other: None. CT CERVICAL SPINE FINDINGS Alignment: Minimal anterolisthesis of C7 on T1 appears degenerative. With the septa normally aligned. Lateral masses of C1 are well aligned on C2. Skull base and vertebrae: No acute fracture. Degenerative change at C1-C2. Mild endplate changes at C5-C6. Vertebral body heights are preserved. Soft tissues and spinal canal: No prevertebral fluid or swelling. No  visible canal hematoma. Disc levels: Diffuse disc space narrowing is most prominent at C5-C6 and C6-C7 with endplate spurring. Multilevel facet arthropathy and multilevel neural foraminal narrowing. Upper chest: No acute abnormality. Other: Mild carotid vascular calcifications. IMPRESSION: 1. Right frontal scalp hematoma. No skull fracture or acute intracranial abnormality. Stable degree of atrophy and chronic small vessel ischemia. 2. Degenerative change in the cervical spine without acute fracture or subluxation. Electronically Signed   By: Rubye OaksMelanie  Ehinger M.D.   On: 12/03/2016 23:27   Ct Maxillofacial W Contrast  Result Date: 12/16/2016 CLINICAL DATA:  Initial evaluation for recent fall with laceration to right side of face, increased swelling with redness and fever. EXAM: CT MAXILLOFACIAL WITH CONTRAST TECHNIQUE: Multidetector CT imaging of the maxillofacial structures was performed. Multiplanar CT image reconstructions were also generated. A small metallic BB was placed on the right temple in order to reliably differentiate right from left. COMPARISON:  Prior CT from 12/03/2016. FINDINGS: Osseous: No acute osseous abnormality about the face. Leftward nasal septal deviation noted. Orbits: Postoperative changes present at the right globe. Globes and orbital soft tissues otherwise within  normal limits. Sinuses: Mild scattered mucosal thickening within the ethmoidal air cells. Paranasal sinuses are otherwise clear. Trace opacity noted within the right mastoid air cells. Left mastoid air cells clear. Middle ear cavities are clear. Soft tissues: Soft tissue swelling with inflammatory stranding present within the right face, involving the right cheek and right masticator space, concerning for cellulitis given the provided history. Overlying skin thickening. Changes extend inferiorly into the anterior aspect of the partially visualized right upper neck. Mild inflammatory stranding within the right retro antral fat. Right parapharyngeal fat preserved at this time without deeper extension into the neck. No discrete abscess or drainable fluid collection identified. No postseptal extension into the bony right orbit. Few mildly prominent right level 1 B lymph nodes, likely reactive. Limited intracranial: Unremarkable. IMPRESSION: Asymmetric soft tissue swelling with inflammatory stranding within the right face, extending inferiorly along the anterior aspect of the upper right neck, concerning for cellulitis given the provided history. No abscess or drainable fluid collection identified. No postseptal extension into the bony right orbit. Electronically Signed   By: Rise MuBenjamin  McClintock M.D.   On: 12/16/2016 00:08    Microbiology: Recent Results (from the past 240 hour(s))  Culture, blood (Routine x 2)     Status: None (Preliminary result)   Collection Time: 12/15/16  9:30 PM  Result Value Ref Range Status   Specimen Description BLOOD RIGHT HAND  Final   Special Requests   Final    BOTTLES DRAWN AEROBIC AND ANAEROBIC Blood Culture adequate volume   Culture NO GROWTH 4 DAYS  Final   Report Status PENDING  Incomplete  Culture, blood (Routine x 2)     Status: Abnormal   Collection Time: 12/15/16  9:30 PM  Result Value Ref Range Status   Specimen Description BLOOD LEFT HAND  Final   Special  Requests   Final    BOTTLES DRAWN AEROBIC AND ANAEROBIC Blood Culture adequate volume   Culture  Setup Time   Final    GRAM POSITIVE COCCI recovered from anarobic bottle Gram Stain Report Called to,Read Back By and Verified With: Cruise,[email or web address]  by matthews,b 6.20.18 [facility] IN BOTH AEROBIC AND ANAEROBIC BOTTLES    Culture (A)  Final    STAPHYLOCOCCUS SPECIES (COAGULASE NEGATIVE) THE SIGNIFICANCE OF ISOLATING THIS ORGANISM FROM A SINGLE SET OF BLOOD CULTURES WHEN MULTIPLE SETS ARE DRAWN IS UNCERTAIN. PLEASE NOTIFY  THE MICROBIOLOGY DEPARTMENT WITHIN ONE WEEK IF SPECIATION AND SENSITIVITIES ARE REQUIRED. Performed at [facility], [address] [place], [place] [address]    Report Status 12/18/2016 FINAL  Final  Blood Culture ID Panel (Reflexed)     Status: Abnormal   Collection Time: 12/15/16  9:30 PM  Result Value Ref Range Status   Enterococcus species NOT DETECTED NOT DETECTED Final   Listeria monocytogenes NOT DETECTED NOT DETECTED Final   Staphylococcus species DETECTED (A) NOT DETECTED Final    Comment: Methicillin (oxacillin) resistant coagulase negative staphylococcus. Possible blood culture contaminant (unless isolated from more than one blood culture draw or clinical case suggests pathogenicity). No antibiotic treatment is indicated for blood  culture contaminants. CRITICAL RESULT CALLED TO, READ BACK BY AND VERIFIED WITH: K.Emeline Gins [id number] 12/17/16 M.CAMPBELL    Staphylococcus aureus NOT DETECTED NOT DETECTED Final   Methicillin resistance DETECTED (A) NOT DETECTED Final    Comment: CRITICAL RESULT CALLED TO, READ BACK BY AND VERIFIED WITH: K.Emeline Gins [id number] 12/17/16 M.CAMPBELL    Streptococcus species NOT DETECTED NOT DETECTED Final   Streptococcus agalactiae NOT DETECTED NOT DETECTED Final   Streptococcus pneumoniae NOT DETECTED NOT DETECTED Final   Streptococcus pyogenes NOT DETECTED NOT DETECTED Final   Acinetobacter baumannii NOT DETECTED NOT DETECTED  Final   Enterobacteriaceae species NOT DETECTED NOT DETECTED Final   Enterobacter cloacae complex NOT DETECTED NOT DETECTED Final   Escherichia coli NOT DETECTED NOT DETECTED Final   Klebsiella oxytoca NOT DETECTED NOT DETECTED Final   Klebsiella pneumoniae NOT DETECTED NOT DETECTED Final   Proteus species NOT DETECTED NOT DETECTED Final   Serratia marcescens NOT DETECTED NOT DETECTED Final   Haemophilus influenzae NOT DETECTED NOT DETECTED Final   Neisseria meningitidis NOT DETECTED NOT DETECTED Final   Pseudomonas aeruginosa NOT DETECTED NOT DETECTED Final   Candida albicans NOT DETECTED NOT DETECTED Final   Candida glabrata NOT DETECTED NOT DETECTED Final   Candida krusei NOT DETECTED NOT DETECTED Final   Candida parapsilosis NOT DETECTED NOT DETECTED Final   Candida tropicalis NOT DETECTED NOT DETECTED Final    Comment: Performed at [facility], [address] [place], [place] [address]  Blood Culture (routine x 2)     Status: None (Preliminary result)   Collection Time: 12/17/16 12:48 PM  Result Value Ref Range Status   Specimen Description BLOOD RIGHT ARM  Final   Special Requests   Final    BOTTLES DRAWN AEROBIC AND ANAEROBIC Blood Culture adequate volume   Culture NO GROWTH 2 DAYS  Final   Report Status PENDING  Incomplete  Blood Culture (routine x 2)     Status: None (Preliminary result)   Collection Time: 12/17/16 12:53 PM  Result Value Ref Range Status   Specimen Description BLOOD LEFT ARM  Final   Special Requests   Final    BOTTLES DRAWN AEROBIC AND ANAEROBIC Blood Culture adequate volume   Culture NO GROWTH 2 DAYS  Final   Report Status PENDING  Incomplete  MRSA PCR Screening     Status: Abnormal   Collection Time: 12/17/16  7:22 PM  Result Value Ref Range Status   MRSA by PCR POSITIVE (A) NEGATIVE Final    Comment:        The GeneXpert MRSA Assay (FDA approved for NASAL specimens only), is one component of a comprehensive MRSA colonization surveillance  program. It is not intended to diagnose MRSA infection nor to guide or monitor treatment for MRSA infections.  RESULT CALLED TO, READ BACK BY AND VERIFIED WITH: C.MARSHALL,RN AT 0314 BY L.PITT 12/18/16      Labs: Basic Metabolic Panel:  Recent Labs Lab 12/15/16 2130 12/17/16 1247 12/18/16 0651 12/19/16 0407  NA 132* 133* 131* 130*  K 4.1 4.1 4.1 3.6  CL 103 103 104 102  CO2 21* 23 21* 24  GLUCOSE 105* 101* 96 89  BUN 14 13 8 10   CREATININE 0.98 0.89 0.90 1.05  CALCIUM 8.2* 8.5* 7.9* 7.9*   Liver Function Tests:  Recent Labs Lab 12/15/16 2130 12/17/16 1247  AST 40 39  ALT 24 24  ALKPHOS 136* 148*  BILITOT 0.9 1.0  PROT 5.8* 6.1*  ALBUMIN 3.1* 3.1*   No results for input(s): LIPASE, AMYLASE in the last 168 hours. No results for input(s): AMMONIA in the last 168 hours. CBC:  Recent Labs Lab 12/15/16 2130 12/17/16 1247 12/18/16 0651 12/19/16 0407  WBC 3.0* 3.2* 4.0 4.8  NEUTROABS 1.7 1.5*  --  2.0  HGB 11.8* 12.4* 10.7* 10.6*  HCT 35.2* 36.8* 32.3* 31.5*  MCV 99.7 98.7 99.4 98.4  PLT 47* 45* 54* 59*   Cardiac Enzymes: No results for input(s): CKTOTAL, CKMB, CKMBINDEX, TROPONINI in the last 168 hours. BNP: BNP (last 3 results) No results for input(s): BNP in the last 8760 hours.  ProBNP (last 3 results) No results for input(s): PROBNP in the last 8760 hours.  CBG: No results for input(s): GLUCAP in the last 168 hours.     SignedRhetta Mura MD   [facility] 12/20/2016, 9:59 AM

## 2016-12-20 NOTE — Telephone Encounter (Signed)
Post ED Visit - Positive Culture Follow-up  Culture report reviewed by antimicrobial stewardship pharmacist:  []  Enzo BiNathan Batchelder, Pharm.D. []  Celedonio MiyamotoJeremy Frens, Pharm.D., BCPS AQ-ID []  Garvin FilaMike Maccia, Pharm.D., BCPS []  Georgina PillionElizabeth Martin, Pharm.D., BCPS []  FontanaMinh Pham, [address]Pharm.D., BCPS, AAHIVP []  Estella HuskMichelle Turner, Pharm.D., BCPS, AAHIVP []  Lysle Pearlachel Rumbarger, PharmD, BCPS []  Casilda Carlsaylor Stone, PharmD, BCPS []  Pollyann SamplesAndy Johnston, PharmD, BCPS Louie CasaJennifer Markle Pharm [facility] Treated with Doxycycline, organism sensitive to the same and no further patient follow-up is required at this time.  Jerry CarasCullom, Tiffine Henigan Burnett 12/20/2016, 1:17 PM

## 2016-12-20 NOTE — Progress Notes (Signed)
Pt prepared for d/c to SNF. IV d/c'd. Skin intact except as charted in most recent assessments. Vitals are stable. [facility] [place] [facility] Pt to be transported by ambulance service.  Peri MarisAndrew Axle Parfait, MBA, BSN, RN

## 2016-12-20 NOTE — Progress Notes (Signed)
Spoke with social worker @ [phone], requesting the SW confirm with [facility] [place], that SNF will be able to arrange opthalmology follow-up.  SW stated that she would start working on this.  Trevor MarisAndrew Letoya Brock, MBA, BSN, RN

## 2016-12-20 NOTE — Progress Notes (Signed)
Clinical Social Worker facilitated patient discharge including contacting patient family and facility to confirm patient discharge plans.  Clinical information faxed to facility and family agreeable with plan.  CSW arranged [facility] [place].  RN to call [phone] (pt will go back in rm# [phone], please ask for RN that has that room) for report prior to discharge.  Clinical Social Worker will sign off for now as social work intervention is no longer needed. Please consult us again if new need arises.  Marrianne MoodAshley Cheo Selvey, MSW, [employer] IncLCSWA [phone]

## 2016-12-22 LAB — CULTURE, BLOOD (ROUTINE X 2)
CULTURE: NO GROWTH
Culture: NO GROWTH
SPECIAL REQUESTS: ADEQUATE
Special Requests: ADEQUATE

## 2016-12-23 DIAGNOSIS — B029 Zoster without complications: Secondary | ICD-10-CM | POA: Diagnosis not present

## 2016-12-23 DIAGNOSIS — G301 Alzheimer's disease with late onset: Secondary | ICD-10-CM | POA: Diagnosis not present

## 2016-12-24 ENCOUNTER — Other Ambulatory Visit: Payer: Self-pay

## 2016-12-24 DIAGNOSIS — M6281 Muscle weakness (generalized): Secondary | ICD-10-CM | POA: Diagnosis not present

## 2016-12-24 DIAGNOSIS — R011 Cardiac murmur, unspecified: Secondary | ICD-10-CM | POA: Diagnosis not present

## 2016-12-24 DIAGNOSIS — L03211 Cellulitis of face: Secondary | ICD-10-CM | POA: Diagnosis not present

## 2016-12-24 DIAGNOSIS — B029 Zoster without complications: Secondary | ICD-10-CM | POA: Diagnosis not present

## 2016-12-24 DIAGNOSIS — R1310 Dysphagia, unspecified: Secondary | ICD-10-CM | POA: Diagnosis not present

## 2016-12-24 DIAGNOSIS — S6291XA Unspecified fracture of right wrist and hand, initial encounter for closed fracture: Secondary | ICD-10-CM | POA: Diagnosis not present

## 2016-12-24 DIAGNOSIS — R293 Abnormal posture: Secondary | ICD-10-CM | POA: Diagnosis not present

## 2016-12-24 DIAGNOSIS — S6291XD Unspecified fracture of right wrist and hand, subsequent encounter for fracture with routine healing: Secondary | ICD-10-CM | POA: Diagnosis not present

## 2016-12-24 DIAGNOSIS — E039 Hypothyroidism, unspecified: Secondary | ICD-10-CM | POA: Diagnosis not present

## 2016-12-24 DIAGNOSIS — D696 Thrombocytopenia, unspecified: Secondary | ICD-10-CM | POA: Diagnosis not present

## 2016-12-24 DIAGNOSIS — H16143 Punctate keratitis, bilateral: Secondary | ICD-10-CM | POA: Diagnosis not present

## 2016-12-24 DIAGNOSIS — R2681 Unsteadiness on feet: Secondary | ICD-10-CM | POA: Diagnosis not present

## 2016-12-24 DIAGNOSIS — D61818 Other pancytopenia: Secondary | ICD-10-CM | POA: Diagnosis not present

## 2016-12-24 DIAGNOSIS — E785 Hyperlipidemia, unspecified: Secondary | ICD-10-CM | POA: Diagnosis not present

## 2016-12-24 NOTE — Patient Outreach (Signed)
[facility] Care Management  12/24/2016  Trevor Brock 1932-12-30 [id number]  Transition of care  Week # 1 Referral date: 12/23/16 Referral source: transition of care status [facility] 12/20/16 Insurance: [employer]  Telephone call to patient regarding transition of care follow.  Contact answering phone states patient is residing in [facility] [place].  PLAN: RNCM will refer patient to care management assistant to close due to patient being enrolled in an external program RNCM will notify patients primary MD of closure.   George InaDavina Tanazia Achee RN,BSN,CCM [facility] Telephonic  [phone]

## 2017-01-08 DIAGNOSIS — G301 Alzheimer's disease with late onset: Secondary | ICD-10-CM | POA: Diagnosis not present

## 2017-01-08 DIAGNOSIS — B029 Zoster without complications: Secondary | ICD-10-CM | POA: Diagnosis not present

## 2017-01-12 DIAGNOSIS — L84 Corns and callosities: Secondary | ICD-10-CM | POA: Diagnosis not present

## 2017-01-12 DIAGNOSIS — M216X2 Other acquired deformities of left foot: Secondary | ICD-10-CM | POA: Diagnosis not present

## 2017-01-12 DIAGNOSIS — M2042 Other hammer toe(s) (acquired), left foot: Secondary | ICD-10-CM | POA: Diagnosis not present

## 2017-01-12 DIAGNOSIS — B351 Tinea unguium: Secondary | ICD-10-CM | POA: Diagnosis not present

## 2017-01-18 ENCOUNTER — Encounter: Payer: Self-pay | Admitting: Orthopedic Surgery

## 2017-01-18 ENCOUNTER — Ambulatory Visit (INDEPENDENT_AMBULATORY_CARE_PROVIDER_SITE_OTHER): Payer: Medicare Other | Admitting: Orthopedic Surgery

## 2017-01-18 DIAGNOSIS — S62111A Displaced fracture of triquetrum [cuneiform] bone, right wrist, initial encounter for closed fracture: Secondary | ICD-10-CM | POA: Diagnosis not present

## 2017-01-18 NOTE — Progress Notes (Signed)
This is an 83 year old male he is residing at a local nursing home. He sustained a triquetral fracture this is a follow-up visit  Chief Complaint  Patient presents with  . Follow-up    right hand fracture    The brace was removed the examination shows no tenderness at the fracture site he has a chronically contracted metacarpophalangeal joints of all his lesser digits and he did have discomfort when we tried to extend that but wrist extension and palpation causes no pain or tenderness  Encounter Diagnosis  Name Primary?  . Triquetral chip fracture, right, closed, initial encounter Yes    He can be released in the brace can be removed

## 2017-01-18 NOTE — Patient Instructions (Signed)
Brace remove

## 2017-02-04 DIAGNOSIS — E785 Hyperlipidemia, unspecified: Secondary | ICD-10-CM | POA: Diagnosis not present

## 2017-02-04 DIAGNOSIS — G301 Alzheimer's disease with late onset: Secondary | ICD-10-CM | POA: Diagnosis not present

## 2017-03-19 DIAGNOSIS — E785 Hyperlipidemia, unspecified: Secondary | ICD-10-CM | POA: Diagnosis not present

## 2017-03-19 DIAGNOSIS — G301 Alzheimer's disease with late onset: Secondary | ICD-10-CM | POA: Diagnosis not present

## 2017-03-22 DIAGNOSIS — R609 Edema, unspecified: Secondary | ICD-10-CM | POA: Diagnosis not present

## 2017-04-02 DIAGNOSIS — R21 Rash and other nonspecific skin eruption: Secondary | ICD-10-CM | POA: Diagnosis not present

## 2017-04-15 DIAGNOSIS — E785 Hyperlipidemia, unspecified: Secondary | ICD-10-CM | POA: Diagnosis not present

## 2017-04-15 DIAGNOSIS — G301 Alzheimer's disease with late onset: Secondary | ICD-10-CM | POA: Diagnosis not present

## 2017-04-17 DIAGNOSIS — M216X2 Other acquired deformities of left foot: Secondary | ICD-10-CM | POA: Diagnosis not present

## 2017-04-17 DIAGNOSIS — L84 Corns and callosities: Secondary | ICD-10-CM | POA: Diagnosis not present

## 2017-04-17 DIAGNOSIS — I739 Peripheral vascular disease, unspecified: Secondary | ICD-10-CM | POA: Diagnosis not present

## 2017-04-17 DIAGNOSIS — B351 Tinea unguium: Secondary | ICD-10-CM | POA: Diagnosis not present

## 2017-04-26 DIAGNOSIS — B079 Viral wart, unspecified: Secondary | ICD-10-CM | POA: Diagnosis not present

## 2017-07-17 IMAGING — MR MR HEAD WO/W CM
10 of 13 series · 33 of 48 positions shown · IV contrast (multihance)
Comparison: CT head September 28, 2015

CLINICAL DATA: Syncopal episode at 4 p.m., fell in bathroom with
seizure like symptoms. History of dementia, hypothyroidism and
hyperlipidemia.

EXAM:
MRI HEAD WITHOUT AND WITH CONTRAST
TECHNIQUE: Multiplanar, multiecho pulse sequences of the brain and surrounding
structures were obtained without and with intravenous contrast.
CONTRAST:  20mL MULTIHANCE GADOBENATE DIMEGLUMINE 529 MG/ML IV SOLN

[Series 3: T1 · sagittal · 5.0mm · 0.47mm/px · 2 of 25 slices shown]
[im 1/25]
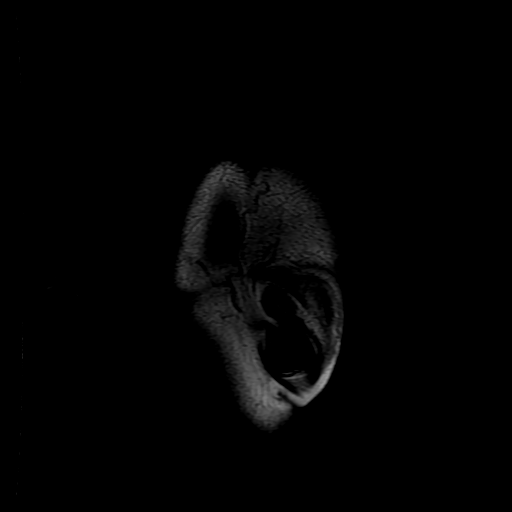
[im 13/25]
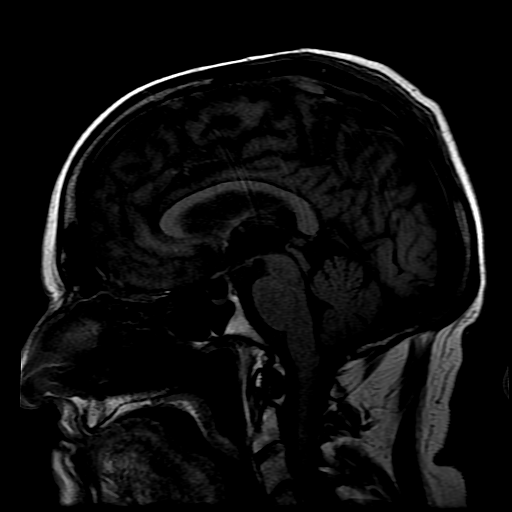

[Series 4: DWI · axial · 3.0mm · 1.09mm/px · z∈[-31,+113]mm · 8 of 98 slices shown (1 of 4)]
[im 1/98]
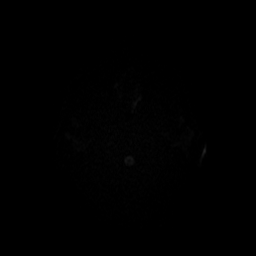
[im 14/98]
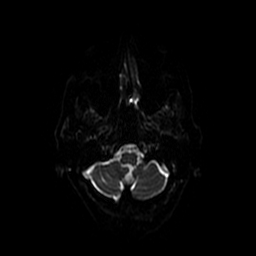
[im 28/98]
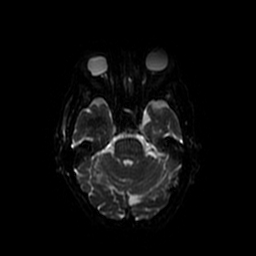
[im 42/98]
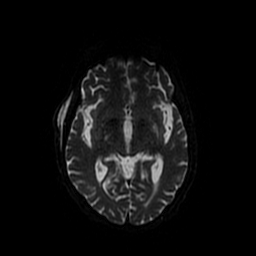
[im 56/98]
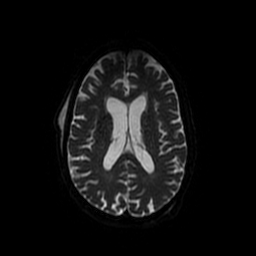
[im 70/98]
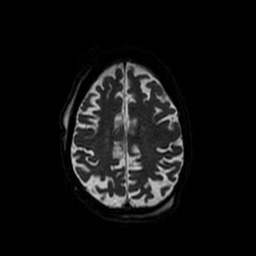
[im 84/98]
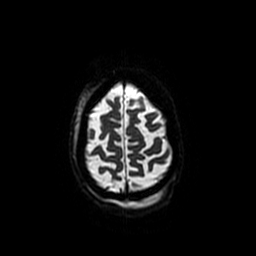
[im 98/98]
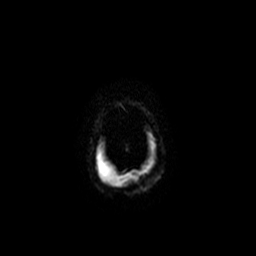

[Series 5: DWI · coronal · 5.0mm · 1.09mm/px · 6 of 74 slices shown (2 of 4)]
[im 1/74]
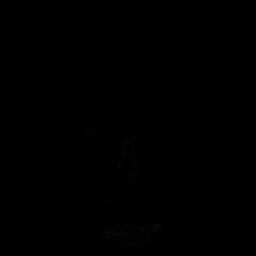
[im 15/74]
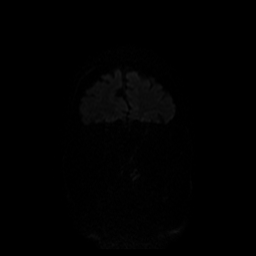
[im 30/74]
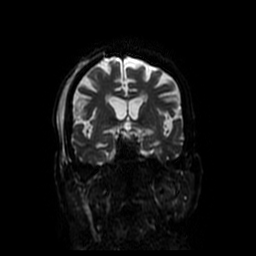
[im 44/74]
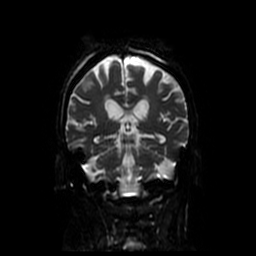
[im 59/74]
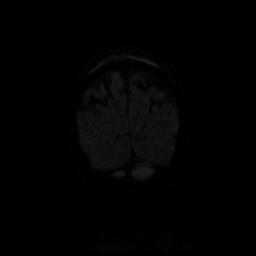
[im 74/74]
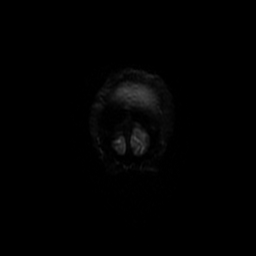

[Series 6: T2 · axial · 5.0mm · 0.47mm/px · z∈[-31,+113]mm · 2 of 25 slices shown (1 of 3)]
[im 1/25]
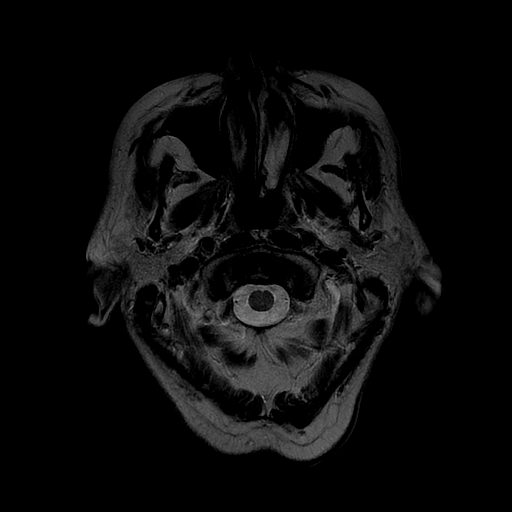
[im 25/25]
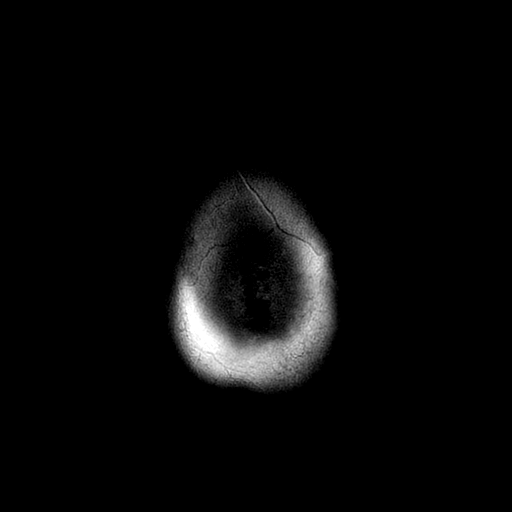

[Series 7: FLAIR · axial · 5.0mm · 0.47mm/px · z∈[-31,+113]mm · 2 of 25 slices shown]
[im 1/25]
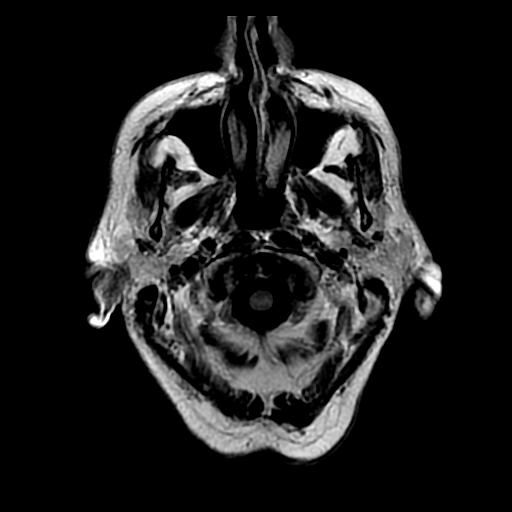
[im 25/25]
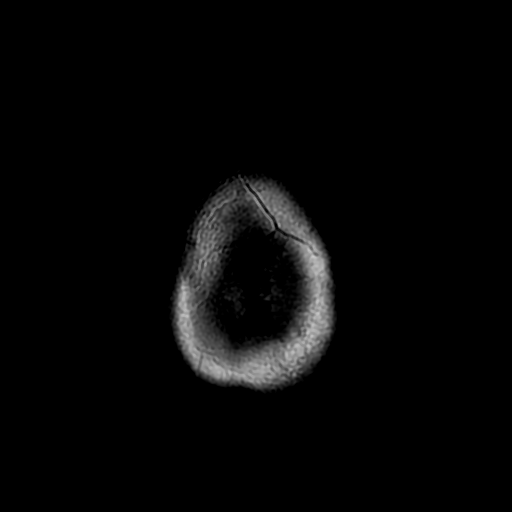

[Series 10: T2 · coronal · 5.0mm · 0.43mm/px · 2 of 31 slices shown (2 of 3)]
[im 1/31]
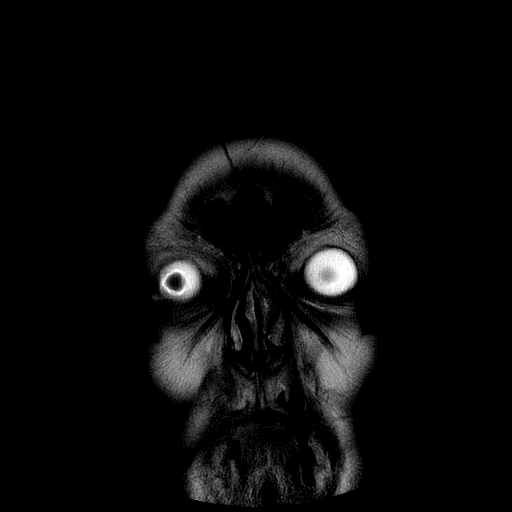
[im 31/31]
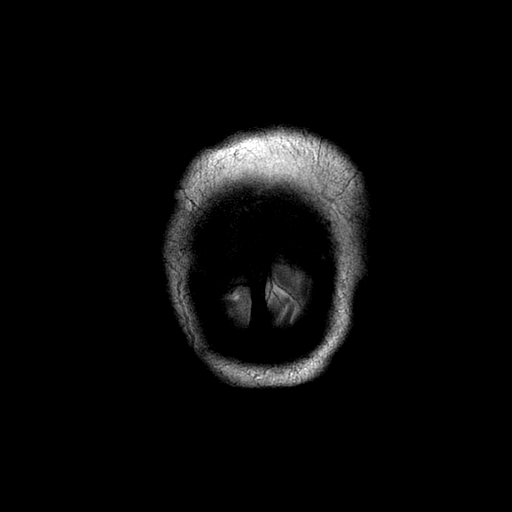

[Series 12: T1 post-contrast · coronal · 5.0mm · 0.43mm/px · 2 of 31 slices shown]
[im 1/31]
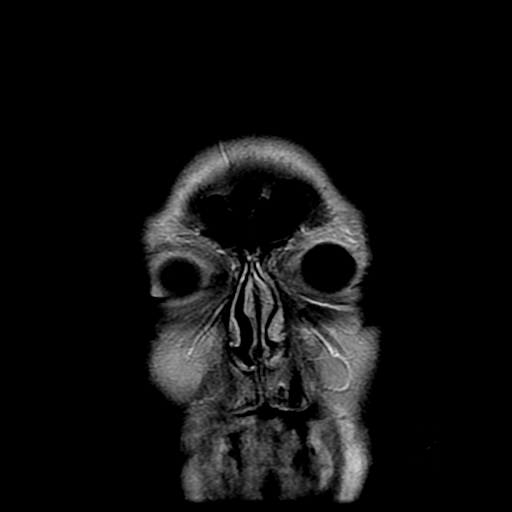
[im 31/31]
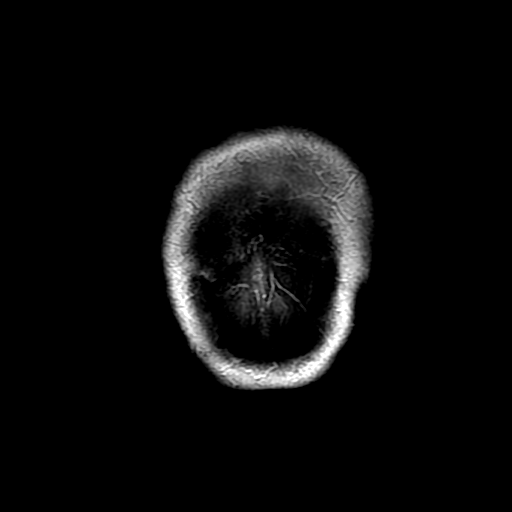

[Series 13: T2 · coronal · 3.0mm · 0.35mm/px · 2 of 29 slices shown (3 of 3)]
[im 1/29]
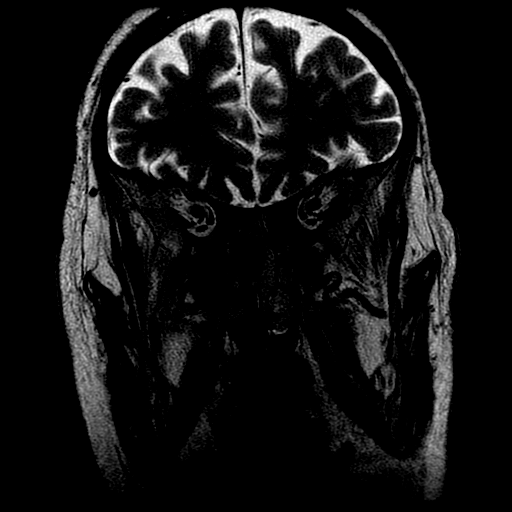
[im 29/29]
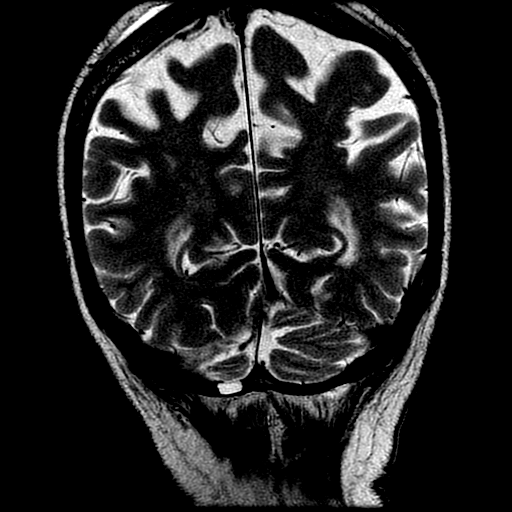

[Series 400: DWI · axial · 3.0mm · 1.09mm/px · z∈[-31,+113]mm · 4 of 49 slices shown (3 of 4)]
[im 1/49]
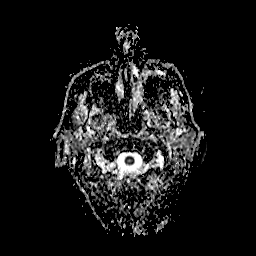
[im 17/49]
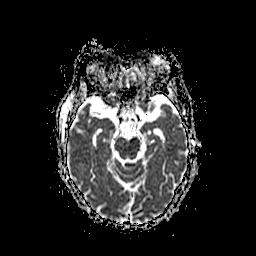
[im 33/49]
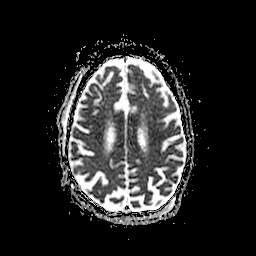
[im 49/49]
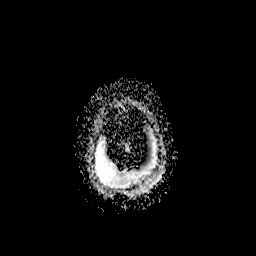

[Series 500: DWI · coronal · 5.0mm · 1.09mm/px · 3 of 37 slices shown (4 of 4)]
[im 1/37]
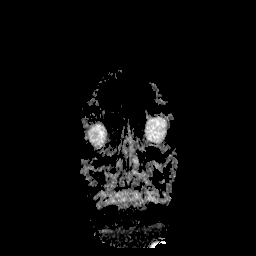
[im 19/37]
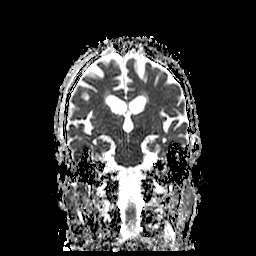
[im 37/37]
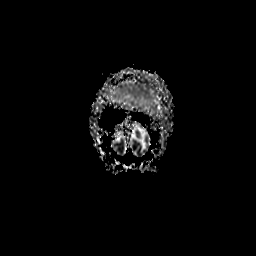

[33 of 48 positions shown; findings below may reference images not displayed]

FINDINGS: The ventricles and sulci are normal for patient's age. No abnormal
parenchymal signal, mass lesions, mass effect. Mild patchy
supratentorial white matter FLAIR T2 hyperintensities. Old small
RIGHT cerebellar infarcts. No abnormal parenchymal enhancement. No
reduced diffusion to suggest acute ischemia. No susceptibility
artifact to suggest hemorrhage. Symmetric size, morphology and
signal of the hippocampi.

No abnormal extra-axial fluid collections. No extra-axial masses nor
leptomeningeal enhancement. Normal major intracranial vascular flow
voids seen at the skull base.

Status post RIGHT ocular globe scleral banding. No suspicious
calvarial bone marrow signal. No abnormal sellar expansion.
Craniocervical junction maintained. Visualized paranasal sinuses and
mastoid air cells are well-aerated. Scalp soft tissue swelling.
IMPRESSION: No acute intracranial process.

Mild chronic small vessel ischemic disease and old small RIGHT
cerebellar infarcts.

Scalp soft tissue swelling.

## 2018-09-21 IMAGING — DX DG WRIST COMPLETE 3+V*R*
4 series · 4 of 4 positions shown · non-contrast
Comparison: None.

CLINICAL DATA: Right wrist pain after fall from bed. Wrist
swelling.

EXAM:
RIGHT WRIST - COMPLETE 3+ VIEW

[wrist pa]
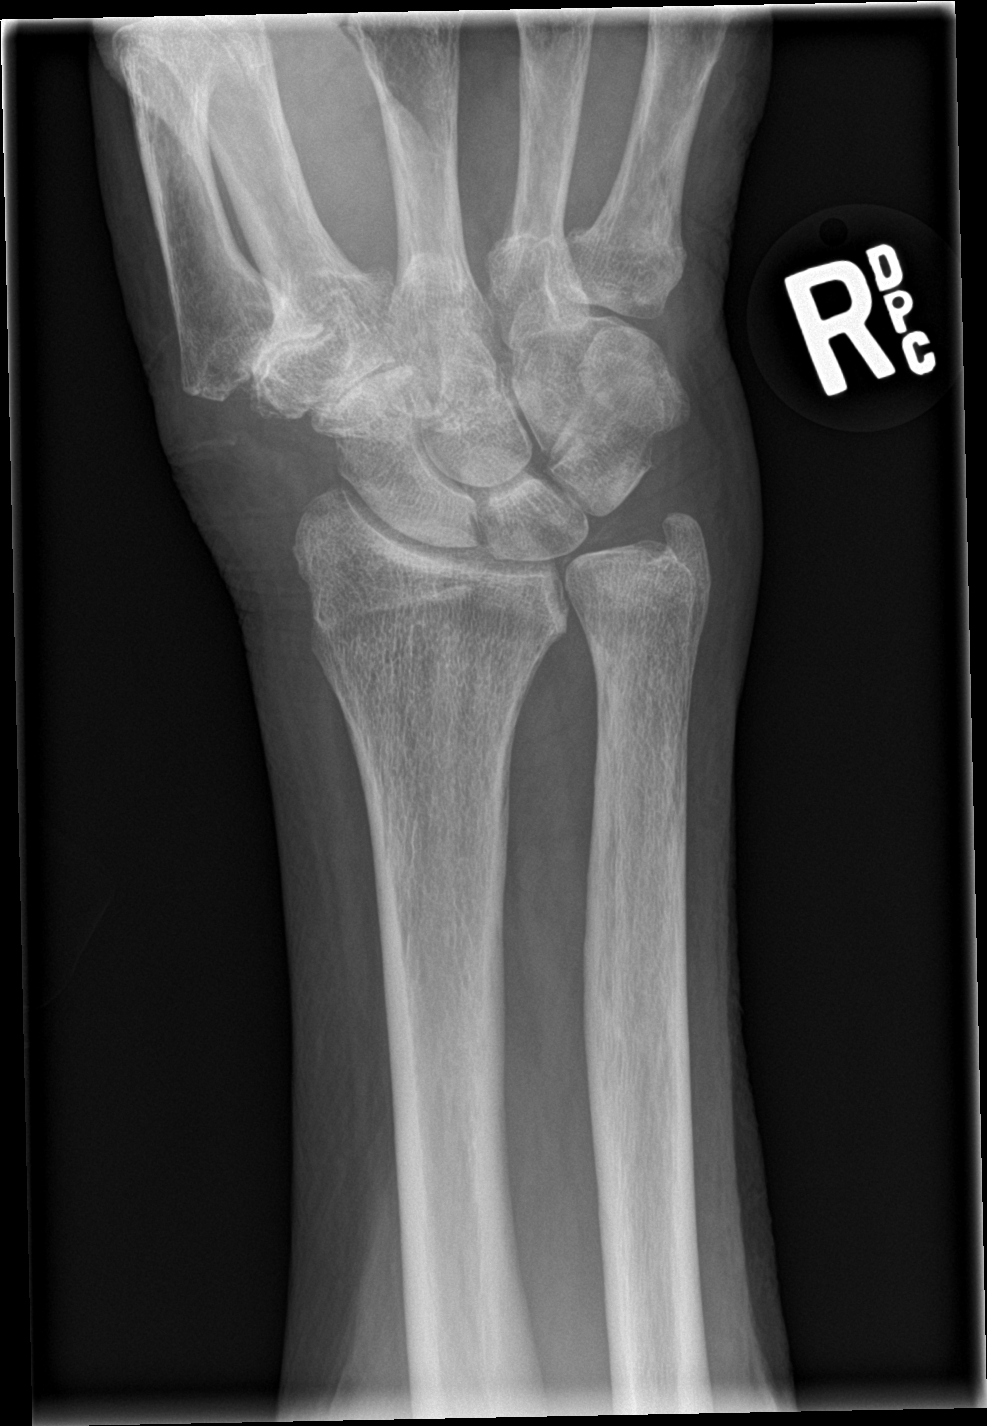

[wrist obl]
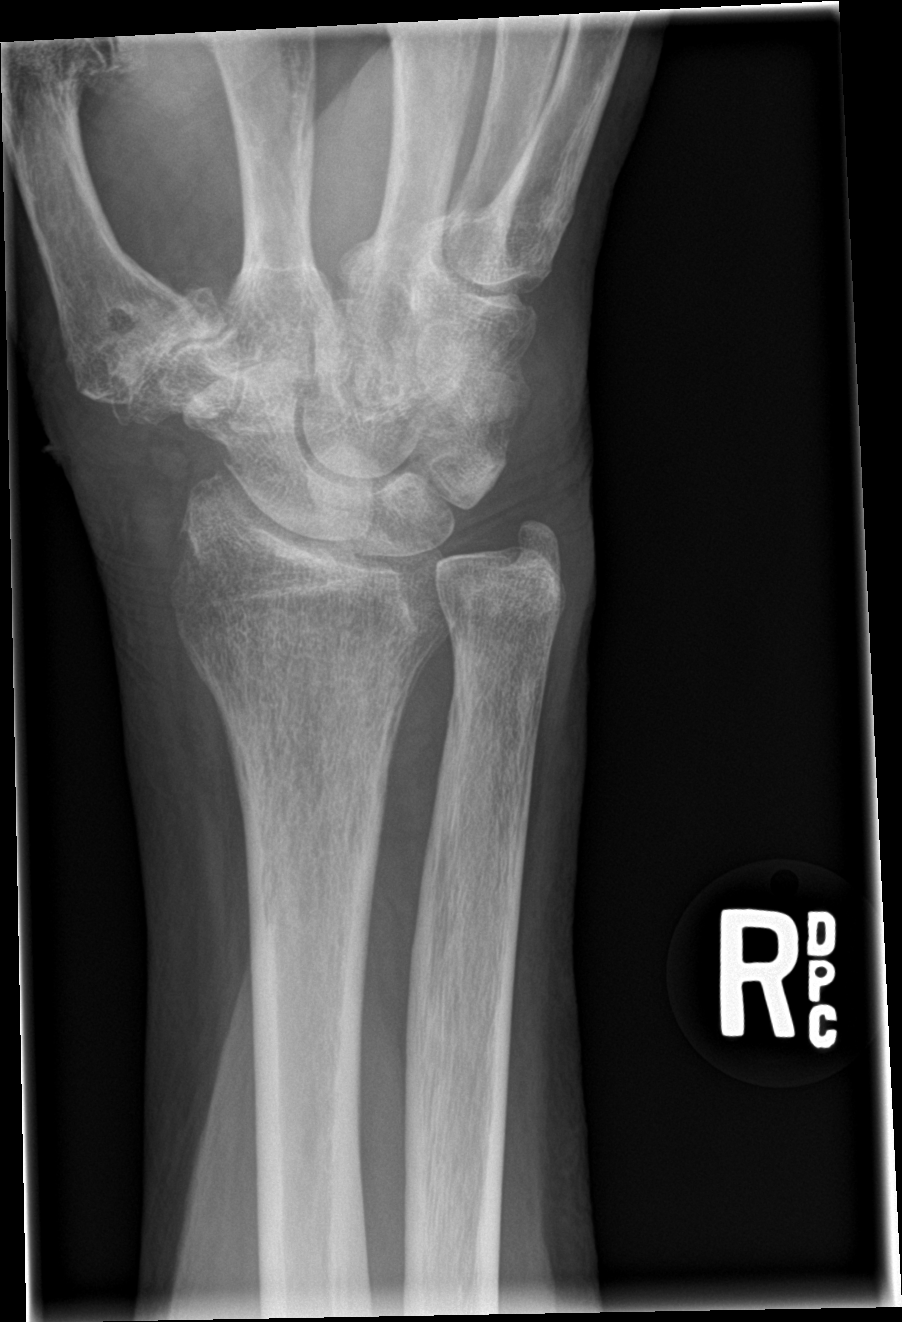

[wrist lat]
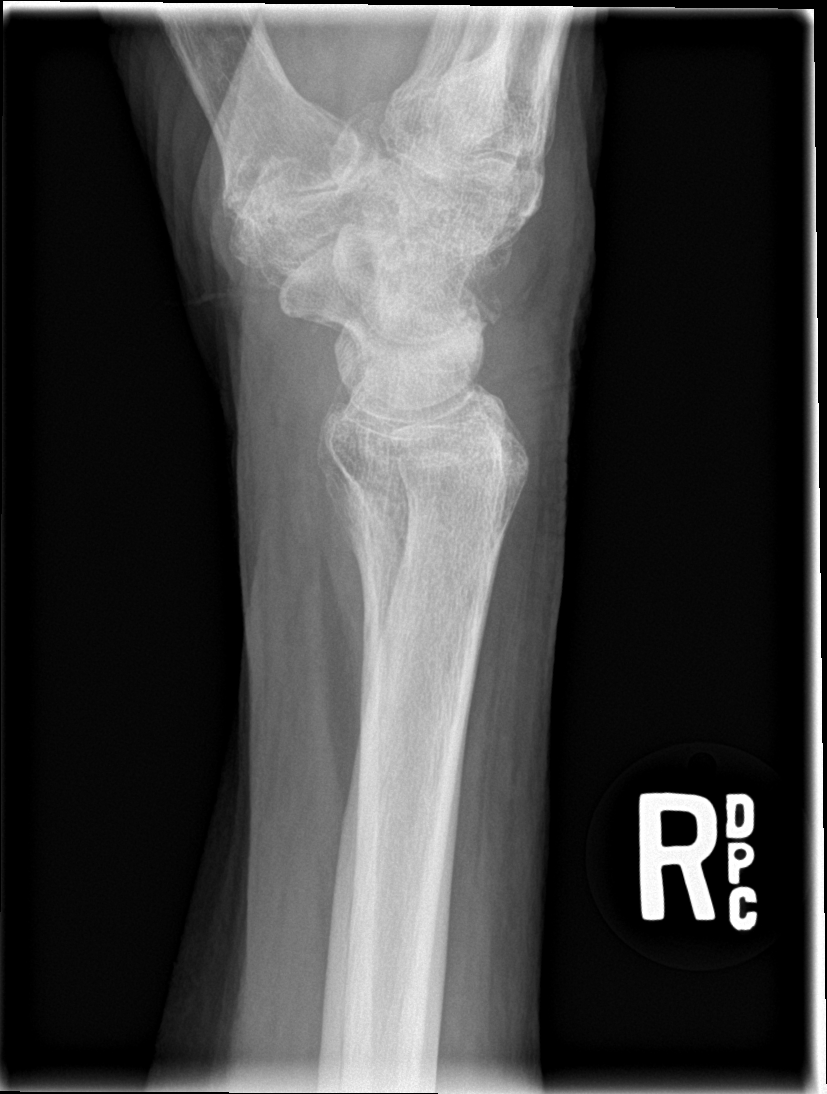

[wrist navicular]
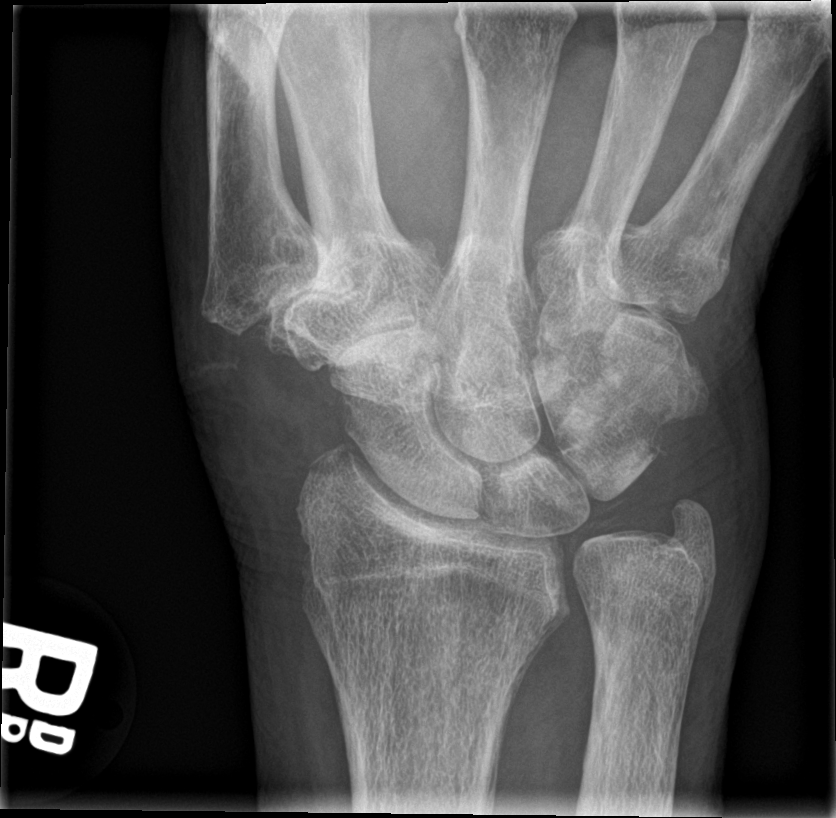

[4 of 4 positions shown; findings below may reference images not displayed]

FINDINGS: Suspected triquetral fracture about the dorsal aspect of the
proximal carpal row in, appreciated on the lateral view. No evidence
of additional fracture. Mild radiocarpal osteoarthritis.
Degenerative change at the base of the thumb. There is mild soft
tissue edema.
IMPRESSION: Suspect acute triquetral fracture with soft tissue edema.

## 2019-10-28 DEATH — deceased
